# Patient Record
Sex: Male | Born: 1960
Health system: Southern US, Community
[De-identification: ages and names within clinical notes are randomized; demographics above are authoritative.]

## PROBLEM LIST (undated history)

## (undated) DIAGNOSIS — R4586 Emotional lability: Secondary | ICD-10-CM

## (undated) DIAGNOSIS — E785 Hyperlipidemia, unspecified: Secondary | ICD-10-CM

## (undated) DIAGNOSIS — G4733 Obstructive sleep apnea (adult) (pediatric): Secondary | ICD-10-CM

## (undated) DIAGNOSIS — C44301 Unspecified malignant neoplasm of skin of nose: Secondary | ICD-10-CM

## (undated) DIAGNOSIS — E119 Type 2 diabetes mellitus without complications: Secondary | ICD-10-CM

## (undated) DIAGNOSIS — Z8042 Family history of malignant neoplasm of prostate: Secondary | ICD-10-CM

## (undated) DIAGNOSIS — N529 Male erectile dysfunction, unspecified: Secondary | ICD-10-CM

## (undated) DIAGNOSIS — I10 Essential (primary) hypertension: Secondary | ICD-10-CM

## (undated) DIAGNOSIS — G4700 Insomnia, unspecified: Secondary | ICD-10-CM

## (undated) DIAGNOSIS — M545 Low back pain, unspecified: Secondary | ICD-10-CM

## (undated) HISTORY — DX: Obstructive sleep apnea (adult) (pediatric): G47.33

## (undated) HISTORY — DX: Emotional lability: R45.86

## (undated) HISTORY — DX: Family history of malignant neoplasm of prostate: Z80.42

## (undated) HISTORY — DX: Low back pain: M54.5

## (undated) HISTORY — DX: Insomnia, unspecified: G47.00

## (undated) HISTORY — DX: Low back pain, unspecified: M54.50

## (undated) HISTORY — DX: Essential (primary) hypertension: I10

## (undated) HISTORY — PX: OTHER SURGICAL HISTORY: SHX169

## (undated) HISTORY — DX: Type 2 diabetes mellitus without complications: E11.9

## (undated) HISTORY — PX: LUMBAR FUSION: SHX111

## (undated) HISTORY — DX: Unspecified malignant neoplasm of skin of nose: C44.301

## (undated) HISTORY — PX: LUMBAR LAMINECTOMY: SHX95

## (undated) HISTORY — DX: Male erectile dysfunction, unspecified: N52.9

## (undated) HISTORY — DX: Hyperlipidemia, unspecified: E78.5

## (undated) HISTORY — PX: ARTHROSCOPY KNEE W/ DRILLING: SUR92

## (undated) HISTORY — PX: SKIN CANCER EXCISION: SHX779

---

## 2000-11-25 ENCOUNTER — Ambulatory Visit (HOSPITAL_BASED_OUTPATIENT_CLINIC_OR_DEPARTMENT_OTHER): Admission: RE | Admit: 2000-11-25 | Discharge: 2000-11-25 | Payer: Self-pay | Admitting: Critical Care Medicine

## 2004-09-09 ENCOUNTER — Ambulatory Visit: Payer: Self-pay | Admitting: Internal Medicine

## 2004-10-01 ENCOUNTER — Emergency Department (HOSPITAL_COMMUNITY): Admission: EM | Admit: 2004-10-01 | Discharge: 2004-10-01 | Payer: Self-pay | Admitting: Emergency Medicine

## 2004-10-13 ENCOUNTER — Ambulatory Visit: Payer: Self-pay | Admitting: Internal Medicine

## 2004-12-08 ENCOUNTER — Ambulatory Visit: Payer: Self-pay | Admitting: Internal Medicine

## 2005-03-02 ENCOUNTER — Ambulatory Visit: Payer: Self-pay | Admitting: Internal Medicine

## 2005-03-09 ENCOUNTER — Ambulatory Visit: Payer: Self-pay | Admitting: Internal Medicine

## 2005-03-22 ENCOUNTER — Encounter: Admission: RE | Admit: 2005-03-22 | Discharge: 2005-06-20 | Payer: Self-pay | Admitting: Internal Medicine

## 2005-05-31 ENCOUNTER — Ambulatory Visit: Payer: Self-pay | Admitting: Internal Medicine

## 2005-06-07 ENCOUNTER — Ambulatory Visit: Payer: Self-pay | Admitting: Internal Medicine

## 2005-07-01 ENCOUNTER — Encounter: Admission: RE | Admit: 2005-07-01 | Discharge: 2005-08-15 | Payer: Self-pay | Admitting: Internal Medicine

## 2005-09-07 ENCOUNTER — Ambulatory Visit: Payer: Self-pay | Admitting: Internal Medicine

## 2005-09-14 ENCOUNTER — Ambulatory Visit: Payer: Self-pay | Admitting: Internal Medicine

## 2006-04-11 ENCOUNTER — Ambulatory Visit: Payer: Self-pay | Admitting: Internal Medicine

## 2006-04-22 ENCOUNTER — Ambulatory Visit: Payer: Self-pay | Admitting: Internal Medicine

## 2006-08-16 DIAGNOSIS — C44301 Unspecified malignant neoplasm of skin of nose: Secondary | ICD-10-CM

## 2006-08-16 HISTORY — DX: Unspecified malignant neoplasm of skin of nose: C44.301

## 2006-10-14 ENCOUNTER — Ambulatory Visit: Payer: Self-pay | Admitting: Internal Medicine

## 2006-10-14 LAB — CONVERTED CEMR LAB
AST: 26 units/L (ref 0–37)
Alkaline Phosphatase: 62 units/L (ref 39–117)
BUN: 12 mg/dL (ref 6–23)
Basophils Absolute: 0 10*3/uL (ref 0.0–0.1)
Basophils Relative: 0.5 % (ref 0.0–1.0)
Bilirubin Urine: NEGATIVE
Bilirubin, Direct: 0.1 mg/dL (ref 0.0–0.3)
CO2: 29 meq/L (ref 19–32)
Calcium: 8.5 mg/dL (ref 8.4–10.5)
Chloride: 103 meq/L (ref 96–112)
GFR calc Af Amer: 116 mL/min
Glucose, Bld: 177 mg/dL — ABNORMAL HIGH (ref 70–99)
HDL: 34.9 mg/dL — ABNORMAL LOW (ref >39.0)
Hemoglobin: 15.5 g/dL (ref 13.0–17.0)
Hgb A1c MFr Bld: 7.3 % — ABNORMAL HIGH (ref 4.6–6.0)
Leukocytes, UA: NEGATIVE
Lymphocytes Relative: 29.3 % (ref 12.0–46.0)
MCHC: 34.7 g/dL (ref 30.0–36.0)
Monocytes Absolute: 0.5 10*3/uL (ref 0.2–0.7)
PSA: 0.56 ng/mL (ref 0.10–4.00)
Platelets: 168 10*3/uL (ref 150–400)
Potassium: 4.2 meq/L (ref 3.5–5.1)
Sodium: 137 meq/L (ref 135–145)
Specific Gravity, Urine: 1.02 (ref 1.000–1.03)
TSH: 0.89 microintl units/mL (ref 0.35–5.50)
Total Protein, Urine: NEGATIVE mg/dL
Total Protein: 6.7 g/dL (ref 6.0–8.3)
Triglycerides: 150 mg/dL — ABNORMAL HIGH (ref 0–149)
Urine Glucose: 500 mg/dL — AB
Urobilinogen, UA: 0.2 (ref 0.0–1.0)
pH: 7 (ref 5.0–8.0)

## 2006-10-21 ENCOUNTER — Ambulatory Visit: Payer: Self-pay | Admitting: Internal Medicine

## 2007-02-15 ENCOUNTER — Ambulatory Visit: Payer: Self-pay | Admitting: Internal Medicine

## 2007-02-15 LAB — CONVERTED CEMR LAB
Chloride: 102 meq/L (ref 96–112)
Creatinine, Ser: 0.8 mg/dL (ref 0.4–1.5)

## 2007-02-21 ENCOUNTER — Ambulatory Visit: Payer: Self-pay | Admitting: Internal Medicine

## 2007-05-27 ENCOUNTER — Encounter: Payer: Self-pay | Admitting: Internal Medicine

## 2007-06-21 ENCOUNTER — Ambulatory Visit: Payer: Self-pay | Admitting: Internal Medicine

## 2007-06-21 LAB — CONVERTED CEMR LAB
AST: 54 units/L — ABNORMAL HIGH (ref 0–37)
Albumin: 3.9 g/dL (ref 3.5–5.2)
BUN: 13 mg/dL (ref 6–23)
CO2: 30 meq/L (ref 19–32)
Calcium: 8.7 mg/dL (ref 8.4–10.5)
Chloride: 104 meq/L (ref 96–112)
Creatinine, Ser: 0.8 mg/dL (ref 0.4–1.5)
Direct LDL: 143.2 mg/dL
Glucose, Bld: 190 mg/dL — ABNORMAL HIGH (ref 70–99)
HDL: 32.6 mg/dL — ABNORMAL LOW (ref >39.0)
Sodium: 142 meq/L (ref 135–145)
Total Bilirubin: 1 mg/dL (ref 0.3–1.2)
Total CHOL/HDL Ratio: 6.2
Total Protein: 6.8 g/dL (ref 6.0–8.3)
Triglycerides: 177 mg/dL — ABNORMAL HIGH (ref 0–149)
VLDL: 35 mg/dL (ref 0–40)

## 2007-06-27 ENCOUNTER — Ambulatory Visit: Payer: Self-pay | Admitting: Internal Medicine

## 2007-06-27 DIAGNOSIS — N529 Male erectile dysfunction, unspecified: Secondary | ICD-10-CM | POA: Insufficient documentation

## 2007-06-27 DIAGNOSIS — E785 Hyperlipidemia, unspecified: Secondary | ICD-10-CM | POA: Insufficient documentation

## 2007-09-18 ENCOUNTER — Ambulatory Visit: Payer: Self-pay | Admitting: Internal Medicine

## 2007-09-25 ENCOUNTER — Ambulatory Visit: Payer: Self-pay | Admitting: Internal Medicine

## 2007-09-25 DIAGNOSIS — I10 Essential (primary) hypertension: Secondary | ICD-10-CM | POA: Insufficient documentation

## 2007-09-25 LAB — CONVERTED CEMR LAB
ALT: 66 units/L — ABNORMAL HIGH (ref 0–53)
AST: 96 units/L — ABNORMAL HIGH (ref 0–37)
Bilirubin, Direct: 0.2 mg/dL (ref 0.0–0.3)
CO2: 27 meq/L (ref 19–32)
Calcium: 8.8 mg/dL (ref 8.4–10.5)
Cholesterol: 139 mg/dL (ref 0–200)
Creatinine, Ser: 0.8 mg/dL (ref 0.4–1.5)
Glucose, Bld: 229 mg/dL — ABNORMAL HIGH (ref 70–99)
Hgb A1c MFr Bld: 8.8 % — ABNORMAL HIGH (ref 4.6–6.0)
LDL Cholesterol: 81 mg/dL (ref 0–99)
Potassium: 3.3 meq/L — ABNORMAL LOW (ref 3.5–5.1)
Total Protein: 6.5 g/dL (ref 6.0–8.3)

## 2008-01-09 ENCOUNTER — Encounter: Payer: Self-pay | Admitting: Internal Medicine

## 2008-01-09 ENCOUNTER — Ambulatory Visit: Payer: Self-pay | Admitting: Internal Medicine

## 2008-01-09 DIAGNOSIS — J189 Pneumonia, unspecified organism: Secondary | ICD-10-CM | POA: Insufficient documentation

## 2008-01-09 DIAGNOSIS — R05 Cough: Secondary | ICD-10-CM

## 2008-01-09 DIAGNOSIS — R059 Cough, unspecified: Secondary | ICD-10-CM | POA: Insufficient documentation

## 2008-01-09 DIAGNOSIS — R509 Fever, unspecified: Secondary | ICD-10-CM | POA: Insufficient documentation

## 2008-01-22 ENCOUNTER — Ambulatory Visit: Payer: Self-pay | Admitting: Internal Medicine

## 2008-01-22 LAB — CONVERTED CEMR LAB
Calcium: 8.8 mg/dL (ref 8.4–10.5)
Creatinine, Ser: 0.9 mg/dL (ref 0.4–1.5)
GFR calc Af Amer: 116 mL/min
Hgb A1c MFr Bld: 8.7 % — ABNORMAL HIGH (ref 4.6–6.0)
Potassium: 3.7 meq/L (ref 3.5–5.1)

## 2008-01-26 ENCOUNTER — Encounter (INDEPENDENT_AMBULATORY_CARE_PROVIDER_SITE_OTHER): Payer: Self-pay | Admitting: *Deleted

## 2008-01-26 ENCOUNTER — Ambulatory Visit: Payer: Self-pay | Admitting: Internal Medicine

## 2008-01-26 DIAGNOSIS — D485 Neoplasm of uncertain behavior of skin: Secondary | ICD-10-CM | POA: Insufficient documentation

## 2008-02-12 ENCOUNTER — Telehealth: Payer: Self-pay | Admitting: Internal Medicine

## 2008-02-12 ENCOUNTER — Encounter: Payer: Self-pay | Admitting: Internal Medicine

## 2008-02-12 ENCOUNTER — Ambulatory Visit: Payer: Self-pay | Admitting: Cardiology

## 2008-02-12 ENCOUNTER — Ambulatory Visit: Payer: Self-pay | Admitting: Internal Medicine

## 2008-02-12 DIAGNOSIS — R1031 Right lower quadrant pain: Secondary | ICD-10-CM | POA: Insufficient documentation

## 2008-03-01 ENCOUNTER — Encounter: Payer: Self-pay | Admitting: Internal Medicine

## 2008-05-09 ENCOUNTER — Encounter (INDEPENDENT_AMBULATORY_CARE_PROVIDER_SITE_OTHER): Payer: Self-pay | Admitting: *Deleted

## 2008-06-21 ENCOUNTER — Ambulatory Visit: Payer: Self-pay | Admitting: Internal Medicine

## 2008-06-25 LAB — CONVERTED CEMR LAB
Chloride: 106 meq/L (ref 96–112)
GFR calc Af Amer: 115 mL/min

## 2008-06-26 ENCOUNTER — Ambulatory Visit: Payer: Self-pay | Admitting: Internal Medicine

## 2008-10-23 ENCOUNTER — Ambulatory Visit: Payer: Self-pay | Admitting: Internal Medicine

## 2008-10-24 LAB — CONVERTED CEMR LAB
Alkaline Phosphatase: 50 units/L (ref 39–117)
Basophils Absolute: 0 10*3/uL (ref 0.0–0.1)
Bilirubin Urine: NEGATIVE
Bilirubin, Direct: 0.2 mg/dL (ref 0.0–0.3)
Calcium: 8.8 mg/dL (ref 8.4–10.5)
Cholesterol: 130 mg/dL (ref 0–200)
Creatinine, Ser: 0.9 mg/dL (ref 0.4–1.5)
GFR calc Af Amer: 115 mL/min
GFR calc non Af Amer: 95 mL/min
Hemoglobin, Urine: NEGATIVE
Hemoglobin: 14.9 g/dL (ref 13.0–17.0)
Hgb A1c MFr Bld: 7.2 % — ABNORMAL HIGH (ref 4.6–6.0)
Ketones, ur: NEGATIVE mg/dL
LDL Cholesterol: 77 mg/dL (ref 0–99)
Leukocytes, UA: NEGATIVE
Lymphocytes Relative: 29.6 % (ref 12.0–46.0)
Monocytes Relative: 5.1 % (ref 3.0–12.0)
Nitrite: NEGATIVE
Platelets: 154 10*3/uL (ref 150–400)
RDW: 12.1 % (ref 11.5–14.6)
Specific Gravity, Urine: >=1.03 (ref 1.000–1.035)
Total Bilirubin: 1.4 mg/dL — ABNORMAL HIGH (ref 0.3–1.2)
Total CHOL/HDL Ratio: 4.3
Triglycerides: 118 mg/dL (ref 0–149)

## 2008-10-28 ENCOUNTER — Ambulatory Visit: Payer: Self-pay | Admitting: Internal Medicine

## 2009-01-29 IMAGING — CT CT ABDOMEN W/ CM
2 of 5 series · 17 of 46 positions shown, 19 images · IV contrast (omnipaque)
Comparison: None

CT ABDOMEN

CLINICAL DATA: Right lower quadrant pain.  Pain radiates to
suprapubic area.  Nausea, diarrhea for 1 day.  History of L-spine
surgery.

CT ABDOMEN AND PELVIS WITH CONTRAST
TECHNIQUE: Multidetector CT imaging of the abdomen and pelvis was
performed using the standard protocol following bolus
administration of intravenous contrast.
Contrast: 125 ml of Omnipaque-X00.

[Series 2: abd_pel_xxl 5.0 b10f st · axial · 0.92mm/px · z∈[-486,-62]mm · 14 of 97 slices shown, 16 images]
[im 6/97  soft-tissue]
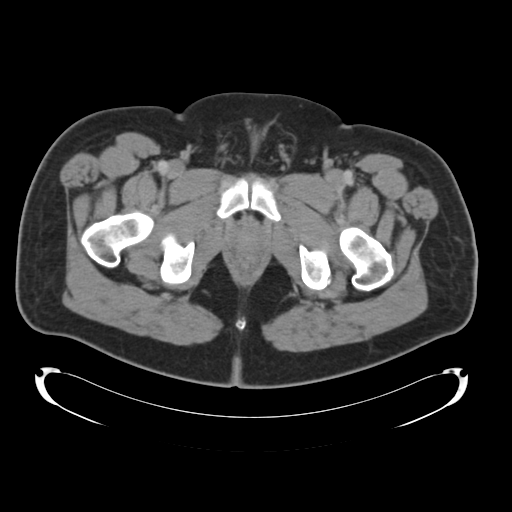
[im 6/97  bone]
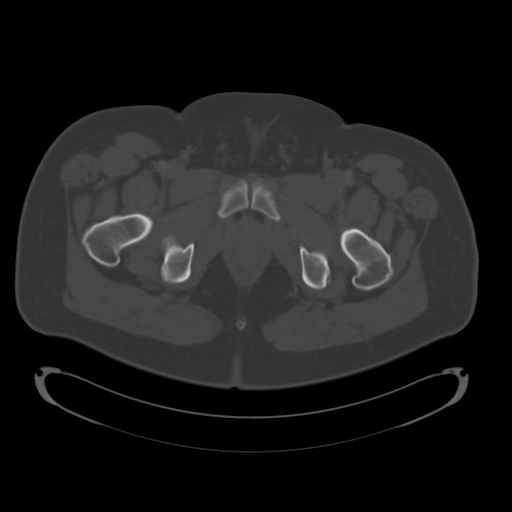
[im 11/97  soft-tissue]
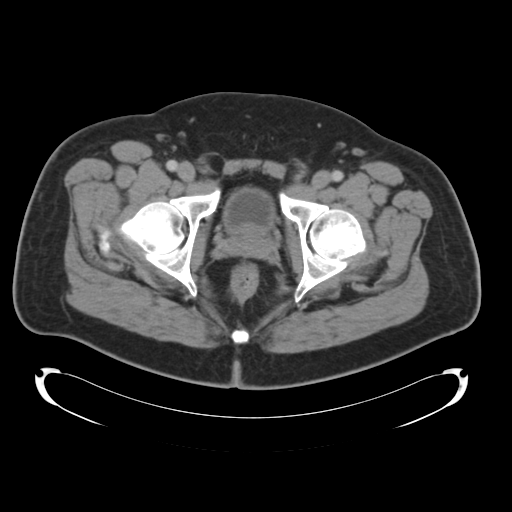
[im 22/97  soft-tissue]
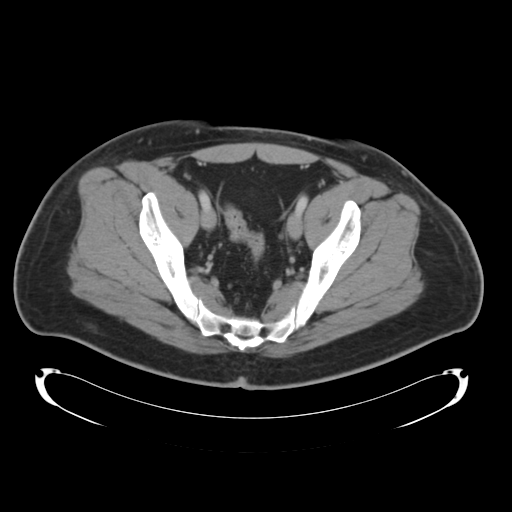
[im 27/97  soft-tissue]
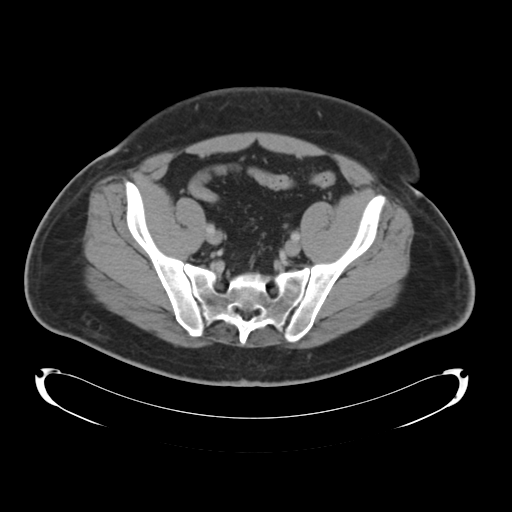
[im 33/97  soft-tissue]
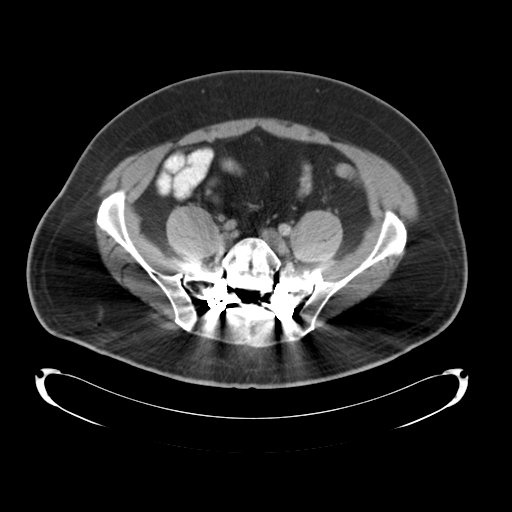
[im 38/97  soft-tissue]
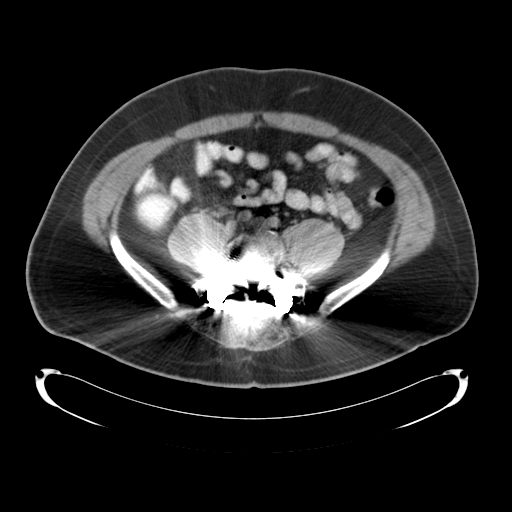
[im 43/97  soft-tissue]
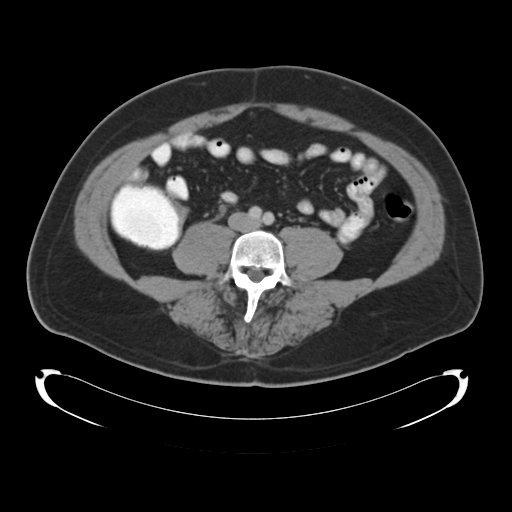
[im 54/97  soft-tissue]
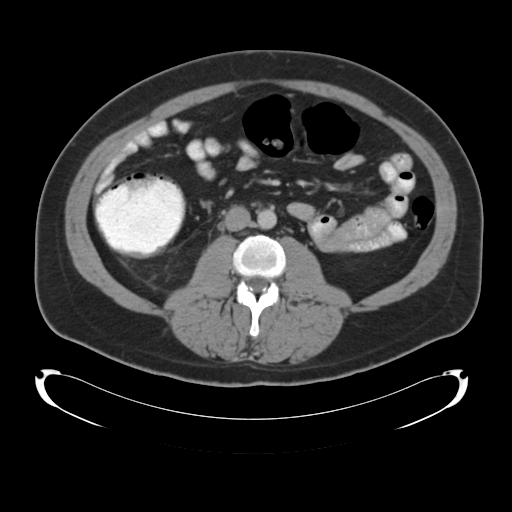
[im 59/97  soft-tissue]
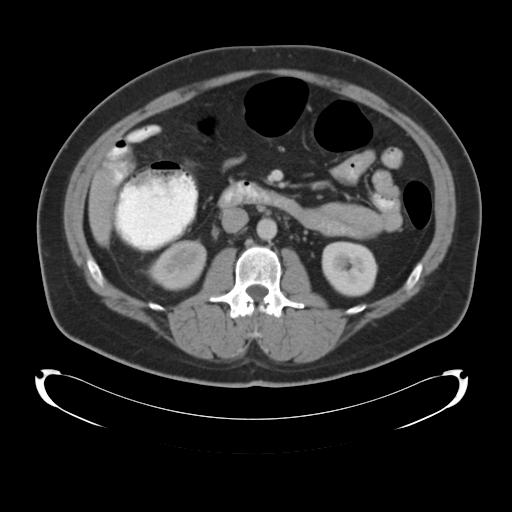
[im 59/97  bone]
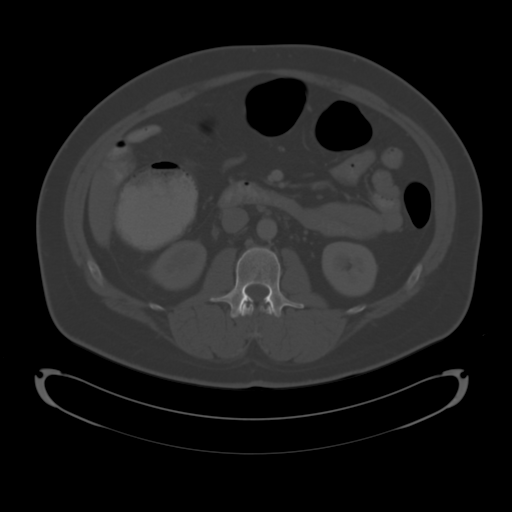
[im 65/97  soft-tissue]
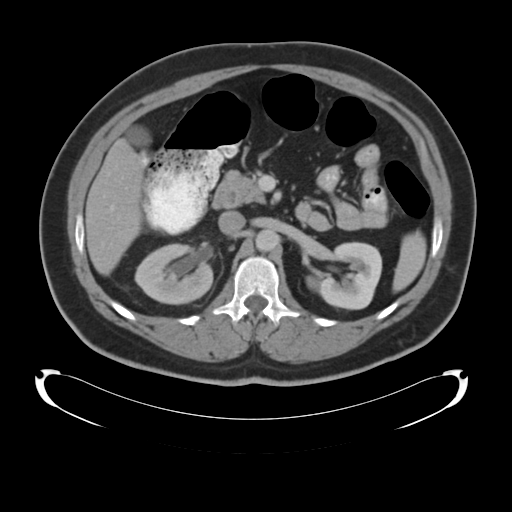
[im 70/97  soft-tissue]
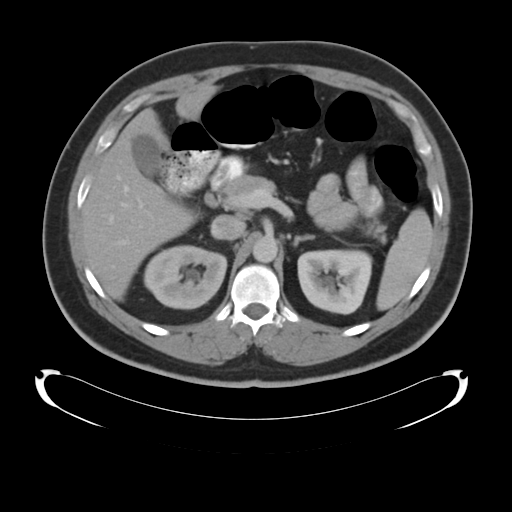
[im 75/97  soft-tissue]
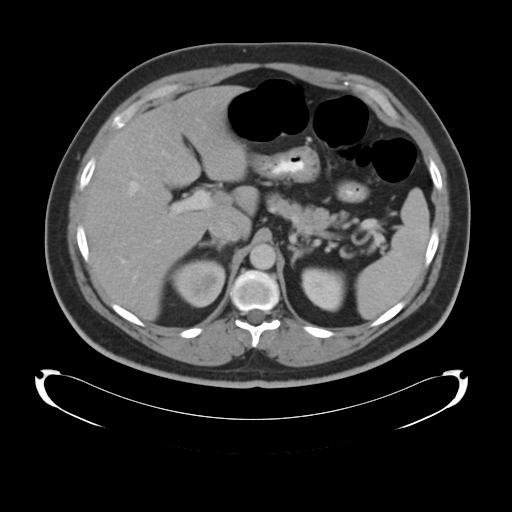
[im 86/97  soft-tissue]
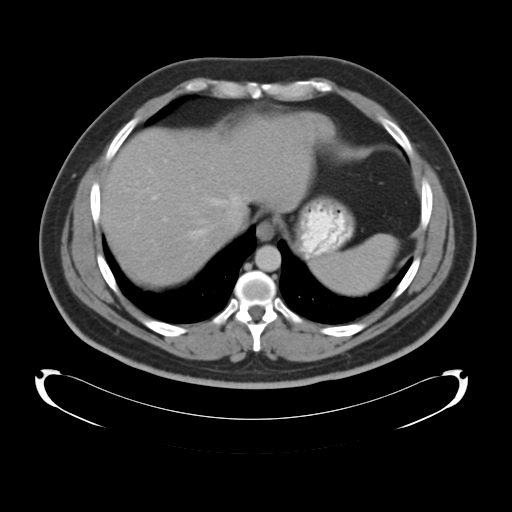
[im 91/97  soft-tissue]
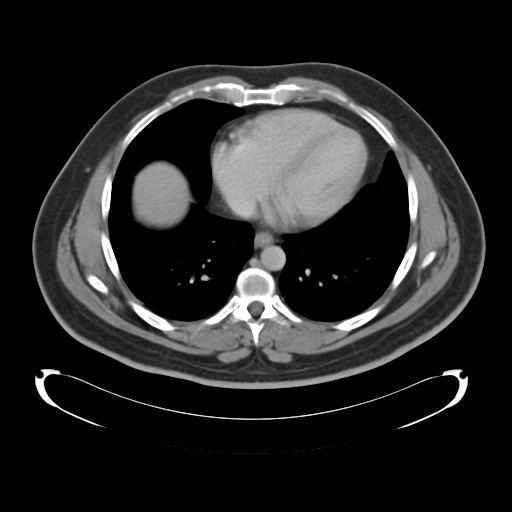

[Series 602: <mpr thick range> · coronal · 0.97mm/px · 3 of 93 slices shown]
[im 31/93  soft-tissue]
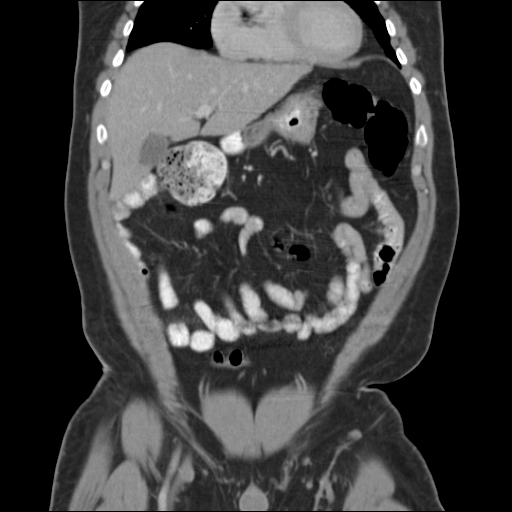
[im 41/93  soft-tissue]
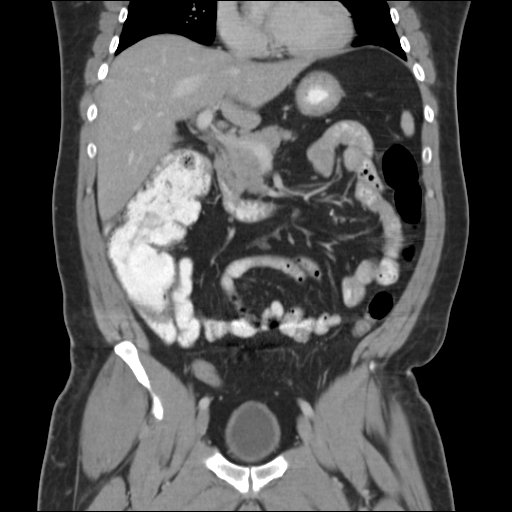
[im 52/93  soft-tissue]
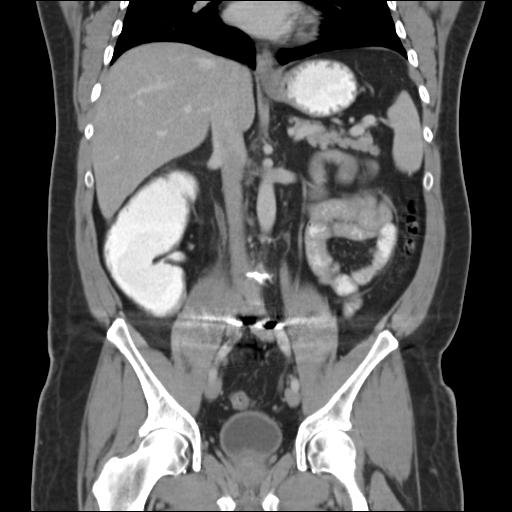

[17 of 46 positions shown; findings below may reference images not displayed]

FINDINGS: Images of the lung bases are unremarkable.  No focal
abnormalities identified within the liver, spleen, pancreas, or
adrenal glands.

There is a 4 mm calculus within the right mid ureter, at the level
of L5-S1.  There is mild right hydronephrosis.  Right peri ureteral
and perinephric stranding is noted.  There is a delayed right
nephrogram on the delayed images.

The gallbladder is present.  There is no retroperitoneal adenopathy
or ascites.
IMPRESSION: 4 mm right mid ureteral calculus.

CT PELVIS
FINDINGS: The distal right ureter and urinary bladder have normal
appearance.  The appendix is well seen and has a normal appearance.
There is streak artifact from prior lumbar spine surgery.  No
pelvic adenopathy or ascites is noted.
IMPRESSION: No evidence for acute pelvic abnormality.  Right mid ureteral
calculus is described above.

## 2009-02-21 ENCOUNTER — Ambulatory Visit: Payer: Self-pay | Admitting: Internal Medicine

## 2009-02-21 LAB — CONVERTED CEMR LAB
BUN: 12 mg/dL (ref 6–23)
CO2: 30 meq/L (ref 19–32)
Chloride: 102 meq/L (ref 96–112)
Creatinine, Ser: 0.9 mg/dL (ref 0.4–1.5)
GFR calc non Af Amer: 94.99 mL/min (ref >60)

## 2009-02-28 ENCOUNTER — Ambulatory Visit: Payer: Self-pay | Admitting: Internal Medicine

## 2009-02-28 DIAGNOSIS — M545 Low back pain, unspecified: Secondary | ICD-10-CM | POA: Insufficient documentation

## 2009-06-26 ENCOUNTER — Ambulatory Visit: Payer: Self-pay | Admitting: Internal Medicine

## 2009-06-26 LAB — CONVERTED CEMR LAB
Calcium: 8.9 mg/dL (ref 8.4–10.5)
Glucose, Bld: 154 mg/dL — ABNORMAL HIGH (ref 70–99)

## 2009-07-01 ENCOUNTER — Ambulatory Visit: Payer: Self-pay | Admitting: Internal Medicine

## 2009-10-28 ENCOUNTER — Ambulatory Visit: Payer: Self-pay | Admitting: Internal Medicine

## 2009-10-28 LAB — CONVERTED CEMR LAB
ALT: 36 units/L (ref 0–53)
Albumin: 4.1 g/dL (ref 3.5–5.2)
Alkaline Phosphatase: 58 units/L (ref 39–117)
BUN: 9 mg/dL (ref 6–23)
Bilirubin, Direct: 0.2 mg/dL (ref 0.0–0.3)
CO2: 31 meq/L (ref 19–32)
Chloride: 105 meq/L (ref 96–112)
Cholesterol: 130 mg/dL (ref 0–200)
Creatinine, Ser: 0.9 mg/dL (ref 0.4–1.5)
Eosinophils Relative: 3.4 % (ref 0.0–5.0)
HDL: 37.2 mg/dL — ABNORMAL LOW (ref >39.00)
Hemoglobin, Urine: NEGATIVE
Hemoglobin: 14.7 g/dL (ref 13.0–17.0)
Hgb A1c MFr Bld: 7.2 % — ABNORMAL HIGH (ref 4.6–6.5)
Ketones, ur: NEGATIVE mg/dL
Leukocytes, UA: NEGATIVE
Lymphocytes Relative: 34.4 % (ref 12.0–46.0)
Lymphs Abs: 2.5 10*3/uL (ref 0.7–4.0)
MCV: 87.7 fL (ref 78.0–100.0)
PSA: 0.76 ng/mL (ref 0.10–4.00)
Platelets: 147 10*3/uL — ABNORMAL LOW (ref 150.0–400.0)
Potassium: 3.7 meq/L (ref 3.5–5.1)
RBC: 4.95 M/uL (ref 4.22–5.81)
Specific Gravity, Urine: >=1.03 (ref 1.000–1.030)
Total Protein, Urine: NEGATIVE mg/dL
Total Protein: 7.1 g/dL (ref 6.0–8.3)
Urobilinogen, UA: 0.2 (ref 0.0–1.0)

## 2009-11-04 ENCOUNTER — Ambulatory Visit: Payer: Self-pay | Admitting: Internal Medicine

## 2009-11-04 DIAGNOSIS — R040 Epistaxis: Secondary | ICD-10-CM | POA: Insufficient documentation

## 2009-11-04 DIAGNOSIS — Z87891 Personal history of nicotine dependence: Secondary | ICD-10-CM | POA: Insufficient documentation

## 2009-11-21 ENCOUNTER — Encounter: Payer: Self-pay | Admitting: Internal Medicine

## 2010-03-09 ENCOUNTER — Ambulatory Visit: Payer: Self-pay | Admitting: Internal Medicine

## 2010-03-09 LAB — CONVERTED CEMR LAB
GFR calc non Af Amer: 111.58 mL/min (ref >60)
Hgb A1c MFr Bld: 7.3 % — ABNORMAL HIGH (ref 4.6–6.5)
Potassium: 3.5 meq/L (ref 3.5–5.1)
Triglycerides: 265 mg/dL — ABNORMAL HIGH (ref 0.0–149.0)
VLDL: 53 mg/dL — ABNORMAL HIGH (ref 0.0–40.0)

## 2010-03-12 ENCOUNTER — Ambulatory Visit: Payer: Self-pay | Admitting: Internal Medicine

## 2010-07-08 ENCOUNTER — Ambulatory Visit: Payer: Self-pay | Admitting: Internal Medicine

## 2010-07-12 LAB — CONVERTED CEMR LAB
ALT: 43 units/L (ref 0–53)
AST: 57 units/L — ABNORMAL HIGH (ref 0–37)
Albumin: 4.1 g/dL (ref 3.5–5.2)
Alkaline Phosphatase: 68 units/L (ref 39–117)
Bilirubin, Direct: 0.2 mg/dL (ref 0.0–0.3)
Calcium: 8.8 mg/dL (ref 8.4–10.5)
Chloride: 104 meq/L (ref 96–112)
HDL: 31.9 mg/dL — ABNORMAL LOW (ref >39.00)
Hgb A1c MFr Bld: 7.8 % — ABNORMAL HIGH (ref 4.6–6.5)
Potassium: 3.5 meq/L (ref 3.5–5.1)
VLDL: 37 mg/dL (ref 0.0–40.0)

## 2010-07-15 ENCOUNTER — Ambulatory Visit: Payer: Self-pay | Admitting: Internal Medicine

## 2010-07-24 ENCOUNTER — Telehealth: Payer: Self-pay | Admitting: Internal Medicine

## 2010-09-13 LAB — CONVERTED CEMR LAB
ALT: 31 units/L (ref 0–53)
AST: 34 units/L (ref 0–37)
Alkaline Phosphatase: 61 units/L (ref 39–117)
BUN: 10 mg/dL (ref 6–23)
Basophils Absolute: 0 10*3/uL (ref 0.0–0.1)
CO2: 30 meq/L (ref 19–32)
Chloride: 102 meq/L (ref 96–112)
Eosinophils Absolute: 0.1 10*3/uL (ref 0.0–0.7)
Eosinophils Relative: 0.6 % (ref 0.0–5.0)
GFR calc non Af Amer: 57 mL/min
Glucose, Bld: 144 mg/dL — ABNORMAL HIGH (ref 70–99)
HCT: 43.5 % (ref 39.0–52.0)
Leukocytes, UA: NEGATIVE
MCV: 85.6 fL (ref 78.0–100.0)
Neutro Abs: 10.4 10*3/uL — ABNORMAL HIGH (ref 1.4–7.7)
Nitrite: NEGATIVE
Potassium: 3.9 meq/L (ref 3.5–5.1)
Sed Rate: 15 mm/hr (ref 0–16)
Sodium: 140 meq/L (ref 135–145)
Total Protein, Urine: NEGATIVE mg/dL
Total Protein: 6.9 g/dL (ref 6.0–8.3)
Urine Glucose: NEGATIVE mg/dL
WBC: 13 10*3/uL — ABNORMAL HIGH (ref 4.5–10.5)
pH: 5.5 (ref 5.0–8.0)

## 2010-09-15 NOTE — Assessment & Plan Note (Signed)
Summary: 4 mo rov /nws  #   Vital Signs:  Patient profile:   50 year old male Height:      69 inches Weight:      257 pounds BMI:     38.09 Temp:     98.3 degrees F oral Pulse rate:   68 / minute Pulse rhythm:   regular Resp:     16 per minute BP sitting:   116 / 84  (left arm) Cuff size:   large  Vitals Entered By: Lanier Prude, CMA(AAMA) (July 15, 2010 7:51 AM) CC: 4 mo f/u  Is Patient Diabetic? Yes Comments pt is not taking Vicodin, Vit d or Flonase.  He needs Rf on Ziac   CC:  4 mo f/u .  History of Present Illness: The patient presents for a follow up of hypertension, diabetes, hyperlipidemia   Current Medications (verified): 1)  Ziac 10-6.25 Mg  Tabs (Bisoprolol-Hydrochlorothiazide) .... Once Daily 2)  Ibuprofen 600 Mg  Tabs (Ibuprofen) .Marland Kitchen.. 1 By Mouth Two Times A Day Prn 3)  Cialis 20 Mg Tabs (Tadalafil) .... Q3d As Needed 4)  Baby Aspirin 81 Mg  Chew (Aspirin) .... Once Daily 5)  Tylenol 325 Mg  Tabs (Acetaminophen) .... As Needed 6)  Glucophage 1000 Mg  Tabs (Metformin Hcl) .... One By Mouth Twice A Day 7)  Glimepiride 2 Mg Tabs (Glimepiride) .... 1/2 or 1 By Mouth Once Daily 8)  Hydrocodone-Acetaminophen 10-500 Mg  Tabs (Hydrocodone-Acetaminophen) .Marland Kitchen.. 1-2 By Mouth Qid As Needed Pain 9)  Vitamin D3 1000 Unit  Tabs (Cholecalciferol) .Marland Kitchen.. 1 By Mouth Daily 10)  Flonase 50 Mcg/act Susp (Fluticasone Propionate) .Marland Kitchen.. 1 Spr Each Nostr Qd As Needed 11)  Lipitor 20 Mg Tabs (Atorvastatin Calcium) .Marland Kitchen.. 1 By Mouth Once Daily For Cholesterol 12)  Multi-Day   Tabs (Multiple Vitamin) .Marland Kitchen.. 1 Once Daily  Allergies (verified): 1)  ! Morphine Sulfate (Morphine Sulfate)  Past History:  Past Medical History: Last updated: 11/04/2009 insomnia family hx of prostate ca mood swings OSA Diabetes mellitus, type II Hyperlipidemia Hypertension ED Low back pain Skin Ca on L nose 2008  Social History: Last updated: 11/04/2009 Occupation: Married Former Smoker long  time ago Regular exercise-no  Physical Exam  General:   overweight-appearing.   Nose:  External nasal examination shows no deformity or inflammation. Nasal mucosa are pink and moist without lesions or exudates. Mouth:  Oral mucosa and oropharynx without lesions or exudates.  Teeth in good repair. Lungs:  Normal respiratory effort, chest expands symmetrically. Lungs are clear to auscultation, no crackles or wheezes. Heart:  Normal rate and regular rhythm. S1 and S2 normal without gallop, murmur, click, rub or other extra sounds. Abdomen:  Bowel sounds positive,abdomen soft and non-tender without masses, organomegaly or hernias noted. Prostate:  Prostate gland firm and smooth, no enlargement, nodularity, tenderness, mass, asymmetry or induration. Msk:  No deformity or scoliosis noted of thoracic or lumbar spine.   Neurologic:  No cranial nerve deficits noted. Station and gait are normal. Plantar reflexes are down-going bilaterally. DTRs are symmetrical throughout. Sensory, motor and coordinative functions appear intact. Skin:  Intact without suspicious lesions or rashes Psych:  Cognition and judgment appear intact. Alert and cooperative with normal attention span and concentration. No apparent delusions, illusions, hallucinations   Impression & Recommendations:  Problem # 1:  DIABETES MELLITUS, TYPE II (ICD-250.00) Assessment Deteriorated Loose wt, gym The following medications were removed from the medication list:    Glucophage 1000 Mg Tabs (Metformin hcl) .Marland KitchenMarland KitchenMarland KitchenMarland Kitchen  One by mouth twice a day His updated medication list for this problem includes:    Baby Aspirin 81 Mg Chew (Aspirin) ..... Once daily    Glimepiride 2 Mg Tabs (Glimepiride) .Marland Kitchen... 1 by mouth once daily    Kombiglyze Xr 2.12-998 Mg Xr24h-tab (Saxagliptin-metformin) .Marland Kitchen... 1 by mouth bid  Problem # 2:  ESSENTIAL HYPERTENSION, BENIGN (ICD-401.1) Assessment: Improved  His updated medication list for this problem includes:    Ziac  10-6.25 Mg Tabs (Bisoprolol-hydrochlorothiazide) ..... Once daily  BP today: 116/84 Prior BP: 128/88 (03/12/2010)  Labs Reviewed: K+: 3.5 (07/08/2010) Creat: : 0.8 (07/08/2010)   Chol: 149 (07/08/2010)   HDL: 31.90 (07/08/2010)   LDL: 80 (07/08/2010)   TG: 185.0 (07/08/2010)  Problem # 3:  LOW BACK PAIN (ICD-724.2) Assessment: Unchanged  His updated medication list for this problem includes:    Ibuprofen 600 Mg Tabs (Ibuprofen) .Marland Kitchen... 1 by mouth two times a day prn    Baby Aspirin 81 Mg Chew (Aspirin) ..... Once daily    Tylenol 325 Mg Tabs (Acetaminophen) .Marland Kitchen... As needed    Hydrocodone-acetaminophen 10-500 Mg Tabs (Hydrocodone-acetaminophen) .Marland Kitchen... 1-2 by mouth qid as needed pain  Problem # 4:  HYPERLIPIDEMIA (ICD-272.4) Assessment: Unchanged  His updated medication list for this problem includes:    Lipitor 20 Mg Tabs (Atorvastatin calcium) .Marland Kitchen... 1 by mouth once daily for cholesterol  Complete Medication List: 1)  Ziac 10-6.25 Mg Tabs (Bisoprolol-hydrochlorothiazide) .... Once daily 2)  Ibuprofen 600 Mg Tabs (Ibuprofen) .Marland Kitchen.. 1 by mouth two times a day prn 3)  Cialis 20 Mg Tabs (Tadalafil) .... Q3d as needed 4)  Baby Aspirin 81 Mg Chew (Aspirin) .... Once daily 5)  Tylenol 325 Mg Tabs (Acetaminophen) .... As needed 6)  Glimepiride 2 Mg Tabs (Glimepiride) .Marland Kitchen.. 1 by mouth once daily 7)  Hydrocodone-acetaminophen 10-500 Mg Tabs (Hydrocodone-acetaminophen) .Marland Kitchen.. 1-2 by mouth qid as needed pain 8)  Vitamin D3 1000 Unit Tabs (Cholecalciferol) .Marland Kitchen.. 1 by mouth daily 9)  Flonase 50 Mcg/act Susp (Fluticasone propionate) .Marland Kitchen.. 1 spr each nostr qd as needed 10)  Lipitor 20 Mg Tabs (Atorvastatin calcium) .Marland Kitchen.. 1 by mouth once daily for cholesterol 11)  Kombiglyze Xr 2.12-998 Mg Xr24h-tab (Saxagliptin-metformin) .Marland Kitchen.. 1 by mouth bid  Patient Instructions: 1)  Please schedule a follow-up appointment in 4 months well w/labs. 2)  HbgA1C prior to visit, ICD-9:995.20  250.02 Prescriptions: ZIAC  10-6.25 MG  TABS (BISOPROLOL-HYDROCHLOROTHIAZIDE) once daily  #30 Each x 11   Entered and Authorized by:   Tresa Garter MD   Signed by:   Tresa Garter MD on 07/15/2010   Method used:   Print then Give to Patient   RxID:   [id number] KOMBIGLYZE XR 2.12-998 MG XR24H-TAB (SAXAGLIPTIN-METFORMIN) 1 by mouth bid  #60 x 11   Entered and Authorized by:   Tresa Garter MD   Signed by:   Tresa Garter MD on 07/15/2010   Method used:   Print then Give to Patient   RxID:   [id number]    Orders Added: 1)  Est. Patient Level IV [67341]

## 2010-09-15 NOTE — Consult Note (Signed)
Summary: [facility]  [facility]   Imported By: Sherian Rein 12/10/2009 08:17:46  _____________________________________________________________________  External Attachment:    Type:   Image     Comment:   External Document

## 2010-09-15 NOTE — Assessment & Plan Note (Signed)
Summary: 4 mos f/u // # / cd   Vital Signs:  Patient profile:   50 year old male Height:      69 inches Weight:      257 pounds BMI:     38.09 O2 Sat:      96 % on Room air Temp:     98.2 degrees F oral Pulse rate:   71 / minute Pulse rhythm:   regular Resp:     16 per minute BP sitting:   128 / 88  (left arm) Cuff size:   large  Vitals Entered By: Lanier Prude, CMA(AAMA) (March 12, 2010 7:51 AM)  O2 Flow:  Room air CC: 4 mo f/u Is Patient Diabetic? Yes   CC:  4 mo f/u.  Current Medications (verified): 1)  Ziac 10-6.25 Mg  Tabs (Bisoprolol-Hydrochlorothiazide) .... Once Daily 2)  Simvastatin 20 Mg Tabs (Simvastatin) .Marland Kitchen.. 1 By Mouth Once Daily 3)  Ibuprofen 600 Mg  Tabs (Ibuprofen) .Marland Kitchen.. 1 By Mouth Two Times A Day Prn 4)  Cialis 20 Mg Tabs (Tadalafil) .... Q3d As Needed 5)  Multi-Day   Tabs (Multiple Vitamin) .Marland Kitchen.. 1 Once Daily 6)  Baby Aspirin 81 Mg  Chew (Aspirin) .... Once Daily 7)  Tylenol 325 Mg  Tabs (Acetaminophen) .... As Needed 8)  Glucophage 1000 Mg  Tabs (Metformin Hcl) .... One By Mouth Twice A Day 9)  Glimepiride 2 Mg Tabs (Glimepiride) .... 1/2 or 1 By Mouth Once Daily 10)  Hydrocodone-Acetaminophen 10-500 Mg  Tabs (Hydrocodone-Acetaminophen) .Marland Kitchen.. 1-2 By Mouth Qid As Needed Pain 11)  Vitamin D3 1000 Unit  Tabs (Cholecalciferol) .Marland Kitchen.. 1 By Mouth Daily 12)  Flonase 50 Mcg/act Susp (Fluticasone Propionate) .Marland Kitchen.. 1 Spr Each Nostr Qd As Needed  Allergies (verified): 1)  ! Morphine Sulfate (Morphine Sulfate)  Review of Systems  The patient denies abdominal pain, muscle weakness, difficulty walking, and depression.    Physical Exam  General:   overweight-appearing.   Ears:  External ear exam shows no significant lesions or deformities.  Otoscopic examination reveals clear canals, tympanic membranes are intact bilaterally without bulging, retraction, inflammation or discharge. Hearing is grossly normal bilaterally. Nose:  L nostril is swollen Mouth:  Oral mucosa  and oropharynx without lesions or exudates.  Teeth in good repair. Lungs:  Normal respiratory effort, chest expands symmetrically. Lungs are clear to auscultation, no crackles or wheezes. Heart:  Normal rate and regular rhythm. S1 and S2 normal without gallop, murmur, click, rub or other extra sounds. Abdomen:  Bowel sounds positive,abdomen soft and non-tender without masses, organomegaly or hernias noted. Msk:  No deformity or scoliosis noted of thoracic or lumbar spine.   Extremities:  No clubbing, cyanosis, edema, or deformity noted with normal full range of motion of all joints.   Neurologic:  No cranial nerve deficits noted. Station and gait are normal. Plantar reflexes are down-going bilaterally. DTRs are symmetrical throughout. Sensory, motor and coordinative functions appear intact. Skin:  Intact without suspicious lesions or rashes Psych:  Cognition and judgment appear intact. Alert and cooperative with normal attention span and concentration. No apparent delusions, illusions, hallucinations   Impression & Recommendations:  Problem # 1:  DIABETES MELLITUS, TYPE II (ICD-250.00) Assessment Unchanged  His updated medication list for this problem includes:    Baby Aspirin 81 Mg Chew (Aspirin) ..... Once daily    Glucophage 1000 Mg Tabs (Metformin hcl) ..... One by mouth twice a day    Glimepiride 2 Mg Tabs (Glimepiride) .Marland Kitchen... 1/2 or  1 by mouth once daily  Problem # 2:  HYPERLIPIDEMIA (ICD-272.4) Assessment: Comment Only  The following medications were removed from the medication list:    Simvastatin 20 Mg Tabs (Simvastatin) .Marland Kitchen... 1 by mouth once daily His updated medication list for this problem includes:    Lipitor 20 Mg Tabs (Atorvastatin calcium) .Marland Kitchen... 1 by mouth once daily for cholesterol  Problem # 3:  HYPERTENSION (ICD-401.9) Assessment: Comment Only  His updated medication list for this problem includes:    Ziac 10-6.25 Mg Tabs (Bisoprolol-hydrochlorothiazide) ..... Once  daily  Problem # 4:  ERECTILE DYSFUNCTION (ICD-607.84)  His updated medication list for this problem includes:    Cialis 20 Mg Tabs (Tadalafil) ..... Q3d as needed  Complete Medication List: 1)  Ziac 10-6.25 Mg Tabs (Bisoprolol-hydrochlorothiazide) .... Once daily 2)  Ibuprofen 600 Mg Tabs (Ibuprofen) .Marland Kitchen.. 1 by mouth two times a day prn 3)  Cialis 20 Mg Tabs (Tadalafil) .... Q3d as needed 4)  Baby Aspirin 81 Mg Chew (Aspirin) .... Once daily 5)  Tylenol 325 Mg Tabs (Acetaminophen) .... As needed 6)  Glucophage 1000 Mg Tabs (Metformin hcl) .... One by mouth twice a day 7)  Glimepiride 2 Mg Tabs (Glimepiride) .... 1/2 or 1 by mouth once daily 8)  Hydrocodone-acetaminophen 10-500 Mg Tabs (Hydrocodone-acetaminophen) .Marland Kitchen.. 1-2 by mouth qid as needed pain 9)  Vitamin D3 1000 Unit Tabs (Cholecalciferol) .Marland Kitchen.. 1 by mouth daily 10)  Flonase 50 Mcg/act Susp (Fluticasone propionate) .Marland Kitchen.. 1 spr each nostr qd as needed 11)  Lipitor 20 Mg Tabs (Atorvastatin calcium) .Marland Kitchen.. 1 by mouth once daily for cholesterol 12)  Multi-day Tabs (Multiple vitamin) .Marland Kitchen.. 1 once daily  Patient Instructions: 1)  Please schedule a follow-up appointment in 4 months. 2)  BMP prior to visit, ICD-9: 3)  Hepatic Panel prior to visit, ICD-9: 4)  Lipid Panel prior to visit, ICD-9: 5)  HbgA1C prior to visit, ICD-9:250.00  272.0 Prescriptions: IBUPROFEN 600 MG  TABS (IBUPROFEN) 1 by mouth two times a day prn  #60 Each x 5   Entered and Authorized by:   Tresa Garter MD   Signed by:   Tresa Garter MD on 03/12/2010   Method used:   Print then Give to Patient   RxID:   [id number] LIPITOR 20 MG TABS (ATORVASTATIN CALCIUM) 1 by mouth once daily for cholesterol  #30 x 12   Entered and Authorized by:   Tresa Garter MD   Signed by:   Tresa Garter MD on 03/12/2010   Method used:   Print then Give to Patient   RxID:   [id number]

## 2010-09-15 NOTE — Miscellaneous (Signed)
[facility]   Imported By: Lester [place] 11/14/2009 08:23:18  _____________________________________________________________________  External Attachment:    Type:   Image     Comment:   External Document

## 2010-09-15 NOTE — Assessment & Plan Note (Signed)
Summary: cpx / bcbs / #/cd   Vital Signs:  Patient profile:   50 year old male Weight:      259 pounds Temp:     98.1 degrees F oral Pulse rate:   65 / minute BP sitting:   130 / 80  (left arm)  Vitals Entered By: Tora Perches (November 04, 2009 8:31 AM) CC: cpx Is Patient Diabetic? Yes   CC:  cpx.  History of Present Illness: The patient presents for a wellness examination  The patient presents for a follow up of hypertension, diabetes, hyperlipidemia C/o nose bleeds out L side  Preventive Screening-Counseling & Management  Alcohol-Tobacco     Smoking Status: quit  Current Medications (verified): 1)  Ziac 10-6.25 Mg  Tabs (Bisoprolol-Hydrochlorothiazide) .... Once Daily 2)  Simvastatin 20 Mg Tabs (Simvastatin) .Marland Kitchen.. 1 By Mouth Once Daily 3)  Ibuprofen 600 Mg  Tabs (Ibuprofen) .Marland Kitchen.. 1 By Mouth Two Times A Day Prn 4)  Cialis 20 Mg Tabs (Tadalafil) .... Q3d Prn 5)  Multi-Day   Tabs (Multiple Vitamin) .Marland Kitchen.. 1 Once Daily 6)  Baby Aspirin 81 Mg  Chew (Aspirin) .... Once Daily 7)  Tylenol 325 Mg  Tabs (Acetaminophen) .... As Needed 8)  Glucophage 1000 Mg  Tabs (Metformin Hcl) .... One By Mouth Twice A Day 9)  Glimepiride 2 Mg Tabs (Glimepiride) .... 1/2 or 1 By Mouth Once Daily 10)  Hydrocodone-Acetaminophen 10-500 Mg  Tabs (Hydrocodone-Acetaminophen) .Marland Kitchen.. 1-2 By Mouth Qid As Needed Pain 11)  Vitamin D3 1000 Unit  Tabs (Cholecalciferol) .Marland Kitchen.. 1 By Mouth Daily  Allergies: 1)  ! Morphine Sulfate (Morphine Sulfate)  Past History:  Past Surgical History: Last updated: 02/28/2009 Lumbar laminectomy Lumbar fusion  Family History: Last updated: 11/04/2009 Family History of Prostate CA 1st degree relative <50 F melanoma  Social History: Last updated: 11/04/2009 Occupation: Married Former Smoker long time ago Regular exercise-no  Past Medical History: insomnia family hx of prostate ca mood swings OSA Diabetes mellitus, type II Hyperlipidemia Hypertension ED Low back  pain Skin Ca on L nose 2008  Family History: Family History of Prostate CA 1st degree relative <50 F melanoma  Social History: Occupation: Married Former Smoker long time ago Regular exercise-no  Review of Systems  The patient denies anorexia, fever, weight loss, weight gain, vision loss, decreased hearing, hoarseness, chest pain, syncope, dyspnea on exertion, peripheral edema, prolonged cough, headaches, hemoptysis, abdominal pain, melena, hematochezia, severe indigestion/heartburn, hematuria, incontinence, genital sores, muscle weakness, suspicious skin lesions, transient blindness, difficulty walking, depression, unusual weight change, abnormal bleeding, enlarged lymph nodes, angioedema, and testicular masses.         L nose bleeds  Physical Exam  General:   overweight-appearing.   Head:  Normocephalic and atraumatic without obvious abnormalities. No apparent alopecia or balding. Eyes:  No corneal or conjunctival inflammation noted. EOMI. Perrla.  Ears:  External ear exam shows no significant lesions or deformities.  Otoscopic examination reveals clear canals, tympanic membranes are intact bilaterally without bulging, retraction, inflammation or discharge. Hearing is grossly normal bilaterally. Nose:  L nostril is swollen Mouth:  Oral mucosa and oropharynx without lesions or exudates.  Teeth in good repair. Neck:  No deformities, masses, or tenderness noted. Lungs:  Normal respiratory effort, chest expands symmetrically. Lungs are clear to auscultation, no crackles or wheezes. Heart:  Normal rate and regular rhythm. S1 and S2 normal without gallop, murmur, click, rub or other extra sounds. Abdomen:  Bowel sounds positive,abdomen soft and non-tender without masses, organomegaly or  hernias noted. Rectal:  No external abnormalities noted. Normal sphincter tone. No rectal masses or tenderness. Genitalia:  Testes bilaterally descended without nodularity, tenderness or masses. No scrotal  masses or lesions. No penis lesions or urethral discharge. Prostate:  Prostate gland firm and smooth, no enlargement, nodularity, tenderness, mass, asymmetry or induration. Msk:  No deformity or scoliosis noted of thoracic or lumbar spine.   Extremities:  No clubbing, cyanosis, edema, or deformity noted with normal full range of motion of all joints.   Neurologic:  No cranial nerve deficits noted. Station and gait are normal. Plantar reflexes are down-going bilaterally. DTRs are symmetrical throughout. Sensory, motor and coordinative functions appear intact. Skin:  Intact without suspicious lesions or rashes Cervical Nodes:  No lymphadenopathy noted Inguinal Nodes:  No significant adenopathy Psych:  Cognition and judgment appear intact. Alert and cooperative with normal attention span and concentration. No apparent delusions, illusions, hallucinations   Impression & Recommendations:  Problem # 1:  WELL ADULT EXAM (ICD-V70.0) Assessment New Health and age related issues were discussed. Available screening tests and vaccinations were discussed as well. Healthy life style including good diet and execise was discussed.  The labs were reviewed with the patient.  Orders: EKG w/ Interpretation (93000)OK  Problem # 2:  EPISTAXIS (ICD-784.7) L chronic Assessment: New Try Flonase Use the Sinus rinse as needed  Orders: ENT Referral (ENT)  Problem # 3:  HYPERTENSION (ICD-401.9) Assessment: Unchanged  His updated medication list for this problem includes:    Ziac 10-6.25 Mg Tabs (Bisoprolol-hydrochlorothiazide) ..... Once daily  Problem # 4:  DIABETES MELLITUS, TYPE II (ICD-250.00) Assessment: Deteriorated  His updated medication list for this problem includes:    Baby Aspirin 81 Mg Chew (Aspirin) ..... Once daily    Glucophage 1000 Mg Tabs (Metformin hcl) ..... One by mouth twice a day    Glimepiride 2 Mg Tabs (Glimepiride) .Marland Kitchen... 1/2 or 1 by mouth once daily See "Patient Instructions".    Labs Reviewed: Creat: 0.9 (10/28/2009)    Reviewed HgBA1c results: 7.2 (10/28/2009)  6.7 (06/26/2009)  Complete Medication List: 1)  Ziac 10-6.25 Mg Tabs (Bisoprolol-hydrochlorothiazide) .... Once daily 2)  Simvastatin 20 Mg Tabs (Simvastatin) .Marland Kitchen.. 1 by mouth once daily 3)  Ibuprofen 600 Mg Tabs (Ibuprofen) .Marland Kitchen.. 1 by mouth two times a day prn 4)  Cialis 20 Mg Tabs (Tadalafil) .... Q3d as needed 5)  Multi-day Tabs (Multiple vitamin) .Marland Kitchen.. 1 once daily 6)  Baby Aspirin 81 Mg Chew (Aspirin) .... Once daily 7)  Tylenol 325 Mg Tabs (Acetaminophen) .... As needed 8)  Glucophage 1000 Mg Tabs (Metformin hcl) .... One by mouth twice a day 9)  Glimepiride 2 Mg Tabs (Glimepiride) .... 1/2 or 1 by mouth once daily 10)  Hydrocodone-acetaminophen 10-500 Mg Tabs (Hydrocodone-acetaminophen) .Marland Kitchen.. 1-2 by mouth qid as needed pain 11)  Vitamin D3 1000 Unit Tabs (Cholecalciferol) .Marland Kitchen.. 1 by mouth daily 12)  Flonase 50 Mcg/act Susp (Fluticasone propionate) .Marland Kitchen.. 1 spr each nostr qd as needed  Patient Instructions: 1)  Please schedule a follow-up appointment in 4 months. 2)  BMP prior to visit, ICD-9: 3)  Lipid Panel prior to visit, ICD-9:250.00 4)  HbgA1C prior to visit, ICD-9: 5)  You need to lose weight. Consider a lower calorie diet and regular exercise.  Prescriptions: CIALIS 20 MG TABS (TADALAFIL) q3d as needed  #12 x 6   Entered and Authorized by:   Tresa Garter MD   Signed by:   Tresa Garter MD on 11/04/2009  Method used:   Print then Give to Patient   RxID:   [id number] FLONASE 50 MCG/ACT SUSP (FLUTICASONE PROPIONATE) 1 spr each nostr qd as needed  #1 x 3   Entered and Authorized by:   Tresa Garter MD   Signed by:   Tresa Garter MD on 11/04/2009   Method used:   Print then Give to Patient   RxID:   [id number]

## 2010-09-17 NOTE — Progress Notes (Signed)
Summary: Tetanus due  ---- Converted from flag ---- ---- 07/17/2010 1:02 PM, Georgina Quint Graysen Woodyard MD wrote: He can come to see you to get it or get it next time he is here Thank you!   ---- 07/16/2010 1:26 PM, Lanier Prude, CMA(AAMA) wrote:   ---- 07/16/2010 1:25 PM, Lanier Prude, CMA(AAMA) wrote: I reviewed paper chart. I do not see when last tetanus was documented.  ---- 07/15/2010 11:57 AM, Lanier Prude, CMA(AAMA) wrote: chart requested  ---- 07/15/2010 8:00 AM, Tresa Garter MD wrote: Misty Stanley, pleaseget his paper chart re Tetanus  Thanks, AP ------------------------------  Phone Note Outgoing Call   Call placed by: Lanier Prude, [facility]),  July 24, 2010 8:30 AM Summary of Call: left mess for pt to call back to inform of above  Follow-up for Phone Call        left detailed mess informing pt to sched nurse visit for Tetanus inj or he can get it at next OV. Follow-up by: Lanier Prude, [facility]),  July 30, 2010 8:49 AM

## 2010-11-06 ENCOUNTER — Other Ambulatory Visit (INDEPENDENT_AMBULATORY_CARE_PROVIDER_SITE_OTHER): Payer: BC Managed Care – PPO

## 2010-11-06 DIAGNOSIS — Z0000 Encounter for general adult medical examination without abnormal findings: Secondary | ICD-10-CM

## 2010-11-06 DIAGNOSIS — E118 Type 2 diabetes mellitus with unspecified complications: Secondary | ICD-10-CM | POA: Insufficient documentation

## 2010-11-06 DIAGNOSIS — E119 Type 2 diabetes mellitus without complications: Secondary | ICD-10-CM

## 2010-11-06 DIAGNOSIS — Z0389 Encounter for observation for other suspected diseases and conditions ruled out: Secondary | ICD-10-CM

## 2010-11-06 DIAGNOSIS — E785 Hyperlipidemia, unspecified: Secondary | ICD-10-CM

## 2010-11-06 LAB — URINALYSIS
Bilirubin Urine: NEGATIVE
Hgb urine dipstick: NEGATIVE
Ketones, ur: 15
Leukocytes, UA: NEGATIVE
Specific Gravity, Urine: 1.02 (ref 1.000–1.030)
Urine Glucose: NEGATIVE
Urobilinogen, UA: 0.2 (ref 0.0–1.0)

## 2010-11-06 LAB — HEPATIC FUNCTION PANEL
ALT: 32 U/L (ref 0–53)
AST: 43 U/L — ABNORMAL HIGH (ref 0–37)
Albumin: 4.1 g/dL (ref 3.5–5.2)
Total Protein: 7 g/dL (ref 6.0–8.3)

## 2010-11-06 LAB — BASIC METABOLIC PANEL
Chloride: 104 mEq/L (ref 96–112)
Creatinine, Ser: 0.8 mg/dL (ref 0.4–1.5)
GFR: 106.54 mL/min (ref >60.00)

## 2010-11-06 LAB — CBC WITH DIFFERENTIAL/PLATELET
Basophils Absolute: 0 10*3/uL (ref 0.0–0.1)
Hemoglobin: 15.1 g/dL (ref 13.0–17.0)
Lymphs Abs: 3.1 10*3/uL (ref 0.7–4.0)
MCV: 86.8 fl (ref 78.0–100.0)
Monocytes Relative: 4.5 % (ref 3.0–12.0)
Neutro Abs: 5.4 10*3/uL (ref 1.4–7.7)
RBC: 5.02 Mil/uL (ref 4.22–5.81)
RDW: 12.4 % (ref 11.5–14.6)

## 2010-11-06 LAB — LIPID PANEL
Cholesterol: 123 mg/dL (ref 0–200)
Total CHOL/HDL Ratio: 4
Triglycerides: 278 mg/dL — ABNORMAL HIGH (ref 0.0–149.0)

## 2010-11-06 LAB — TSH: TSH: 1.11 u[IU]/mL (ref 0.35–5.50)

## 2010-11-13 ENCOUNTER — Encounter: Payer: Self-pay | Admitting: Internal Medicine

## 2010-11-13 ENCOUNTER — Ambulatory Visit (INDEPENDENT_AMBULATORY_CARE_PROVIDER_SITE_OTHER): Payer: BC Managed Care – PPO | Admitting: Internal Medicine

## 2010-11-13 VITALS — BP 120/90 | HR 68 | Temp 98.6°F | Resp 16 | Ht 69.0 in | Wt 255.0 lb

## 2010-11-13 DIAGNOSIS — R21 Rash and other nonspecific skin eruption: Secondary | ICD-10-CM

## 2010-11-13 DIAGNOSIS — M545 Low back pain, unspecified: Secondary | ICD-10-CM

## 2010-11-13 DIAGNOSIS — Z0000 Encounter for general adult medical examination without abnormal findings: Secondary | ICD-10-CM

## 2010-11-13 DIAGNOSIS — D485 Neoplasm of uncertain behavior of skin: Secondary | ICD-10-CM

## 2010-11-13 DIAGNOSIS — E785 Hyperlipidemia, unspecified: Secondary | ICD-10-CM

## 2010-11-13 DIAGNOSIS — E119 Type 2 diabetes mellitus without complications: Secondary | ICD-10-CM

## 2010-11-13 MED ORDER — TRIAMCINOLONE ACETONIDE 0.5 % EX CREA: TOPICAL_CREAM | Freq: Three times a day (TID) | CUTANEOUS | Status: AC

## 2010-11-13 MED ORDER — GLIMEPIRIDE 2 MG PO TABS: 2.0000 mg | ORAL_TABLET | Freq: Every day | ORAL | Status: DC

## 2010-11-13 MED ORDER — IBUPROFEN 600 MG PO TABS: 600.0000 mg | ORAL_TABLET | Freq: Two times a day (BID) | ORAL | Status: DC | PRN

## 2010-11-13 NOTE — Assessment & Plan Note (Signed)
[place] - Triamc BID

## 2010-11-13 NOTE — Assessment & Plan Note (Signed)
Check A1c Cont RX

## 2010-11-13 NOTE — Assessment & Plan Note (Signed)
He needs to sch a colon test Age related isues discussed EKG was OK Labs reviewed

## 2010-11-13 NOTE — Assessment & Plan Note (Signed)
On Rx 

## 2010-11-13 NOTE — Progress Notes (Signed)
Subjective:    Patient ID: Gary Fowler, male    DOB: 08/18/1960, 50 y.o.   MRN: [id number]  HPI  The patient is here for a wellness exam. The patient has been doing well overall without major physical or psychological issues going on lately. The patient needs to address his chronic hypertension that has been well controlled with medicines; to address chronic  hyperlipidemia controlled with medicines as well; and to address type 2 chronic diabetes, controlled better with medical treatment and diet. [facility]   Review of Systems  Constitutional: Negative for diaphoresis, appetite change, fatigue and unexpected weight change.  HENT: Negative for hearing loss, nosebleeds, congestion, sore throat, sneezing, trouble swallowing and neck pain.   Eyes: Negative for itching and visual disturbance.  Respiratory: Negative for cough, chest tightness and stridor.   Cardiovascular: Negative for chest pain, palpitations and leg swelling.  Gastrointestinal: Negative for nausea, diarrhea, blood in stool and abdominal distention.  Genitourinary: Negative for dysuria, frequency, hematuria and scrotal swelling.  Musculoskeletal: Negative for back pain, joint swelling and gait problem.  Skin: Negative for pallor, rash and wound.  Neurological: Negative for dizziness, tremors, speech difficulty, weakness and headaches.  Psychiatric/Behavioral: Negative for behavioral problems, confusion, sleep disturbance, dysphoric mood and agitation. The patient is not nervous/anxious.        Objective:   Physical Exam  Constitutional: He is oriented to person, place, and time. He appears well-developed and well-nourished. No distress.       Lost 2 lbs  HENT:  Head: Normocephalic and atraumatic.  Right Ear: External ear normal.  Left Ear: External ear normal.  Nose: Nose normal.  Mouth/Throat: Oropharynx is clear and moist. No oropharyngeal exudate.  Eyes: Conjunctivae and EOM are  normal. Pupils are equal, round, and reactive to light. Right eye exhibits no discharge. Left eye exhibits no discharge. No scleral icterus.  Neck: Normal range of motion. Neck supple. No JVD present. No tracheal deviation present. No thyromegaly present.  Cardiovascular: Normal rate, regular rhythm, normal heart sounds and intact distal pulses.  Exam reveals no gallop and no friction rub.   No murmur heard. Pulmonary/Chest: Effort normal and breath sounds normal. No stridor. No respiratory distress. He has no wheezes. He has no rales. He exhibits no tenderness.  Abdominal: Soft. Bowel sounds are normal. He exhibits no distension and no mass. There is no tenderness. There is no rebound and no guarding.  Genitourinary: Rectum normal, prostate normal and penis normal.       Rectal and prostate - per GI  Musculoskeletal: Normal range of motion. He exhibits no edema and no tenderness.  Lymphadenopathy:    He has no cervical adenopathy.  Neurological: He is alert and oriented to person, place, and time. He has normal reflexes. No cranial nerve deficit. He exhibits normal muscle tone. Coordination normal.  Skin: Skin is warm and dry. No rash noted. He is not diaphoretic. There is erythema (face is sunburnt). No pallor.  Psychiatric: He has a normal mood and affect. His behavior is normal. Judgment and thought content normal.   Lab Results  Component Value Date   WBC 9.4 11/06/2010   HGB 15.1 11/06/2010   HCT 43.6 11/06/2010   PLT 164.0 11/06/2010   CHOL 123 11/06/2010   TRIG 278.0* 11/06/2010   HDL 30.00* 11/06/2010   LDLDIRECT 66.8 11/06/2010   ALT 32 11/06/2010   AST 43* 11/06/2010   NA 140 11/06/2010   K  5.0 11/06/2010   CL 104 11/06/2010   CREATININE 0.8 11/06/2010   BUN 20 11/06/2010   CO2 30 11/06/2010   TSH 1.11 11/06/2010   PSA 0.52 11/06/2010   HGBA1C 7.8* 07/08/2010          Assessment & Plan:  Routine general medical examination at a health care facility He needs to sch a colon  test Age related isues discussed EKG was OK Labs reviewed  DIABETES MELLITUS, TYPE II Check A1c Cont RX  HYPERLIPIDEMIA On Rx  Rash on face [place] - Triamc BID

## 2010-12-10 ENCOUNTER — Encounter: Payer: Self-pay | Admitting: Gastroenterology

## 2010-12-10 ENCOUNTER — Ambulatory Visit (AMBULATORY_SURGERY_CENTER): Payer: BC Managed Care – PPO | Admitting: *Deleted

## 2010-12-10 VITALS — Ht 69.0 in | Wt 260.3 lb

## 2010-12-10 DIAGNOSIS — Z1211 Encounter for screening for malignant neoplasm of colon: Secondary | ICD-10-CM

## 2010-12-10 MED ORDER — PEG-KCL-NACL-NASULF-NA ASC-C 100 G PO SOLR: ORAL | Status: DC

## 2010-12-25 ENCOUNTER — Ambulatory Visit (AMBULATORY_SURGERY_CENTER): Payer: BC Managed Care – PPO | Admitting: Gastroenterology

## 2010-12-25 ENCOUNTER — Encounter: Payer: Self-pay | Admitting: Gastroenterology

## 2010-12-25 VITALS — BP 138/68 | HR 69 | Temp 98.3°F | Resp 16 | Ht 69.0 in | Wt 260.0 lb

## 2010-12-25 DIAGNOSIS — Z1211 Encounter for screening for malignant neoplasm of colon: Secondary | ICD-10-CM

## 2010-12-25 LAB — GLUCOSE, CAPILLARY
Glucose-Capillary: 161 mg/dL — ABNORMAL HIGH (ref 70–99)
Glucose-Capillary: 142 mg/dL — ABNORMAL HIGH (ref 70–99)

## 2010-12-25 MED ORDER — SODIUM CHLORIDE 0.9 % IV SOLN: 500.0000 mL | INTRAVENOUS | Status: DC

## 2010-12-25 NOTE — Patient Instructions (Signed)
Normal colon exam today.  Dr. Christella Hartigan recommends further colonoscopy in 10 yrs, 2022.  There is no need for FOBT (stool test) for at lest 5 years.  Blood sugar was 142 in the recovery room.  Please resume your medications today.  Call if any questions or concerns.

## 2010-12-28 ENCOUNTER — Telehealth: Payer: Self-pay | Admitting: *Deleted

## 2010-12-28 NOTE — Telephone Encounter (Signed)
No answer, did not leave message no answering machine

## 2011-03-17 ENCOUNTER — Other Ambulatory Visit (INDEPENDENT_AMBULATORY_CARE_PROVIDER_SITE_OTHER): Payer: BC Managed Care – PPO

## 2011-03-17 DIAGNOSIS — E119 Type 2 diabetes mellitus without complications: Secondary | ICD-10-CM

## 2011-03-17 DIAGNOSIS — E785 Hyperlipidemia, unspecified: Secondary | ICD-10-CM

## 2011-03-17 LAB — LIPID PANEL
Cholesterol: 130 mg/dL (ref 0–200)
LDL Cholesterol: 56 mg/dL (ref 0–99)
Triglycerides: 183 mg/dL — ABNORMAL HIGH (ref 0.0–149.0)

## 2011-03-18 LAB — COMPREHENSIVE METABOLIC PANEL
Albumin: 4.3 g/dL (ref 3.5–5.2)
Alkaline Phosphatase: 66 U/L (ref 39–117)
BUN: 10 mg/dL (ref 6–23)
Glucose, Bld: 171 mg/dL — ABNORMAL HIGH (ref 70–99)
Potassium: 3.8 mEq/L (ref 3.5–5.1)

## 2011-03-19 ENCOUNTER — Other Ambulatory Visit: Payer: Self-pay | Admitting: Internal Medicine

## 2011-03-22 ENCOUNTER — Ambulatory Visit (INDEPENDENT_AMBULATORY_CARE_PROVIDER_SITE_OTHER): Payer: BC Managed Care – PPO | Admitting: Internal Medicine

## 2011-03-22 ENCOUNTER — Encounter: Payer: Self-pay | Admitting: Internal Medicine

## 2011-03-22 DIAGNOSIS — I10 Essential (primary) hypertension: Secondary | ICD-10-CM

## 2011-03-22 DIAGNOSIS — E119 Type 2 diabetes mellitus without complications: Secondary | ICD-10-CM

## 2011-03-22 NOTE — Assessment & Plan Note (Signed)
BP Readings from Last 3 Encounters:  03/22/11 118/84  12/25/10 138/68  11/13/10 120/90

## 2011-03-22 NOTE — Progress Notes (Signed)
  Subjective:    Patient ID: Gary Fowler, male    DOB: 09-12-1960, 50 y.o.   MRN: [id number]  HPI  The patient presents for a follow-up of  chronic hypertension, chronic dyslipidemia, type 2 diabetes controlled with medicines On Pepsi 2+liters a day   Review of Systems  Constitutional: Negative for appetite change, fatigue and unexpected weight change (on diet now).  HENT: Negative for nosebleeds, congestion, sore throat, sneezing, trouble swallowing and neck pain.   Eyes: Negative for itching and visual disturbance.  Respiratory: Negative for cough.   Cardiovascular: Negative for chest pain, palpitations and leg swelling.  Gastrointestinal: Negative for nausea, diarrhea, blood in stool and abdominal distention.  Genitourinary: Negative for frequency and hematuria.  Musculoskeletal: Negative for back pain, joint swelling and gait problem.  Skin: Negative for rash.  Neurological: Negative for dizziness, tremors, speech difficulty and weakness.  Psychiatric/Behavioral: Negative for sleep disturbance, dysphoric mood and agitation. The patient is not nervous/anxious.    Wt Readings from Last 3 Encounters:  03/22/11 254 lb (115.214 kg)  12/25/10 260 lb (117.935 kg)  12/10/10 260 lb 4.8 oz (118.071 kg)       Objective:   Physical Exam  Constitutional: He is oriented to person, place, and time. He appears well-developed.  HENT:  Mouth/Throat: Oropharynx is clear and moist.  Eyes: Conjunctivae are normal. Pupils are equal, round, and reactive to light.  Neck: Normal range of motion. No JVD present. No thyromegaly present.  Cardiovascular: Normal rate, regular rhythm, normal heart sounds and intact distal pulses.  Exam reveals no gallop and no friction rub.   No murmur heard. Pulmonary/Chest: Effort normal and breath sounds normal. No respiratory distress. He has no wheezes. He has no rales. He exhibits no tenderness.  Abdominal: Soft. Bowel sounds are normal. He exhibits no  distension and no mass. There is no tenderness. There is no rebound and no guarding.  Musculoskeletal: Normal range of motion. He exhibits no edema and no tenderness.  Lymphadenopathy:    He has no cervical adenopathy.  Neurological: He is alert and oriented to person, place, and time. He has normal reflexes. No cranial nerve deficit. He exhibits normal muscle tone. Coordination normal.  Skin: Skin is warm and dry. No rash noted.  Psychiatric: He has a normal mood and affect. His behavior is normal. Judgment and thought content normal.          Assessment & Plan:

## 2011-03-22 NOTE — Assessment & Plan Note (Signed)
Lab Results  Component Value Date   WBC 9.4 11/06/2010   HGB 15.1 11/06/2010   HCT 43.6 11/06/2010   PLT 164.0 11/06/2010   CHOL 130 03/17/2011   TRIG 183.0* 03/17/2011   HDL 37.70* 03/17/2011   LDLDIRECT 66.8 11/06/2010   ALT 38 03/17/2011   AST 52* 03/17/2011   NA 139 03/17/2011   K 3.8 03/17/2011   CL 103 03/17/2011   CREATININE 0.8 03/17/2011   BUN 10 03/17/2011   CO2 28 03/17/2011   TSH 1.11 11/06/2010   PSA 0.52 11/06/2010   HGBA1C 8.1* 03/17/2011

## 2011-03-22 NOTE — Patient Instructions (Addendum)
Take Glimepiride 2 mg 1 in am and 1/2 in pm if sugars are elevated Cont. w/wt loss Cut back on Pepsi

## 2011-05-08 ENCOUNTER — Other Ambulatory Visit: Payer: Self-pay | Admitting: Internal Medicine

## 2011-06-28 ENCOUNTER — Other Ambulatory Visit (INDEPENDENT_AMBULATORY_CARE_PROVIDER_SITE_OTHER): Payer: BC Managed Care – PPO

## 2011-06-28 DIAGNOSIS — E119 Type 2 diabetes mellitus without complications: Secondary | ICD-10-CM

## 2011-06-28 DIAGNOSIS — I10 Essential (primary) hypertension: Secondary | ICD-10-CM

## 2011-06-29 LAB — BASIC METABOLIC PANEL
CO2: 27 mEq/L (ref 19–32)
Calcium: 8.8 mg/dL (ref 8.4–10.5)
Glucose, Bld: 137 mg/dL — ABNORMAL HIGH (ref 70–99)
Potassium: 4.5 mEq/L (ref 3.5–5.1)
Sodium: 142 mEq/L (ref 135–145)

## 2011-07-01 ENCOUNTER — Ambulatory Visit (INDEPENDENT_AMBULATORY_CARE_PROVIDER_SITE_OTHER): Payer: BC Managed Care – PPO | Admitting: Internal Medicine

## 2011-07-01 ENCOUNTER — Encounter: Payer: Self-pay | Admitting: Internal Medicine

## 2011-07-01 VITALS — BP 138/90 | HR 80 | Temp 98.1°F | Resp 16 | Wt 255.0 lb

## 2011-07-01 DIAGNOSIS — E785 Hyperlipidemia, unspecified: Secondary | ICD-10-CM

## 2011-07-01 DIAGNOSIS — I10 Essential (primary) hypertension: Secondary | ICD-10-CM

## 2011-07-01 DIAGNOSIS — E119 Type 2 diabetes mellitus without complications: Secondary | ICD-10-CM

## 2011-07-01 MED ORDER — GLIMEPIRIDE 2 MG PO TABS: 2.0000 mg | ORAL_TABLET | Freq: Two times a day (BID) | ORAL | Status: DC

## 2011-07-01 MED ORDER — METFORMIN HCL 1000 MG PO TABS: 1000.0000 mg | ORAL_TABLET | Freq: Two times a day (BID) | ORAL | Status: DC

## 2011-07-01 NOTE — Assessment & Plan Note (Signed)
Continue with current prescription therapy as reflected on the Med list.  

## 2011-07-01 NOTE — Progress Notes (Signed)
  Subjective:    Patient ID: Gary Fowler, male    DOB: Mar 08, 1961, 50 y.o.   MRN: [id number]  HPI  The patient presents for a follow-up of  chronic hypertension, chronic dyslipidemia, type 2 diabetes controlled with medicines. Now on Amaryl bid    Review of Systems  Constitutional: Negative for appetite change, fatigue and unexpected weight change.  HENT: Negative for nosebleeds, congestion, sore throat, sneezing, trouble swallowing and neck pain.   Eyes: Negative for itching and visual disturbance.  Respiratory: Negative for cough.   Cardiovascular: Negative for chest pain, palpitations and leg swelling.  Gastrointestinal: Negative for nausea, diarrhea, blood in stool and abdominal distention.  Genitourinary: Negative for frequency and hematuria.  Musculoskeletal: Negative for back pain, joint swelling and gait problem.  Skin: Negative for rash.  Neurological: Negative for dizziness, tremors, speech difficulty and weakness.  Psychiatric/Behavioral: Negative for sleep disturbance, dysphoric mood and agitation. The patient is not nervous/anxious.    Wt Readings from Last 3 Encounters:  07/01/11 255 lb (115.667 kg)  03/22/11 254 lb (115.214 kg)  12/25/10 260 lb (117.935 kg)       Objective:   Physical Exam  Constitutional: He is oriented to person, place, and time. He appears well-developed.  HENT:  Mouth/Throat: Oropharynx is clear and moist.  Eyes: Conjunctivae are normal. Pupils are equal, round, and reactive to light.  Neck: Normal range of motion. No JVD present. No thyromegaly present.  Cardiovascular: Normal rate, regular rhythm, normal heart sounds and intact distal pulses.  Exam reveals no gallop and no friction rub.   No murmur heard. Pulmonary/Chest: Effort normal and breath sounds normal. No respiratory distress. He has no wheezes. He has no rales. He exhibits no tenderness.  Abdominal: Soft. Bowel sounds are normal. He exhibits no distension and no mass. There  is no tenderness. There is no rebound and no guarding.  Musculoskeletal: Normal range of motion. He exhibits no edema and no tenderness.  Lymphadenopathy:    He has no cervical adenopathy.  Neurological: He is alert and oriented to person, place, and time. He has normal reflexes. No cranial nerve deficit. He exhibits normal muscle tone. Coordination normal.  Skin: Skin is warm and dry. No rash noted.  Psychiatric: He has a normal mood and affect. His behavior is normal. Judgment and thought content normal.   Lab Results  Component Value Date   WBC 9.4 11/06/2010   HGB 15.1 11/06/2010   HCT 43.6 11/06/2010   PLT 164.0 11/06/2010   GLUCOSE 137* 06/28/2011   CHOL 130 03/17/2011   TRIG 183.0* 03/17/2011   HDL 37.70* 03/17/2011   LDLDIRECT 66.8 11/06/2010   LDLCALC 56 03/17/2011   ALT 38 03/17/2011   AST 52* 03/17/2011   NA 142 06/28/2011   K 4.5 06/28/2011   CL 107 06/28/2011   CREATININE 0.9 06/28/2011   BUN 14 06/28/2011   CO2 27 06/28/2011   TSH 1.11 11/06/2010   PSA 0.52 11/06/2010   HGBA1C 7.0* 06/28/2011          Assessment & Plan:

## 2011-07-01 NOTE — Assessment & Plan Note (Signed)
Continue with current prescription therapy as reflected on the Med list. On Amaryl BID. Switch to Metform 1000 mg bid

## 2011-07-13 ENCOUNTER — Telehealth: Payer: Self-pay | Admitting: Internal Medicine

## 2011-07-13 DIAGNOSIS — Z0389 Encounter for observation for other suspected diseases and conditions ruled out: Secondary | ICD-10-CM

## 2011-07-13 NOTE — Telephone Encounter (Signed)
Lab entered

## 2011-07-13 NOTE — Telephone Encounter (Signed)
Message copied by Merrilyn Puma on Tue Jul 13, 2011  4:09 PM ------      Message from: Etheleen Sia      Created: Fri Jul 02, 2011 11:43 AM      Regarding: FW: LABS                   ----- Message -----         From: Sonda Primes, MD         Sent: 07/01/2011   9:48 PM           To: Etheleen Sia      Subject: RE: [place]      Thx      ----- Message -----         From: Etheleen Sia         Sent: 07/01/2011   8:19 AM           To: Sonda Primes, MD      Subject: LABS                                                     MR. Lader WOULD LIKE A PSA ADDED TO THE LABS HE WILL DO FOR HIS PHYSICAL IN April.

## 2011-07-13 NOTE — Telephone Encounter (Signed)
Message copied by Virgina Evener on Tue Jul 13, 2011  4:00 PM ------      Message from: Etheleen Sia      Created: Fri Jul 02, 2011 11:43 AM      Regarding: FW: LABS                   ----- Message -----         From: Sonda Primes, MD         Sent: 07/01/2011   9:48 PM           To: Etheleen Sia      Subject: RE: [place]      Thx      ----- Message -----         From: Etheleen Sia         Sent: 07/01/2011   8:19 AM           To: Sonda Primes, MD      Subject: LABS                                                     MR. Chavarria WOULD LIKE A PSA ADDED TO THE LABS HE WILL DO FOR HIS PHYSICAL IN April.

## 2011-07-23 ENCOUNTER — Other Ambulatory Visit: Payer: Self-pay | Admitting: Internal Medicine

## 2011-08-26 ENCOUNTER — Other Ambulatory Visit: Payer: Self-pay | Admitting: Endocrinology

## 2011-11-15 ENCOUNTER — Other Ambulatory Visit (INDEPENDENT_AMBULATORY_CARE_PROVIDER_SITE_OTHER): Payer: BC Managed Care – PPO

## 2011-11-15 DIAGNOSIS — I10 Essential (primary) hypertension: Secondary | ICD-10-CM

## 2011-11-15 DIAGNOSIS — E119 Type 2 diabetes mellitus without complications: Secondary | ICD-10-CM

## 2011-11-15 DIAGNOSIS — E785 Hyperlipidemia, unspecified: Secondary | ICD-10-CM

## 2011-11-15 DIAGNOSIS — Z0389 Encounter for observation for other suspected diseases and conditions ruled out: Secondary | ICD-10-CM

## 2011-11-15 LAB — LIPID PANEL
HDL: 29.2 mg/dL — ABNORMAL LOW (ref >39.00)
LDL Cholesterol: 43 mg/dL (ref 0–99)
Total CHOL/HDL Ratio: 3
Triglycerides: 142 mg/dL (ref 0.0–149.0)

## 2011-11-15 LAB — HEPATIC FUNCTION PANEL
Albumin: 3.9 g/dL (ref 3.5–5.2)
Total Protein: 6.3 g/dL (ref 6.0–8.3)

## 2011-11-15 LAB — PSA: PSA: 0.55 ng/mL (ref 0.10–4.00)

## 2011-11-15 LAB — BASIC METABOLIC PANEL
CO2: 31 mEq/L (ref 19–32)
Chloride: 105 mEq/L (ref 96–112)
Sodium: 142 mEq/L (ref 135–145)

## 2011-11-15 LAB — HEMOGLOBIN A1C: Hgb A1c MFr Bld: 7.3 % — ABNORMAL HIGH (ref 4.6–6.5)

## 2011-11-19 ENCOUNTER — Ambulatory Visit (INDEPENDENT_AMBULATORY_CARE_PROVIDER_SITE_OTHER): Payer: BC Managed Care – PPO | Admitting: Internal Medicine

## 2011-11-19 ENCOUNTER — Encounter: Payer: Self-pay | Admitting: Internal Medicine

## 2011-11-19 VITALS — BP 140/90 | HR 72 | Temp 98.3°F | Resp 16 | Ht 69.0 in | Wt 256.0 lb

## 2011-11-19 DIAGNOSIS — D485 Neoplasm of uncertain behavior of skin: Secondary | ICD-10-CM

## 2011-11-19 DIAGNOSIS — Z0000 Encounter for general adult medical examination without abnormal findings: Secondary | ICD-10-CM

## 2011-11-19 DIAGNOSIS — I10 Essential (primary) hypertension: Secondary | ICD-10-CM

## 2011-11-19 DIAGNOSIS — E785 Hyperlipidemia, unspecified: Secondary | ICD-10-CM

## 2011-11-19 DIAGNOSIS — E669 Obesity, unspecified: Secondary | ICD-10-CM | POA: Insufficient documentation

## 2011-11-19 DIAGNOSIS — Z136 Encounter for screening for cardiovascular disorders: Secondary | ICD-10-CM

## 2011-11-19 DIAGNOSIS — E119 Type 2 diabetes mellitus without complications: Secondary | ICD-10-CM

## 2011-11-19 MED ORDER — HYDROCODONE-ACETAMINOPHEN 10-500 MG PO TABS: 1.0000 | ORAL_TABLET | Freq: Four times a day (QID) | ORAL | Status: DC | PRN

## 2011-11-19 NOTE — Progress Notes (Signed)
Patient ID: Gary Fowler, male   DOB: 1961-04-06, 51 y.o.   MRN: [id number]  Subjective:    Patient ID: Gary Fowler, male    DOB: 1961-05-13, 51 y.o.   MRN: [id number]  HPI  The patient is here for a wellness exam. The patient has been doing well overall without major physical or psychological issues going on lately. The patient needs to address his chronic hypertension that has been well controlled with medicines; to address chronic  hyperlipidemia controlled with medicines as well; and to address type 2 chronic diabetes, controlled better with medical treatment and diet. He started to go to the [facility], then stopped  Wt Readings from Last 3 Encounters:  11/19/11 256 lb (116.121 kg)  07/01/11 255 lb (115.667 kg)  03/22/11 254 lb (115.214 kg)   BP Readings from Last 3 Encounters:  11/19/11 140/90  07/01/11 138/90  03/22/11 118/84      Review of Systems  Constitutional: Negative for diaphoresis, appetite change, fatigue and unexpected weight change.  HENT: Negative for hearing loss, nosebleeds, congestion, sore throat, sneezing, trouble swallowing and neck pain.   Eyes: Negative for itching and visual disturbance.  Respiratory: Negative for cough, chest tightness and stridor.   Cardiovascular: Negative for chest pain, palpitations and leg swelling.  Gastrointestinal: Negative for nausea, diarrhea, blood in stool and abdominal distention.  Genitourinary: Negative for dysuria, frequency, hematuria and scrotal swelling.  Musculoskeletal: Negative for back pain, joint swelling and gait problem.  Skin: Negative for pallor, rash and wound.  Neurological: Negative for dizziness, tremors, speech difficulty, weakness and headaches.  Psychiatric/Behavioral: Negative for behavioral problems, confusion, sleep disturbance, dysphoric mood and agitation. The patient is not nervous/anxious.        Objective:   Physical Exam  Constitutional: He is oriented to person, place, and time. He  appears well-developed and well-nourished. No distress.       Lost 2 lbs  HENT:  Head: Normocephalic and atraumatic.  Right Ear: External ear normal.  Left Ear: External ear normal.  Nose: Nose normal.  Mouth/Throat: Oropharynx is clear and moist. No oropharyngeal exudate.  Eyes: Conjunctivae and EOM are normal. Pupils are equal, round, and reactive to light. Right eye exhibits no discharge. Left eye exhibits no discharge. No scleral icterus.  Neck: Normal range of motion. Neck supple. No JVD present. No tracheal deviation present. No thyromegaly present.  Cardiovascular: Normal rate, regular rhythm, normal heart sounds and intact distal pulses.  Exam reveals no gallop and no friction rub.   No murmur heard. Pulmonary/Chest: Effort normal and breath sounds normal. No stridor. No respiratory distress. He has no wheezes. He has no rales. He exhibits no tenderness.  Abdominal: Soft. Bowel sounds are normal. He exhibits no distension and no mass. There is no tenderness. There is no rebound and no guarding.  Genitourinary: Rectum normal, prostate normal and penis normal.       Rectal and prostate - recent per GI -- declined  Musculoskeletal: Normal range of motion. He exhibits no edema and no tenderness.  Lymphadenopathy:    He has no cervical adenopathy.  Neurological: He is alert and oriented to person, place, and time. He has normal reflexes. No cranial nerve deficit. He exhibits normal muscle tone. Coordination normal.  Skin: Skin is warm and dry. No rash noted. He is not diaphoretic. No erythema. No pallor.       Lower back growth  Psychiatric: He has a normal mood and affect. His behavior is normal. Judgment and  thought content normal.   Lab Results  Component Value Date   WBC 9.4 11/06/2010   HGB 15.1 11/06/2010   HCT 43.6 11/06/2010   PLT 164.0 11/06/2010   CHOL 101 11/15/2011   TRIG 142.0 11/15/2011   HDL 29.20* 11/15/2011   LDLDIRECT 66.8 11/06/2010   ALT 27 11/15/2011   AST 28 11/15/2011    NA 142 11/15/2011   K 4.0 11/15/2011   CL 105 11/15/2011   CREATININE 0.7 11/15/2011   BUN 11 11/15/2011   CO2 31 11/15/2011   TSH 1.92 11/15/2011   PSA 0.55 11/15/2011   HGBA1C 7.3* 11/15/2011          Assessment & Plan:

## 2011-11-19 NOTE — Patient Instructions (Signed)
[email or web address] 

## 2011-11-19 NOTE — Assessment & Plan Note (Signed)
Continue with current prescription therapy as reflected on the Med list. Loose wt 

## 2011-11-19 NOTE — Assessment & Plan Note (Signed)
Continue with current prescription therapy as reflected on the Med list.  

## 2011-11-19 NOTE — Assessment & Plan Note (Signed)
Lap Band discussed 

## 2011-11-19 NOTE — Assessment & Plan Note (Signed)
We discussed age appropriate health related issues, including available/recomended screening tests and vaccinations. We discussed a need for adhering to healthy diet and exercise. Labs/EKG were reviewed/ordered. All questions were answered.   

## 2011-11-19 NOTE — Assessment & Plan Note (Signed)
Will sch bx 

## 2011-11-19 NOTE — Assessment & Plan Note (Signed)
Worse - diet discussed 

## 2011-12-31 ENCOUNTER — Other Ambulatory Visit: Payer: Self-pay | Admitting: Internal Medicine

## 2012-02-18 ENCOUNTER — Ambulatory Visit: Payer: BC Managed Care – PPO | Admitting: Internal Medicine

## 2012-03-26 ENCOUNTER — Other Ambulatory Visit: Payer: Self-pay | Admitting: Internal Medicine

## 2012-03-28 ENCOUNTER — Other Ambulatory Visit (INDEPENDENT_AMBULATORY_CARE_PROVIDER_SITE_OTHER): Payer: BC Managed Care – PPO

## 2012-03-28 DIAGNOSIS — E119 Type 2 diabetes mellitus without complications: Secondary | ICD-10-CM

## 2012-03-28 DIAGNOSIS — I10 Essential (primary) hypertension: Secondary | ICD-10-CM

## 2012-03-28 DIAGNOSIS — Z136 Encounter for screening for cardiovascular disorders: Secondary | ICD-10-CM

## 2012-03-28 DIAGNOSIS — Z0000 Encounter for general adult medical examination without abnormal findings: Secondary | ICD-10-CM

## 2012-03-28 DIAGNOSIS — D485 Neoplasm of uncertain behavior of skin: Secondary | ICD-10-CM

## 2012-03-28 DIAGNOSIS — E785 Hyperlipidemia, unspecified: Secondary | ICD-10-CM

## 2012-03-28 LAB — BASIC METABOLIC PANEL
BUN: 12 mg/dL (ref 6–23)
GFR: 109.07 mL/min (ref >60.00)
Potassium: 3.4 mEq/L — ABNORMAL LOW (ref 3.5–5.1)
Sodium: 138 mEq/L (ref 135–145)

## 2012-04-04 ENCOUNTER — Encounter: Payer: Self-pay | Admitting: Internal Medicine

## 2012-04-04 ENCOUNTER — Ambulatory Visit (INDEPENDENT_AMBULATORY_CARE_PROVIDER_SITE_OTHER): Payer: BC Managed Care – PPO | Admitting: Internal Medicine

## 2012-04-04 VITALS — BP 120/72 | HR 68 | Temp 98.2°F | Resp 16 | Wt 253.0 lb

## 2012-04-04 DIAGNOSIS — E785 Hyperlipidemia, unspecified: Secondary | ICD-10-CM

## 2012-04-04 DIAGNOSIS — I10 Essential (primary) hypertension: Secondary | ICD-10-CM

## 2012-04-04 DIAGNOSIS — E119 Type 2 diabetes mellitus without complications: Secondary | ICD-10-CM

## 2012-04-04 MED ORDER — BISOPROLOL-HYDROCHLOROTHIAZIDE 10-6.25 MG PO TABS: 1.0000 | ORAL_TABLET | Freq: Every day | ORAL | Status: DC

## 2012-04-04 MED ORDER — IBUPROFEN 600 MG PO TABS: 600.0000 mg | ORAL_TABLET | Freq: Two times a day (BID) | ORAL | Status: DC | PRN

## 2012-04-04 MED ORDER — FLUTICASONE PROPIONATE 50 MCG/ACT NA SUSP: 1.0000 | Freq: Every day | NASAL | Status: DC

## 2012-04-04 NOTE — Assessment & Plan Note (Signed)
Continue with current prescription therapy as reflected on the Med list.  

## 2012-04-04 NOTE — Assessment & Plan Note (Addendum)
Worse - discussed diet. We may need to add Actos He needs an eye exam

## 2012-04-04 NOTE — Progress Notes (Signed)
Subjective:    Patient ID: Gary Fowler, male    DOB: [birth year], 51 y.o.   MRN: [id number]  HPI   The patient needs to address his chronic hypertension that has been well controlled with medicines; to address chronic  hyperlipidemia controlled with medicines as well; and to address type 2 chronic diabetes, controlled better with medical treatment and diet. He will go back to the [facility], then stopped  Wt Readings from Last 3 Encounters:  04/04/12 253 lb (114.76 kg)  11/19/11 256 lb (116.121 kg)  07/01/11 255 lb (115.667 kg)   BP Readings from Last 3 Encounters:  04/04/12 120/72  11/19/11 140/90  07/01/11 138/90      Review of Systems  Constitutional: Negative for diaphoresis, appetite change, fatigue and unexpected weight change.  HENT: Negative for hearing loss, nosebleeds, congestion, sore throat, sneezing, trouble swallowing and neck pain.   Eyes: Negative for itching and visual disturbance.  Respiratory: Negative for cough, chest tightness and stridor.   Cardiovascular: Negative for chest pain, palpitations and leg swelling.  Gastrointestinal: Negative for nausea, diarrhea, blood in stool and abdominal distention.  Genitourinary: Negative for dysuria, frequency, hematuria and scrotal swelling.  Musculoskeletal: Negative for back pain, joint swelling and gait problem.  Skin: Negative for pallor, rash and wound.  Neurological: Negative for dizziness, tremors, speech difficulty, weakness and headaches.  Psychiatric/Behavioral: Negative for suicidal ideas, behavioral problems, confusion, disturbed wake/sleep cycle, dysphoric mood and agitation. The patient is not nervous/anxious.        Objective:   Physical Exam  Constitutional: He is oriented to person, place, and time. He appears well-developed and well-nourished. No distress.       Lost 2 lbs  HENT:  Head: Normocephalic and atraumatic.  Right Ear: External ear normal.  Left Ear: External ear normal.  Nose: Nose  normal.  Mouth/Throat: Oropharynx is clear and moist. No oropharyngeal exudate.  Eyes: Conjunctivae and EOM are normal. Pupils are equal, round, and reactive to light. Right eye exhibits no discharge. Left eye exhibits no discharge. No scleral icterus.  Neck: Normal range of motion. Neck supple. No JVD present. No tracheal deviation present. No thyromegaly present.  Cardiovascular: Normal rate, regular rhythm, normal heart sounds and intact distal pulses.  Exam reveals no gallop and no friction rub.   No murmur heard. Pulmonary/Chest: Effort normal and breath sounds normal. No stridor. No respiratory distress. He has no wheezes. He has no rales. He exhibits no tenderness.  Abdominal: Soft. Bowel sounds are normal. He exhibits no distension and no mass. There is no tenderness. There is no rebound and no guarding.  Musculoskeletal: Normal range of motion. He exhibits no edema and no tenderness.  Lymphadenopathy:    He has no cervical adenopathy.  Neurological: He is alert and oriented to person, place, and time. He has normal reflexes. No cranial nerve deficit. He exhibits normal muscle tone. Coordination normal.  Skin: Skin is warm and dry. No rash noted. He is not diaphoretic. No erythema. No pallor.  Psychiatric: He has a normal mood and affect. His behavior is normal. Judgment and thought content normal.   Lab Results  Component Value Date   WBC 9.4 11/06/2010   HGB 15.1 11/06/2010   HCT 43.6 11/06/2010   PLT 164.0 11/06/2010   CHOL 101 11/15/2011   TRIG 142.0 11/15/2011   HDL 29.20* 11/15/2011   LDLDIRECT 66.8 11/06/2010   ALT 27 11/15/2011   AST 28 11/15/2011   NA 138 03/28/2012   K 3.4*  03/28/2012   CL 102 03/28/2012   CREATININE 0.8 03/28/2012   BUN 12 03/28/2012   CO2 26 03/28/2012   TSH 1.92 11/15/2011   PSA 0.55 11/15/2011   HGBA1C 7.8* 03/28/2012          Assessment & Plan:

## 2012-08-01 ENCOUNTER — Other Ambulatory Visit (INDEPENDENT_AMBULATORY_CARE_PROVIDER_SITE_OTHER): Payer: BC Managed Care – PPO

## 2012-08-01 DIAGNOSIS — I10 Essential (primary) hypertension: Secondary | ICD-10-CM

## 2012-08-01 DIAGNOSIS — E119 Type 2 diabetes mellitus without complications: Secondary | ICD-10-CM

## 2012-08-01 LAB — BASIC METABOLIC PANEL
CO2: 27 mEq/L (ref 19–32)
Chloride: 104 mEq/L (ref 96–112)
Glucose, Bld: 201 mg/dL — ABNORMAL HIGH (ref 70–99)
Potassium: 3.7 mEq/L (ref 3.5–5.1)
Sodium: 140 mEq/L (ref 135–145)

## 2012-08-01 LAB — HEMOGLOBIN A1C: Hgb A1c MFr Bld: 8.4 % — ABNORMAL HIGH (ref 4.6–6.5)

## 2012-08-07 ENCOUNTER — Ambulatory Visit (INDEPENDENT_AMBULATORY_CARE_PROVIDER_SITE_OTHER): Payer: BC Managed Care – PPO | Admitting: Internal Medicine

## 2012-08-07 ENCOUNTER — Encounter: Payer: Self-pay | Admitting: Internal Medicine

## 2012-08-07 VITALS — BP 118/70 | HR 72 | Temp 98.0°F | Resp 16 | Wt 257.0 lb

## 2012-08-07 DIAGNOSIS — Z0000 Encounter for general adult medical examination without abnormal findings: Secondary | ICD-10-CM

## 2012-08-07 DIAGNOSIS — E119 Type 2 diabetes mellitus without complications: Secondary | ICD-10-CM

## 2012-08-07 DIAGNOSIS — I10 Essential (primary) hypertension: Secondary | ICD-10-CM

## 2012-08-07 DIAGNOSIS — M545 Low back pain, unspecified: Secondary | ICD-10-CM

## 2012-08-07 DIAGNOSIS — E669 Obesity, unspecified: Secondary | ICD-10-CM

## 2012-08-07 MED ORDER — ALOGLIPTIN-PIOGLITAZONE 25-30 MG PO TABS: 1.0000 | ORAL_TABLET | Freq: Every day | ORAL | Status: DC

## 2012-08-07 MED ORDER — GLIMEPIRIDE 2 MG PO TABS: 2.0000 mg | ORAL_TABLET | Freq: Two times a day (BID) | ORAL | Status: DC

## 2012-08-07 MED ORDER — IBUPROFEN 600 MG PO TABS: 600.0000 mg | ORAL_TABLET | Freq: Two times a day (BID) | ORAL | Status: DC | PRN

## 2012-08-07 MED ORDER — METFORMIN HCL 1000 MG PO TABS: 1000.0000 mg | ORAL_TABLET | Freq: Two times a day (BID) | ORAL | Status: DC

## 2012-08-07 NOTE — Progress Notes (Signed)
Subjective:    Patient ID: Gary Fowler, male    DOB: [birth year], 51 y.o.   MRN: [id number]  HPI   The patient needs to address his chronic hypertension that has been well controlled with medicines; to address chronic  hyperlipidemia controlled with medicines as well; and to address type 2 chronic diabetes, controlled better with medical treatment and diet. He will go back to the [facility], then stopped  Wt Readings from Last 3 Encounters:  08/07/12 257 lb (116.574 kg)  04/04/12 253 lb (114.76 kg)  11/19/11 256 lb (116.121 kg)   BP Readings from Last 3 Encounters:  08/07/12 118/70  04/04/12 120/72  11/19/11 140/90      Review of Systems  Constitutional: Negative for diaphoresis, appetite change, fatigue and unexpected weight change.  HENT: Negative for hearing loss, nosebleeds, congestion, sore throat, sneezing, trouble swallowing and neck pain.   Eyes: Negative for itching and visual disturbance.  Respiratory: Negative for cough, chest tightness and stridor.   Cardiovascular: Negative for chest pain, palpitations and leg swelling.  Gastrointestinal: Negative for nausea, diarrhea, blood in stool and abdominal distention.  Genitourinary: Negative for dysuria, frequency, hematuria and scrotal swelling.  Musculoskeletal: Negative for back pain, joint swelling and gait problem.  Skin: Negative for pallor, rash and wound.  Neurological: Negative for dizziness, tremors, speech difficulty, weakness and headaches.  Psychiatric/Behavioral: Negative for suicidal ideas, behavioral problems, confusion, sleep disturbance, dysphoric mood and agitation. The patient is not nervous/anxious.        Objective:   Physical Exam  Constitutional: He is oriented to person, place, and time. He appears well-developed and well-nourished. No distress.       Lost 2 lbs  HENT:  Head: Normocephalic and atraumatic.  Right Ear: External ear normal.  Left Ear: External ear normal.  Nose: Nose normal.   Mouth/Throat: Oropharynx is clear and moist. No oropharyngeal exudate.  Eyes: Conjunctivae normal and EOM are normal. Pupils are equal, round, and reactive to light. Right eye exhibits no discharge. Left eye exhibits no discharge. No scleral icterus.  Neck: Normal range of motion. Neck supple. No JVD present. No tracheal deviation present. No thyromegaly present.  Cardiovascular: Normal rate, regular rhythm, normal heart sounds and intact distal pulses.  Exam reveals no gallop and no friction rub.   No murmur heard. Pulmonary/Chest: Effort normal and breath sounds normal. No stridor. No respiratory distress. He has no wheezes. He has no rales. He exhibits no tenderness.  Abdominal: Soft. Bowel sounds are normal. He exhibits no distension and no mass. There is no tenderness. There is no rebound and no guarding.  Musculoskeletal: Normal range of motion. He exhibits no edema and no tenderness.  Lymphadenopathy:    He has no cervical adenopathy.  Neurological: He is alert and oriented to person, place, and time. He has normal reflexes. No cranial nerve deficit. He exhibits normal muscle tone. Coordination normal.  Skin: Skin is warm and dry. No rash noted. He is not diaphoretic. No erythema. No pallor.  Psychiatric: He has a normal mood and affect. His behavior is normal. Judgment and thought content normal.   Lab Results  Component Value Date   WBC 9.4 11/06/2010   HGB 15.1 11/06/2010   HCT 43.6 11/06/2010   PLT 164.0 11/06/2010   CHOL 101 11/15/2011   TRIG 142.0 11/15/2011   HDL 29.20* 11/15/2011   LDLDIRECT 66.8 11/06/2010   ALT 27 11/15/2011   AST 28 11/15/2011   NA 140 08/01/2012   K  3.7 08/01/2012   CL 104 08/01/2012   CREATININE 0.9 08/01/2012   BUN 15 08/01/2012   CO2 27 08/01/2012   TSH 1.92 11/15/2011   PSA 0.55 11/15/2011   HGBA1C 8.4* 08/01/2012          Assessment & Plan:

## 2012-08-07 NOTE — Assessment & Plan Note (Signed)
Wt Readings from Last 3 Encounters:  08/07/12 257 lb (116.574 kg)  04/04/12 253 lb (114.76 kg)  11/19/11 256 lb (116.121 kg)

## 2012-08-07 NOTE — Assessment & Plan Note (Signed)
Continue with current prescription therapy as reflected on the Med list.  

## 2012-08-07 NOTE — Assessment & Plan Note (Signed)
Doing well 

## 2012-08-07 NOTE — Patient Instructions (Signed)
Wt Readings from Last 3 Encounters:  08/07/12 257 lb (116.574 kg)  04/04/12 253 lb (114.76 kg)  11/19/11 256 lb (116.121 kg)    

## 2012-08-07 NOTE — Assessment & Plan Note (Addendum)
See meds start Oseni

## 2012-11-06 ENCOUNTER — Other Ambulatory Visit: Payer: Self-pay | Admitting: Internal Medicine

## 2012-11-20 ENCOUNTER — Other Ambulatory Visit (INDEPENDENT_AMBULATORY_CARE_PROVIDER_SITE_OTHER): Payer: BC Managed Care – PPO

## 2012-11-20 DIAGNOSIS — E119 Type 2 diabetes mellitus without complications: Secondary | ICD-10-CM

## 2012-11-20 DIAGNOSIS — Z0000 Encounter for general adult medical examination without abnormal findings: Secondary | ICD-10-CM

## 2012-11-20 LAB — CBC WITH DIFFERENTIAL/PLATELET
Basophils Absolute: 0 10*3/uL (ref 0.0–0.1)
Hemoglobin: 14.5 g/dL (ref 13.0–17.0)
Lymphocytes Relative: 35.3 % (ref 12.0–46.0)
Monocytes Relative: 4.9 % (ref 3.0–12.0)
Neutro Abs: 4.3 10*3/uL (ref 1.4–7.7)
Platelets: 152 10*3/uL (ref 150.0–400.0)
RDW: 13.4 % (ref 11.5–14.6)

## 2012-11-20 LAB — LIPID PANEL
HDL: 29.1 mg/dL — ABNORMAL LOW (ref >39.00)
Total CHOL/HDL Ratio: 4
Triglycerides: 205 mg/dL — ABNORMAL HIGH (ref 0.0–149.0)
VLDL: 41 mg/dL — ABNORMAL HIGH (ref 0.0–40.0)

## 2012-11-20 LAB — URINALYSIS
Bilirubin Urine: NEGATIVE
Ketones, ur: NEGATIVE
Total Protein, Urine: NEGATIVE
Urine Glucose: NEGATIVE
pH: 6 (ref 5.0–8.0)

## 2012-11-20 LAB — BASIC METABOLIC PANEL
BUN: 17 mg/dL (ref 6–23)
Calcium: 9.1 mg/dL (ref 8.4–10.5)
Chloride: 105 mEq/L (ref 96–112)
Creatinine, Ser: 0.9 mg/dL (ref 0.4–1.5)

## 2012-11-20 LAB — HEMOGLOBIN A1C: Hgb A1c MFr Bld: 6.6 % — ABNORMAL HIGH (ref 4.6–6.5)

## 2012-11-20 LAB — HEPATIC FUNCTION PANEL
ALT: 24 U/L (ref 0–53)
Total Bilirubin: 0.7 mg/dL (ref 0.3–1.2)

## 2012-11-24 ENCOUNTER — Encounter: Payer: Self-pay | Admitting: Internal Medicine

## 2012-11-24 ENCOUNTER — Ambulatory Visit (INDEPENDENT_AMBULATORY_CARE_PROVIDER_SITE_OTHER): Payer: BC Managed Care – PPO | Admitting: Internal Medicine

## 2012-11-24 VITALS — BP 120/70 | HR 76 | Temp 98.2°F | Resp 16 | Ht 69.0 in | Wt 253.0 lb

## 2012-11-24 DIAGNOSIS — M545 Low back pain, unspecified: Secondary | ICD-10-CM

## 2012-11-24 DIAGNOSIS — H6120 Impacted cerumen, unspecified ear: Secondary | ICD-10-CM

## 2012-11-24 DIAGNOSIS — I10 Essential (primary) hypertension: Secondary | ICD-10-CM

## 2012-11-24 DIAGNOSIS — E669 Obesity, unspecified: Secondary | ICD-10-CM

## 2012-11-24 DIAGNOSIS — E119 Type 2 diabetes mellitus without complications: Secondary | ICD-10-CM

## 2012-11-24 DIAGNOSIS — E785 Hyperlipidemia, unspecified: Secondary | ICD-10-CM

## 2012-11-24 DIAGNOSIS — L570 Actinic keratosis: Secondary | ICD-10-CM

## 2012-11-24 DIAGNOSIS — Z0000 Encounter for general adult medical examination without abnormal findings: Secondary | ICD-10-CM

## 2012-11-24 DIAGNOSIS — H6121 Impacted cerumen, right ear: Secondary | ICD-10-CM

## 2012-11-24 DIAGNOSIS — Z23 Encounter for immunization: Secondary | ICD-10-CM

## 2012-11-24 NOTE — Assessment & Plan Note (Signed)
We discussed age appropriate health related issues, including available/recomended screening tests and vaccinations. We discussed a need for adhering to healthy diet and exercise. Labs/EKG were reviewed/ordered. All questions were answered.   

## 2012-11-24 NOTE — Assessment & Plan Note (Signed)
Better  

## 2012-11-24 NOTE — Assessment & Plan Note (Signed)
Will irrigate 

## 2012-11-24 NOTE — Assessment & Plan Note (Signed)
Continue with current prescription therapy as reflected on the Med list.  

## 2012-11-24 NOTE — Assessment & Plan Note (Signed)
Chronic  H/o back surgery x 3 in the 90s

## 2012-11-24 NOTE — Progress Notes (Signed)
Subjective:    HPI  The patient is here for a wellness exam. The patient has been doing well overall without major physical or psychological issues going on lately. The patient needs to address his chronic hypertension that has been well controlled with medicines; to address chronic  hyperlipidemia controlled with medicines as well; and to address type 2 chronic diabetes, controlled better with medical treatment and diet. C/o R ear discomfort and skin lesions.  Wt Readings from Last 3 Encounters:  11/24/12 253 lb (114.76 kg)  08/07/12 257 lb (116.574 kg)  04/04/12 253 lb (114.76 kg)   BP Readings from Last 3 Encounters:  11/24/12 120/70  08/07/12 118/70  04/04/12 120/72      Review of Systems  Constitutional: Negative for diaphoresis, appetite change, fatigue and unexpected weight change.  HENT: Negative for hearing loss, nosebleeds, congestion, sore throat, sneezing, trouble swallowing and neck pain.   Eyes: Negative for itching and visual disturbance.  Respiratory: Negative for cough, chest tightness and stridor.   Cardiovascular: Negative for chest pain, palpitations and leg swelling.  Gastrointestinal: Negative for nausea, diarrhea, blood in stool and abdominal distention.  Genitourinary: Negative for dysuria, frequency, hematuria and scrotal swelling.  Musculoskeletal: Negative for back pain, joint swelling and gait problem.  Skin: Negative for pallor, rash and wound.  Neurological: Negative for dizziness, tremors, speech difficulty, weakness and headaches.  Psychiatric/Behavioral: Negative for behavioral problems, confusion, sleep disturbance, dysphoric mood and agitation. The patient is not nervous/anxious.        Objective:   Physical Exam  Constitutional: He is oriented to person, place, and time. He appears well-developed. No distress.  Obese   HENT:  Head: Normocephalic and atraumatic.  Right Ear: External ear normal.  Left Ear: External ear normal.  Nose:  Nose normal.  Mouth/Throat: Oropharynx is clear and moist. No oropharyngeal exudate.  Eyes: Conjunctivae and EOM are normal. Pupils are equal, round, and reactive to light. Right eye exhibits no discharge. Left eye exhibits no discharge. No scleral icterus.  Neck: Normal range of motion. Neck supple. No JVD present. No tracheal deviation present. No thyromegaly present.  Cardiovascular: Normal rate, regular rhythm, normal heart sounds and intact distal pulses.  Exam reveals no gallop and no friction rub.   No murmur heard. Pulmonary/Chest: Effort normal and breath sounds normal. No stridor. No respiratory distress. He has no wheezes. He has no rales. He exhibits no tenderness.  Abdominal: Soft. Bowel sounds are normal. He exhibits no distension and no mass. There is no tenderness. There is no rebound and no guarding.  Genitourinary: Rectum normal, prostate normal and penis normal. Guaiac negative stool.  Rectal and prostate - WNL  Musculoskeletal: Normal range of motion. He exhibits no edema and no tenderness.  Lymphadenopathy:    He has no cervical adenopathy.  Neurological: He is alert and oriented to person, place, and time. He has normal reflexes. No cranial nerve deficit. He exhibits normal muscle tone. Coordination normal.  Skin: Skin is warm and dry. No rash noted. He is not diaphoretic. No erythema. No pallor.  Lower back growth - tag AKs  Psychiatric: He has a normal mood and affect. His behavior is normal. Judgment and thought content normal.  wax R ear Lab Results  Component Value Date   WBC 7.7 11/20/2012   HGB 14.5 11/20/2012   HCT 42.8 11/20/2012   PLT 152.0 11/20/2012   CHOL 119 11/20/2012   TRIG 205.0* 11/20/2012   HDL 29.10* 11/20/2012   LDLDIRECT 56.3 11/20/2012  ALT 24 11/20/2012   AST 28 11/20/2012   NA 141 11/20/2012   K 4.4 11/20/2012   CL 105 11/20/2012   CREATININE 0.9 11/20/2012   BUN 17 11/20/2012   CO2 26 11/20/2012   TSH 1.92 11/15/2011   PSA 0.56 11/20/2012   HGBA1C 6.6* 11/20/2012     Procedure Note :     Procedure : Cryosurgery   Indication:   Actinic keratosis(es)   Risks including unsuccessful procedure , bleeding, infection, bruising, scar, a need for a repeat  procedure and others were explained to the patient in detail as well as the benefits. Informed consent was obtained verbally.     4 lesion(s)  On R forearm and 4 on face was/were treated with liquid nitrogen on a Q-tip in a usual fasion . Band-Aid was applied and antibiotic ointment was given for a later use.   Tolerated well. Complications none.   Postprocedure instructions :     Keep the wounds clean. You can wash them with liquid soap and water. Pat dry with gauze or a Kleenex tissue  Before applying antibiotic ointment and a Band-Aid.   You need to report immediately  if  any signs of infection develop.    Procedure Note :     Procedure :  Ear irrigation   Indication:  Cerumen impaction R   Risks, including pain, dizziness, eardrum perforation, bleeding, infection and others as well as benefits were explained to the patient in detail. Verbal consent was obtained and the patient agreed to proceed.    We used "The Elephant Ear Irrigation Device" filled with lukewarm water for irrigation. A large amount wax was recovered. Procedure has also required manual wax removal with an ear loop.   Tolerated well. Complications: None.   Postprocedure instructions :  Call if problems.         Assessment & Plan:

## 2012-11-24 NOTE — Addendum Note (Signed)
Addended by: Merrilyn Puma on: 11/24/2012 11:21 AM   Modules accepted: Orders

## 2012-11-24 NOTE — Assessment & Plan Note (Addendum)
x4 face x4 R forearm  Cryo

## 2013-03-22 ENCOUNTER — Other Ambulatory Visit (INDEPENDENT_AMBULATORY_CARE_PROVIDER_SITE_OTHER): Payer: BC Managed Care – PPO

## 2013-03-22 ENCOUNTER — Other Ambulatory Visit: Payer: Self-pay | Admitting: *Deleted

## 2013-03-22 ENCOUNTER — Telehealth: Payer: Self-pay | Admitting: Internal Medicine

## 2013-03-22 DIAGNOSIS — E785 Hyperlipidemia, unspecified: Secondary | ICD-10-CM

## 2013-03-22 DIAGNOSIS — I10 Essential (primary) hypertension: Secondary | ICD-10-CM

## 2013-03-22 DIAGNOSIS — E119 Type 2 diabetes mellitus without complications: Secondary | ICD-10-CM

## 2013-03-22 LAB — LIPID PANEL
HDL: 36.7 mg/dL — ABNORMAL LOW (ref >39.00)
LDL Cholesterol: 64 mg/dL (ref 0–99)
Total CHOL/HDL Ratio: 4
Triglycerides: 153 mg/dL — ABNORMAL HIGH (ref 0.0–149.0)

## 2013-03-22 LAB — CBC WITH DIFFERENTIAL/PLATELET
Basophils Relative: 0.2 % (ref 0.0–3.0)
Eosinophils Relative: 2.1 % (ref 0.0–5.0)
Lymphocytes Relative: 25.6 % (ref 12.0–46.0)
Monocytes Relative: 4.8 % (ref 3.0–12.0)
Neutrophils Relative %: 67.3 % (ref 43.0–77.0)
RBC: 5.15 Mil/uL (ref 4.22–5.81)
WBC: 8.5 10*3/uL (ref 4.5–10.5)

## 2013-03-22 LAB — BASIC METABOLIC PANEL
Chloride: 104 mEq/L (ref 96–112)
GFR: 95.94 mL/min (ref >60.00)
Potassium: 3.6 mEq/L (ref 3.5–5.1)
Sodium: 140 mEq/L (ref 135–145)

## 2013-03-22 LAB — HEMOGLOBIN A1C: Hgb A1c MFr Bld: 6.8 % — ABNORMAL HIGH (ref 4.6–6.5)

## 2013-03-22 NOTE — Telephone Encounter (Signed)
Labs entered. Pt informed by [employer].

## 2013-03-22 NOTE — Telephone Encounter (Signed)
Patient came in to do labs but no order was in the system.  Gary Fowler has an appointment next week.  Gary Fowler states that Dr. Posey Rea told him to do labs before the appointment.  Please give a call to let know when Gary Fowler is able to do labs.

## 2013-03-30 ENCOUNTER — Encounter: Payer: Self-pay | Admitting: Internal Medicine

## 2013-03-30 ENCOUNTER — Ambulatory Visit (INDEPENDENT_AMBULATORY_CARE_PROVIDER_SITE_OTHER): Payer: BC Managed Care – PPO | Admitting: Internal Medicine

## 2013-03-30 VITALS — BP 118/80 | HR 63 | Temp 98.1°F | Wt 257.0 lb

## 2013-03-30 DIAGNOSIS — E785 Hyperlipidemia, unspecified: Secondary | ICD-10-CM

## 2013-03-30 DIAGNOSIS — I10 Essential (primary) hypertension: Secondary | ICD-10-CM

## 2013-03-30 DIAGNOSIS — E119 Type 2 diabetes mellitus without complications: Secondary | ICD-10-CM

## 2013-03-30 MED ORDER — TRIAMCINOLONE ACETONIDE 0.5 % EX CREA: TOPICAL_CREAM | Freq: Three times a day (TID) | CUTANEOUS | Status: DC

## 2013-03-30 NOTE — Assessment & Plan Note (Signed)
Continue with current prescription therapy as reflected on the Med list.  

## 2013-03-30 NOTE — Progress Notes (Signed)
Subjective:    HPI   The patient needs to address his chronic hypertension that has been well controlled with medicines; to address chronic  hyperlipidemia controlled with medicines as well; and to address type 2 chronic diabetes, controlled better with medical treatment and diet. C/o R shoulder pain C/o rash R side   Wt Readings from Last 3 Encounters:  03/30/13 257 lb (116.574 kg)  11/24/12 253 lb (114.76 kg)  08/07/12 257 lb (116.574 kg)   BP Readings from Last 3 Encounters:  03/30/13 118/80  11/24/12 120/70  08/07/12 118/70      Review of Systems  Constitutional: Negative for diaphoresis, appetite change, fatigue and unexpected weight change.  HENT: Negative for hearing loss, nosebleeds, congestion, sore throat, sneezing, trouble swallowing and neck pain.   Eyes: Negative for itching and visual disturbance.  Respiratory: Negative for cough, chest tightness and stridor.   Cardiovascular: Negative for chest pain, palpitations and leg swelling.  Gastrointestinal: Negative for nausea, diarrhea, blood in stool and abdominal distention.  Genitourinary: Negative for dysuria, frequency, hematuria and scrotal swelling.  Musculoskeletal: Negative for back pain, joint swelling and gait problem.  Skin: Negative for pallor, rash and wound.  Neurological: Negative for dizziness, tremors, speech difficulty, weakness and headaches.  Psychiatric/Behavioral: Negative for suicidal ideas, behavioral problems, confusion, sleep disturbance, dysphoric mood and agitation. The patient is not nervous/anxious.        Objective:   Physical Exam  Constitutional: He is oriented to person, place, and time. He appears well-developed and well-nourished. No distress.  Lost 2 lbs  HENT:  Head: Normocephalic and atraumatic.  Right Ear: External ear normal.  Left Ear: External ear normal.  Nose: Nose normal.  Mouth/Throat: Oropharynx is clear and moist. No oropharyngeal exudate.  Eyes: Conjunctivae  and EOM are normal. Pupils are equal, round, and reactive to light. Right eye exhibits no discharge. Left eye exhibits no discharge. No scleral icterus.  Neck: Normal range of motion. Neck supple. No JVD present. No tracheal deviation present. No thyromegaly present.  Cardiovascular: Normal rate, regular rhythm, normal heart sounds and intact distal pulses.  Exam reveals no gallop and no friction rub.   No murmur heard. Pulmonary/Chest: Effort normal and breath sounds normal. No stridor. No respiratory distress. He has no wheezes. He has no rales. He exhibits no tenderness.  Abdominal: Soft. Bowel sounds are normal. He exhibits no distension and no mass. There is no tenderness. There is no rebound and no guarding.  Musculoskeletal: Normal range of motion. He exhibits no edema and no tenderness.  Lymphadenopathy:    He has no cervical adenopathy.  Neurological: He is alert and oriented to person, place, and time. He has normal reflexes. No cranial nerve deficit. He exhibits normal muscle tone. Coordination normal.  Skin: Skin is warm and dry. No rash noted. He is not diaphoretic. No erythema. No pallor.  Psychiatric: He has a normal mood and affect. His behavior is normal. Judgment and thought content normal.   Lab Results  Component Value Date   WBC 8.5 03/22/2013   HGB 15.1 03/22/2013   HCT 45.1 03/22/2013   PLT 157.0 03/22/2013   CHOL 131 03/22/2013   TRIG 153.0* 03/22/2013   HDL 36.70* 03/22/2013   LDLDIRECT 56.3 11/20/2012   ALT 24 11/20/2012   AST 28 11/20/2012   NA 140 03/22/2013   K 3.6 03/22/2013   CL 104 03/22/2013   CREATININE 0.9 03/22/2013   BUN 12 03/22/2013   CO2 29 03/22/2013  TSH 1.92 11/15/2011   PSA 0.56 11/20/2012   HGBA1C 6.8* 03/22/2013          Assessment & Plan:

## 2013-03-30 NOTE — Patient Instructions (Signed)
[email or web address]  

## 2013-06-12 ENCOUNTER — Other Ambulatory Visit: Payer: Self-pay | Admitting: Internal Medicine

## 2013-07-20 ENCOUNTER — Other Ambulatory Visit (INDEPENDENT_AMBULATORY_CARE_PROVIDER_SITE_OTHER): Payer: BC Managed Care – PPO

## 2013-07-20 DIAGNOSIS — E119 Type 2 diabetes mellitus without complications: Secondary | ICD-10-CM

## 2013-07-20 DIAGNOSIS — I10 Essential (primary) hypertension: Secondary | ICD-10-CM

## 2013-07-20 LAB — BASIC METABOLIC PANEL
GFR: 91.03 mL/min (ref >60.00)
Potassium: 3.9 mEq/L (ref 3.5–5.1)
Sodium: 138 mEq/L (ref 135–145)

## 2013-07-20 LAB — HEMOGLOBIN A1C: Hgb A1c MFr Bld: 6.9 % — ABNORMAL HIGH (ref 4.6–6.5)

## 2013-07-27 ENCOUNTER — Encounter: Payer: Self-pay | Admitting: Internal Medicine

## 2013-07-27 ENCOUNTER — Ambulatory Visit (INDEPENDENT_AMBULATORY_CARE_PROVIDER_SITE_OTHER): Payer: BC Managed Care – PPO | Admitting: Internal Medicine

## 2013-07-27 VITALS — BP 130/84 | HR 80 | Temp 98.7°F | Resp 16 | Wt 254.0 lb

## 2013-07-27 DIAGNOSIS — I10 Essential (primary) hypertension: Secondary | ICD-10-CM

## 2013-07-27 DIAGNOSIS — L570 Actinic keratosis: Secondary | ICD-10-CM

## 2013-07-27 DIAGNOSIS — E119 Type 2 diabetes mellitus without complications: Secondary | ICD-10-CM

## 2013-07-27 DIAGNOSIS — E669 Obesity, unspecified: Secondary | ICD-10-CM

## 2013-07-27 DIAGNOSIS — D485 Neoplasm of uncertain behavior of skin: Secondary | ICD-10-CM

## 2013-07-27 DIAGNOSIS — J069 Acute upper respiratory infection, unspecified: Secondary | ICD-10-CM

## 2013-07-27 MED ORDER — PROMETHAZINE-CODEINE 6.25-10 MG/5ML PO SYRP: 5.0000 mL | ORAL_SOLUTION | ORAL | Status: DC | PRN

## 2013-07-27 NOTE — Progress Notes (Signed)
Patient ID: Gary Fowler, male   DOB: 01/23/1961, 52 y.o.   MRN: [id number]   Subjective:    HPI   The patient needs to address his chronic hypertension that has been well controlled with medicines; to address chronic  hyperlipidemia controlled with medicines as well; and to address type 2 chronic diabetes, controlled better with medical treatment and diet. C/o R shoulder pain C/o cough x 2 d - bad C/o R shoulder surgery 3 wks ago   Wt Readings from Last 3 Encounters:  07/27/13 254 lb (115.214 kg)  03/30/13 257 lb (116.574 kg)  11/24/12 253 lb (114.76 kg)   BP Readings from Last 3 Encounters:  07/27/13 130/84  03/30/13 118/80  11/24/12 120/70      Review of Systems  Constitutional: Negative for diaphoresis, appetite change, fatigue and unexpected weight change.  HENT: Negative for congestion, hearing loss, nosebleeds, sneezing, sore throat and trouble swallowing.   Eyes: Negative for itching and visual disturbance.  Respiratory: Negative for cough, chest tightness and stridor.   Cardiovascular: Negative for chest pain, palpitations and leg swelling.  Gastrointestinal: Negative for nausea, diarrhea, blood in stool and abdominal distention.  Genitourinary: Negative for dysuria, frequency, hematuria and scrotal swelling.  Musculoskeletal: Negative for back pain, gait problem, joint swelling and neck pain.  Skin: Negative for pallor, rash and wound.  Neurological: Negative for dizziness, tremors, speech difficulty, weakness and headaches.  Psychiatric/Behavioral: Negative for suicidal ideas, behavioral problems, confusion, sleep disturbance, dysphoric mood and agitation. The patient is not nervous/anxious.        Objective:   Physical Exam  Constitutional: He is oriented to person, place, and time. He appears well-developed and well-nourished. No distress.  Lost 2 lbs  HENT:  Head: Normocephalic and atraumatic.  Right Ear: External ear normal.  Left Ear: External ear  normal.  Nose: Nose normal.  Mouth/Throat: Oropharynx is clear and moist. No oropharyngeal exudate.  Eyes: Conjunctivae and EOM are normal. Pupils are equal, round, and reactive to light. Right eye exhibits no discharge. Left eye exhibits no discharge. No scleral icterus.  Neck: Normal range of motion. Neck supple. No JVD present. No tracheal deviation present. No thyromegaly present.  Cardiovascular: Normal rate, regular rhythm, normal heart sounds and intact distal pulses.  Exam reveals no gallop and no friction rub.   No murmur heard. Pulmonary/Chest: Effort normal and breath sounds normal. No stridor. No respiratory distress. He has no wheezes. He has no rales. He exhibits no tenderness.  Abdominal: Soft. Bowel sounds are normal. He exhibits no distension and no mass. There is no tenderness. There is no rebound and no guarding.  Musculoskeletal: Normal range of motion. He exhibits no edema and no tenderness.  Lymphadenopathy:    He has no cervical adenopathy.  Neurological: He is alert and oriented to person, place, and time. He has normal reflexes. No cranial nerve deficit. He exhibits normal muscle tone. Coordination normal.  Skin: Skin is warm and dry. No rash noted. He is not diaphoretic. No erythema. No pallor.  Psychiatric: He has a normal mood and affect. His behavior is normal. Judgment and thought content normal.   Lab Results  Component Value Date   WBC 8.5 03/22/2013   HGB 15.1 03/22/2013   HCT 45.1 03/22/2013   PLT 157.0 03/22/2013   CHOL 131 03/22/2013   TRIG 153.0* 03/22/2013   HDL 36.70* 03/22/2013   LDLDIRECT 56.3 11/20/2012   ALT 24 11/20/2012   AST 28 11/20/2012   NA 138 07/20/2013  K 3.9 07/20/2013   CL 102 07/20/2013   CREATININE 0.9 07/20/2013   BUN 13 07/20/2013   CO2 31 07/20/2013   TSH 1.92 11/15/2011   PSA 0.56 11/20/2012   HGBA1C 6.9* 07/20/2013       Procedure Note :     Procedure : Cryosurgery   Indication:  Wart(s)  Actinic keratosis(es)   Risks including unsuccessful  procedure , bleeding, infection, bruising, scar, a need for a repeat  procedure and others were explained to the patient in detail as well as the benefits. Informed consent was obtained verbally.    1 lesion(s)  on R temple   was/were treated with liquid nitrogen on a Q-tip in a usual fasion . Band-Aid was applied and antibiotic ointment was given for a later use.   Tolerated well. Complications none.        Assessment & Plan:

## 2013-07-27 NOTE — Assessment & Plan Note (Signed)
Prom-cod 

## 2013-07-27 NOTE — Progress Notes (Signed)
Pre visit review using our clinic review tool, if applicable. No additional management support is needed unless otherwise documented below in the visit note. 

## 2013-07-27 NOTE — Assessment & Plan Note (Signed)
Continue with current prescription therapy as reflected on the Med list.  

## 2013-07-27 NOTE — Assessment & Plan Note (Signed)
Skin bx 

## 2013-07-27 NOTE — Patient Instructions (Signed)
Use over-the-counter  "cold" medicines  such as  "Afrin" nasal spray for nasal congestion as directed instead. Use" Delsym" or" Robitussin" cough syrup varietis for cough.  You can use plain "Tylenol" or "Advi"l for fever, chills and achyness.   "Common cold" symptoms are usually triggered by a virus.  The antibiotics are usually not necessary. On average, a" viral cold" illness would take 4-7 days to resolve. Please, make an appointment if you are not better or if you're worse.  

## 2013-07-27 NOTE — Assessment & Plan Note (Signed)
Wt Readings from Last 3 Encounters:  07/27/13 254 lb (115.214 kg)  03/30/13 257 lb (116.574 kg)  11/24/12 253 lb (114.76 kg)

## 2013-08-04 ENCOUNTER — Other Ambulatory Visit: Payer: Self-pay | Admitting: Internal Medicine

## 2013-08-08 ENCOUNTER — Other Ambulatory Visit: Payer: Self-pay | Admitting: Internal Medicine

## 2013-08-13 ENCOUNTER — Ambulatory Visit (INDEPENDENT_AMBULATORY_CARE_PROVIDER_SITE_OTHER): Payer: BC Managed Care – PPO | Admitting: Internal Medicine

## 2013-08-13 ENCOUNTER — Encounter: Payer: Self-pay | Admitting: Internal Medicine

## 2013-08-13 VITALS — BP 130/70 | HR 80 | Temp 99.3°F | Resp 16 | Wt 263.0 lb

## 2013-08-13 DIAGNOSIS — D485 Neoplasm of uncertain behavior of skin: Secondary | ICD-10-CM

## 2013-08-13 NOTE — Assessment & Plan Note (Signed)
Skin bx 

## 2013-08-13 NOTE — Progress Notes (Signed)
  Skin bx   Procedure Note :     Procedure :  Skin biopsy   Indication:   Suspicious lesion(s) sacral area   Risks including unsuccessful procedure , bleeding, infection, bruising, scar, a need for another complete procedure and others were explained to the patient in detail as well as the benefits. Informed consent was obtained and signed.   The patient was placed in a decubitus position.  Lesion #1 on sacrum    measuring 4x6    mm   Skin over lesion #1  was prepped with Betadine and alcohol  and anesthetized with 1 cc of 2% lidocaine and epinephrine, using a 25-gauge 1 inch needle.  Shave biopsy with a sterile Dermablade was carried out in the usual fashion. Hyfrecator was used to destroy the rest of the lesion potentially left behind and for hemostasis. Band-Aid was applied with antibiotic ointment.  Multiple skin tags were treated in B axilla w/a hyfrecator (no charge).

## 2013-08-13 NOTE — Progress Notes (Signed)
Pre visit review using our clinic review tool, if applicable. No additional management support is needed unless otherwise documented below in the visit note. 

## 2013-09-22 ENCOUNTER — Other Ambulatory Visit: Payer: Self-pay | Admitting: Internal Medicine

## 2013-09-25 ENCOUNTER — Other Ambulatory Visit: Payer: Self-pay | Admitting: Internal Medicine

## 2013-10-09 ENCOUNTER — Other Ambulatory Visit: Payer: Self-pay | Admitting: Internal Medicine

## 2013-11-12 ENCOUNTER — Other Ambulatory Visit: Payer: Self-pay | Admitting: Internal Medicine

## 2013-11-22 ENCOUNTER — Other Ambulatory Visit: Payer: BC Managed Care – PPO

## 2013-11-22 ENCOUNTER — Telehealth: Payer: Self-pay | Admitting: Internal Medicine

## 2013-11-22 DIAGNOSIS — Z0000 Encounter for general adult medical examination without abnormal findings: Secondary | ICD-10-CM

## 2013-11-22 DIAGNOSIS — E119 Type 2 diabetes mellitus without complications: Secondary | ICD-10-CM

## 2013-11-22 NOTE — Telephone Encounter (Signed)
Pt came in this morning and stated that he has CPE for 11/26/13 and lab work suppose to be enter on 11/22/13 (order is not in the system, and this happened twice). Pt is not very happy about this and request to put the lab work in Monday morning 11/26/13. Please advise.

## 2013-11-22 NOTE — Telephone Encounter (Signed)
Sorry! Done Thx

## 2013-11-26 ENCOUNTER — Encounter: Payer: BC Managed Care – PPO | Admitting: Internal Medicine

## 2013-11-26 ENCOUNTER — Other Ambulatory Visit (INDEPENDENT_AMBULATORY_CARE_PROVIDER_SITE_OTHER): Payer: BC Managed Care – PPO

## 2013-11-26 ENCOUNTER — Ambulatory Visit (INDEPENDENT_AMBULATORY_CARE_PROVIDER_SITE_OTHER): Payer: BC Managed Care – PPO | Admitting: Internal Medicine

## 2013-11-26 ENCOUNTER — Encounter: Payer: Self-pay | Admitting: Internal Medicine

## 2013-11-26 VITALS — BP 120/88 | HR 76 | Temp 97.6°F | Resp 16 | Ht 69.0 in | Wt 260.0 lb

## 2013-11-26 DIAGNOSIS — E119 Type 2 diabetes mellitus without complications: Secondary | ICD-10-CM

## 2013-11-26 DIAGNOSIS — Z0000 Encounter for general adult medical examination without abnormal findings: Secondary | ICD-10-CM

## 2013-11-26 LAB — CBC WITH DIFFERENTIAL/PLATELET
Basophils Absolute: 0 10*3/uL (ref 0.0–0.1)
Basophils Relative: 0.1 % (ref 0.0–3.0)
EOS ABS: 0.2 10*3/uL (ref 0.0–0.7)
EOS PCT: 2.8 % (ref 0.0–5.0)
HCT: 42.3 % (ref 39.0–52.0)
Hemoglobin: 14.4 g/dL (ref 13.0–17.0)
Lymphocytes Relative: 29.1 % (ref 12.0–46.0)
Lymphs Abs: 2.3 10*3/uL (ref 0.7–4.0)
MCHC: 34 g/dL (ref 30.0–36.0)
MCV: 86.4 fl (ref 78.0–100.0)
MONO ABS: 0.6 10*3/uL (ref 0.1–1.0)
Monocytes Relative: 7 % (ref 3.0–12.0)
NEUTROS PCT: 61 % (ref 43.0–77.0)
Neutro Abs: 4.8 10*3/uL (ref 1.4–7.7)
Platelets: 146 10*3/uL — ABNORMAL LOW (ref 150.0–400.0)
RBC: 4.9 Mil/uL (ref 4.22–5.81)
RDW: 13.1 % (ref 11.5–14.6)
WBC: 7.9 10*3/uL (ref 4.5–10.5)

## 2013-11-26 LAB — URINALYSIS
Bilirubin Urine: NEGATIVE
Hgb urine dipstick: NEGATIVE
KETONES UR: NEGATIVE
Leukocytes, UA: NEGATIVE
NITRITE: NEGATIVE
PH: 6 (ref 5.0–8.0)
Specific Gravity, Urine: >=1.03 — AB (ref 1.000–1.030)
TOTAL PROTEIN, URINE-UPE24: NEGATIVE
Urine Glucose: NEGATIVE
Urobilinogen, UA: 0.2 (ref 0.0–1.0)

## 2013-11-26 LAB — BASIC METABOLIC PANEL
BUN: 17 mg/dL (ref 6–23)
CHLORIDE: 105 meq/L (ref 96–112)
CO2: 26 mEq/L (ref 19–32)
CREATININE: 0.7 mg/dL (ref 0.4–1.5)
Calcium: 9.1 mg/dL (ref 8.4–10.5)
GFR: 116.88 mL/min (ref >60.00)
Glucose, Bld: 126 mg/dL — ABNORMAL HIGH (ref 70–99)
Potassium: 4.3 mEq/L (ref 3.5–5.1)
Sodium: 142 mEq/L (ref 135–145)

## 2013-11-26 LAB — LIPID PANEL
CHOLESTEROL: 135 mg/dL (ref 0–200)
HDL: 36.3 mg/dL — ABNORMAL LOW (ref >39.00)
LDL Cholesterol: 65 mg/dL (ref 0–99)
Total CHOL/HDL Ratio: 4
Triglycerides: 168 mg/dL — ABNORMAL HIGH (ref 0.0–149.0)
VLDL: 33.6 mg/dL (ref 0.0–40.0)

## 2013-11-26 LAB — HEPATIC FUNCTION PANEL
ALK PHOS: 59 U/L (ref 39–117)
ALT: 19 U/L (ref 0–53)
AST: 28 U/L (ref 0–37)
Albumin: 4.1 g/dL (ref 3.5–5.2)
BILIRUBIN DIRECT: 0.1 mg/dL (ref 0.0–0.3)
Total Bilirubin: 0.6 mg/dL (ref 0.3–1.2)
Total Protein: 7 g/dL (ref 6.0–8.3)

## 2013-11-26 LAB — PSA: PSA: 0.63 ng/mL (ref 0.10–4.00)

## 2013-11-26 LAB — HEMOGLOBIN A1C: HEMOGLOBIN A1C: 6.9 % — AB (ref 4.6–6.5)

## 2013-11-26 LAB — TSH: TSH: 1.15 u[IU]/mL (ref 0.35–5.50)

## 2013-11-26 NOTE — Assessment & Plan Note (Signed)
We discussed age appropriate health related issues, including available/recomended screening tests and vaccinations. We discussed a need for adhering to healthy diet and exercise. Labs/EKG were reviewed/ordered. All questions were answered.   

## 2013-11-26 NOTE — Progress Notes (Signed)
Subjective:    HPI  The patient is here for a wellness exam. The patient has been doing well overall without major physical or psychological issues going on lately.  The patient needs to address his chronic hypertension that has been well controlled with medicines; to address chronic  hyperlipidemia controlled with medicines as well; and to address type 2 chronic diabetes, controlled better with medical treatment and diet. F/u R shoulder surgery 3 wks ago - better after a recent cortisone shot.   Wt Readings from Last 3 Encounters:  11/26/13 260 lb (117.935 kg)  08/13/13 263 lb (119.296 kg)  07/27/13 254 lb (115.214 kg)   BP Readings from Last 3 Encounters:  11/26/13 120/88  08/13/13 130/70  07/27/13 130/84      Review of Systems  Constitutional: Negative for diaphoresis, appetite change, fatigue and unexpected weight change.  HENT: Negative for congestion, hearing loss, nosebleeds, sneezing, sore throat and trouble swallowing.   Eyes: Negative for itching and visual disturbance.  Respiratory: Negative for cough, chest tightness and stridor.   Cardiovascular: Negative for chest pain, palpitations and leg swelling.  Gastrointestinal: Negative for nausea, diarrhea, blood in stool and abdominal distention.  Genitourinary: Negative for dysuria, frequency, hematuria and scrotal swelling.  Musculoskeletal: Negative for back pain, gait problem, joint swelling and neck pain.  Skin: Negative for pallor, rash and wound.  Neurological: Negative for dizziness, tremors, speech difficulty, weakness and headaches.  Psychiatric/Behavioral: Negative for suicidal ideas, behavioral problems, confusion, sleep disturbance, dysphoric mood and agitation. The patient is not nervous/anxious.        Objective:   Physical Exam  Constitutional: He is oriented to person, place, and time. He appears well-developed and well-nourished. No distress.  Lost 2 lbs  HENT:  Head: Normocephalic and  atraumatic.  Right Ear: External ear normal.  Left Ear: External ear normal.  Nose: Nose normal.  Mouth/Throat: Oropharynx is clear and moist. No oropharyngeal exudate.  Eyes: Conjunctivae and EOM are normal. Pupils are equal, round, and reactive to light. Right eye exhibits no discharge. Left eye exhibits no discharge. No scleral icterus.  Neck: Normal range of motion. Neck supple. No JVD present. No tracheal deviation present. No thyromegaly present.  Cardiovascular: Normal rate, regular rhythm, normal heart sounds and intact distal pulses.  Exam reveals no gallop and no friction rub.   No murmur heard. Pulmonary/Chest: Effort normal and breath sounds normal. No stridor. No respiratory distress. He has no wheezes. He has no rales. He exhibits no tenderness.  Abdominal: Soft. Bowel sounds are normal. He exhibits no distension and no mass. There is no tenderness. There is no rebound and no guarding.  Musculoskeletal: Normal range of motion. He exhibits no edema and no tenderness.  Lymphadenopathy:    He has no cervical adenopathy.  Neurological: He is alert and oriented to person, place, and time. He has normal reflexes. No cranial nerve deficit. He exhibits normal muscle tone. Coordination normal.  Skin: Skin is warm and dry. No rash noted. He is not diaphoretic. No erythema. No pallor.  Psychiatric: He has a normal mood and affect. His behavior is normal. Judgment and thought content normal.   Lab Results  Component Value Date   WBC 8.5 03/22/2013   HGB 15.1 03/22/2013   HCT 45.1 03/22/2013   PLT 157.0 03/22/2013   CHOL 131 03/22/2013   TRIG 153.0* 03/22/2013   HDL 36.70* 03/22/2013   LDLDIRECT 56.3 11/20/2012   ALT 24 11/20/2012   AST 28 11/20/2012  NA 138 07/20/2013   K 3.9 07/20/2013   CL 102 07/20/2013   CREATININE 0.9 07/20/2013   BUN 13 07/20/2013   CO2 31 07/20/2013   TSH 1.92 11/15/2011   PSA 0.56 11/20/2012   HGBA1C 6.9* 07/20/2013            Assessment & Plan:

## 2013-11-26 NOTE — Progress Notes (Signed)
Pre visit review using our clinic review tool, if applicable. No additional management support is needed unless otherwise documented below in the visit note. 

## 2013-11-29 ENCOUNTER — Encounter: Payer: Self-pay | Admitting: *Deleted

## 2014-01-06 ENCOUNTER — Other Ambulatory Visit: Payer: Self-pay | Admitting: Internal Medicine

## 2014-02-14 ENCOUNTER — Other Ambulatory Visit: Payer: Self-pay | Admitting: Internal Medicine

## 2014-02-19 ENCOUNTER — Telehealth: Payer: Self-pay | Admitting: *Deleted

## 2014-02-19 NOTE — Telephone Encounter (Signed)
Left msg on triage stating medication Orseni 20/30 mg is on back order. They do have 25/15 mg is it ok to switch or does md want to rx something else...Johny Chess

## 2014-02-19 NOTE — Telephone Encounter (Signed)
Notified [facility] spoke with Sam pharmacist gave md response...Johny Chess

## 2014-02-19 NOTE — Telephone Encounter (Signed)
Gary Fowler 25-15 for now [employer]

## 2014-02-21 ENCOUNTER — Encounter: Payer: Self-pay | Admitting: Family Medicine

## 2014-02-21 ENCOUNTER — Ambulatory Visit (INDEPENDENT_AMBULATORY_CARE_PROVIDER_SITE_OTHER): Payer: BC Managed Care – PPO | Admitting: Family Medicine

## 2014-02-21 VITALS — BP 116/80 | HR 67 | Temp 98.3°F | Ht 69.0 in | Wt 258.5 lb

## 2014-02-21 DIAGNOSIS — J310 Chronic rhinitis: Secondary | ICD-10-CM

## 2014-02-21 DIAGNOSIS — J329 Chronic sinusitis, unspecified: Secondary | ICD-10-CM

## 2014-02-21 MED ORDER — BENZONATATE 100 MG PO CAPS: 100.0000 mg | ORAL_CAPSULE | Freq: Two times a day (BID) | ORAL | Status: DC | PRN

## 2014-02-21 NOTE — Progress Notes (Signed)
No chief complaint on file.   HPI:  -started: 4 days ago -symptoms:nasal congestion, sore throat, cough -denies:fever, SOB, NVD, tooth pain -has tried: nothing -sick contacts/travel/risks: denies flu exposure, tick exposure or or Ebola risks -Hx of: allergies  ROS: See pertinent positives and negatives per HPI.  Past Medical History  Diagnosis Date  . Insomnia   . Family history of prostate cancer   . Mood swings   . OSA (obstructive sleep apnea)   . Type II or unspecified type diabetes mellitus without mention of complication, not stated as uncontrolled   . Hyperlipidemia   . ED (erectile dysfunction)   . LBP (low back pain)   . Skin cancer of nose 2008    Left  . Hypertension     Past Surgical History  Procedure Laterality Date  . Lumbar laminectomy    . Lumbar fusion    . Arm fracture      with plate  . Arthroscopy knee w/ drilling      rt. knee  . Skin cancer excision      face  . Bone spur removal      lt. shoulder    Family History  Problem Relation Age of Onset  . Cancer Father     Melanoma  . Hypertension Mother   . Diabetes Maternal Grandfather   . Diabetes Paternal Grandfather   . Crohn's disease Paternal Uncle     History   Social History  . Marital Status: Married    Spouse Name: N/A    Number of Children: N/A  . Years of Education: N/A   Social History Main Topics  . Smoking status: Former Smoker    Quit date: 09/10/1989  . Smokeless tobacco: Current User    Types: Chew  . Alcohol Use: No  . Drug Use: No  . Sexual Activity: Yes   Other Topics Concern  . None   Social History Narrative   Family history of prostate CA 1st degree relative <50      Regular exercise - NO    Current outpatient prescriptions:aspirin 81 MG chewable tablet, Chew 81 mg by mouth daily.  , Disp: , Rfl: ;  atorvastatin (LIPITOR) 20 MG tablet, TAKE ONE TABLET BY MOUTH ONCE DAILY FOR CHOLESTEROL, Disp: 30 tablet, Rfl: 5;  bisoprolol-hydrochlorothiazide  (ZIAC) 10-6.25 MG per tablet, TAKE ONE TABLET BY MOUTH ONCE DAILY, Disp: 90 tablet, Rfl: 3;  glimepiride (AMARYL) 2 MG tablet, TAKE ONE TABLET BY MOUTH TWICE DAILY, Disp: 180 tablet, Rfl: 2 metFORMIN (GLUCOPHAGE) 1000 MG tablet, TAKE ONE TABLET BY MOUTH TWICE DAILY WITH A MEAL, Disp: 180 tablet, Rfl: 3;  OSENI 25-30 MG TABS, TAKE ONE TABLET BY MOUTH ONCE DAILY, Disp: 90 tablet, Rfl: 2;  tadalafil (CIALIS) 20 MG tablet, Take 20 mg by mouth as directed. Every 3 days as needed , Disp: , Rfl:   EXAM:  Filed Vitals:   02/21/14 1550  BP: 116/80  Pulse: 67  Temp: 98.3 F (36.8 C)    Body mass index is 38.16 kg/(m^2).  GENERAL: vitals reviewed and listed above, alert, oriented, appears well hydrated and in no acute distress  HEENT: atraumatic, conjunttiva clear, no obvious abnormalities on inspection of external nose and ears, normal appearance of ear canals and TMs, clear nasal congestion, mild post oropharyngeal erythema with PND, no tonsillar edema or exudate, no sinus TTP  NECK: no obvious masses on inspection  LUNGS: clear to auscultation bilaterally, no wheezes, rales or rhonchi, good air movement  CV: HRRR, no peripheral edema  MS: moves all extremities without noticeable abnormality  PSYCH: pleasant and cooperative, no obvious depression or anxiety  ASSESSMENT AND PLAN:  Discussed the following assessment and plan:  Rhinosinusitis  -given HPI and exam findings today, a serious infection or illness is unlikely. We discussed potential etiologies, with VURI or allergic rhinitis being most likely, and advised supportive care and monitoring. We discussed treatment side effects, likely course, antibiotic misuse, transmission, and signs of developing a serious illness. -of course, we advised to return or notify a doctor immediately if symptoms worsen or persist or new concerns arise.    Patient Instructions  -afrin for 4 days per instructions if needed  -nasocort for 3  weeks  -cough medication if needed per instructions  -follow up as needed     Octa Uplinger R.

## 2014-02-21 NOTE — Patient Instructions (Signed)
-  afrin for 4 days per instructions if needed  -nasocort for 3 weeks  -cough medication if needed per instructions  -follow up as needed

## 2014-02-21 NOTE — Progress Notes (Signed)
Pre visit review using our clinic review tool, if applicable. No additional management support is needed unless otherwise documented below in the visit note. 

## 2014-03-25 ENCOUNTER — Other Ambulatory Visit (INDEPENDENT_AMBULATORY_CARE_PROVIDER_SITE_OTHER): Payer: BC Managed Care – PPO

## 2014-03-25 DIAGNOSIS — E119 Type 2 diabetes mellitus without complications: Secondary | ICD-10-CM

## 2014-03-25 LAB — BASIC METABOLIC PANEL
BUN: 10 mg/dL (ref 6–23)
CHLORIDE: 105 meq/L (ref 96–112)
CO2: 28 mEq/L (ref 19–32)
Calcium: 8.7 mg/dL (ref 8.4–10.5)
Creatinine, Ser: 0.9 mg/dL (ref 0.4–1.5)
GFR: 90.8 mL/min (ref >60.00)
Glucose, Bld: 164 mg/dL — ABNORMAL HIGH (ref 70–99)
POTASSIUM: 3.4 meq/L — AB (ref 3.5–5.1)
SODIUM: 140 meq/L (ref 135–145)

## 2014-03-25 LAB — HEMOGLOBIN A1C: Hgb A1c MFr Bld: 7.1 % — ABNORMAL HIGH (ref 4.6–6.5)

## 2014-03-29 ENCOUNTER — Ambulatory Visit (INDEPENDENT_AMBULATORY_CARE_PROVIDER_SITE_OTHER): Payer: BC Managed Care – PPO | Admitting: Internal Medicine

## 2014-03-29 ENCOUNTER — Encounter: Payer: Self-pay | Admitting: Internal Medicine

## 2014-03-29 VITALS — BP 124/70 | HR 80 | Temp 97.9°F | Resp 16 | Wt 262.0 lb

## 2014-03-29 DIAGNOSIS — I10 Essential (primary) hypertension: Secondary | ICD-10-CM

## 2014-03-29 DIAGNOSIS — E119 Type 2 diabetes mellitus without complications: Secondary | ICD-10-CM

## 2014-03-29 MED ORDER — ATORVASTATIN CALCIUM 20 MG PO TABS: 20.0000 mg | ORAL_TABLET | Freq: Every day | ORAL | Status: DC

## 2014-03-29 MED ORDER — BISOPROLOL-HYDROCHLOROTHIAZIDE 10-6.25 MG PO TABS: 1.0000 | ORAL_TABLET | Freq: Every day | ORAL | Status: DC

## 2014-03-29 NOTE — Assessment & Plan Note (Signed)
Continue with current prescription therapy as reflected on the Med list.  

## 2014-03-29 NOTE — Patient Instructions (Signed)
Take over-the-counter potassium x 1-2 months

## 2014-03-29 NOTE — Progress Notes (Deleted)
Pre visit review using our clinic review tool, if applicable. No additional management support is needed unless otherwise documented below in the visit note. 

## 2014-03-29 NOTE — Progress Notes (Signed)
Subjective:    HPI   The patient needs to address his chronic hypertension that has been well controlled with medicines; to address chronic  hyperlipidemia controlled with medicines as well; and to address type 2 chronic diabetes, controlled better with medical treatment and diet.  Wt Readings from Last 3 Encounters:  03/29/14 262 lb (118.842 kg)  02/21/14 258 lb 8 oz (117.255 kg)  11/26/13 260 lb (117.935 kg)   BP Readings from Last 3 Encounters:  03/29/14 124/70  02/21/14 116/80  11/26/13 120/88      Review of Systems  Constitutional: Negative for diaphoresis, appetite change, fatigue and unexpected weight change.  HENT: Negative for congestion, hearing loss, nosebleeds, sneezing, sore throat and trouble swallowing.   Eyes: Negative for itching and visual disturbance.  Respiratory: Negative for cough, chest tightness and stridor.   Cardiovascular: Negative for chest pain, palpitations and leg swelling.  Gastrointestinal: Negative for nausea, diarrhea, blood in stool and abdominal distention.  Genitourinary: Negative for dysuria, frequency, hematuria and scrotal swelling.  Musculoskeletal: Negative for back pain, gait problem, joint swelling and neck pain.  Skin: Negative for pallor, rash and wound.  Neurological: Negative for dizziness, tremors, speech difficulty, weakness and headaches.  Psychiatric/Behavioral: Negative for suicidal ideas, behavioral problems, confusion, sleep disturbance, dysphoric mood and agitation. The patient is not nervous/anxious.        Objective:   Physical Exam  Constitutional: He is oriented to person, place, and time. He appears well-developed and well-nourished. No distress.  Lost 2 lbs  HENT:  Head: Normocephalic and atraumatic.  Right Ear: External ear normal.  Left Ear: External ear normal.  Nose: Nose normal.  Mouth/Throat: Oropharynx is clear and moist. No oropharyngeal exudate.  Eyes: Conjunctivae and EOM are normal. Pupils are  equal, round, and reactive to light. Right eye exhibits no discharge. Left eye exhibits no discharge. No scleral icterus.  Neck: Normal range of motion. Neck supple. No JVD present. No tracheal deviation present. No thyromegaly present.  Cardiovascular: Normal rate, regular rhythm, normal heart sounds and intact distal pulses.  Exam reveals no gallop and no friction rub.   No murmur heard. Pulmonary/Chest: Effort normal and breath sounds normal. No stridor. No respiratory distress. He has no wheezes. He has no rales. He exhibits no tenderness.  Abdominal: Soft. Bowel sounds are normal. He exhibits no distension and no mass. There is no tenderness. There is no rebound and no guarding.  Musculoskeletal: Normal range of motion. He exhibits no edema and no tenderness.  Lymphadenopathy:    He has no cervical adenopathy.  Neurological: He is alert and oriented to person, place, and time. He has normal reflexes. No cranial nerve deficit. He exhibits normal muscle tone. Coordination normal.  Skin: Skin is warm and dry. No rash noted. He is not diaphoretic. No erythema. No pallor.  Psychiatric: He has a normal mood and affect. His behavior is normal. Judgment and thought content normal.   Lab Results  Component Value Date   WBC 7.9 11/26/2013   HGB 14.4 11/26/2013   HCT 42.3 11/26/2013   PLT 146.0* 11/26/2013   CHOL 135 11/26/2013   TRIG 168.0* 11/26/2013   HDL 36.30* 11/26/2013   LDLDIRECT 56.3 11/20/2012   ALT 19 11/26/2013   AST 28 11/26/2013   NA 140 03/25/2014   K 3.4* 03/25/2014   CL 105 03/25/2014   CREATININE 0.9 03/25/2014   BUN 10 03/25/2014   CO2 28 03/25/2014   TSH 1.15 11/26/2013   PSA 0.63  11/26/2013   HGBA1C 7.1* 03/25/2014          Assessment & Plan:

## 2014-07-25 ENCOUNTER — Other Ambulatory Visit (INDEPENDENT_AMBULATORY_CARE_PROVIDER_SITE_OTHER): Payer: BC Managed Care – PPO

## 2014-07-25 DIAGNOSIS — E119 Type 2 diabetes mellitus without complications: Secondary | ICD-10-CM

## 2014-07-25 LAB — BASIC METABOLIC PANEL
BUN: 15 mg/dL (ref 6–23)
CALCIUM: 8.6 mg/dL (ref 8.4–10.5)
CO2: 25 mEq/L (ref 19–32)
Chloride: 104 mEq/L (ref 96–112)
Creatinine, Ser: 0.9 mg/dL (ref 0.4–1.5)
GFR: 94.23 mL/min (ref >60.00)
GLUCOSE: 176 mg/dL — AB (ref 70–99)
Potassium: 3.6 mEq/L (ref 3.5–5.1)
Sodium: 137 mEq/L (ref 135–145)

## 2014-07-25 LAB — HEMOGLOBIN A1C: Hgb A1c MFr Bld: 7.8 % — ABNORMAL HIGH (ref 4.6–6.5)

## 2014-07-30 ENCOUNTER — Ambulatory Visit (INDEPENDENT_AMBULATORY_CARE_PROVIDER_SITE_OTHER): Payer: BC Managed Care – PPO | Admitting: Internal Medicine

## 2014-07-30 ENCOUNTER — Encounter: Payer: Self-pay | Admitting: Internal Medicine

## 2014-07-30 VITALS — BP 138/80 | HR 62 | Temp 98.4°F | Wt 262.0 lb

## 2014-07-30 DIAGNOSIS — IMO0002 Reserved for concepts with insufficient information to code with codable children: Secondary | ICD-10-CM

## 2014-07-30 DIAGNOSIS — Z0000 Encounter for general adult medical examination without abnormal findings: Secondary | ICD-10-CM

## 2014-07-30 DIAGNOSIS — I10 Essential (primary) hypertension: Secondary | ICD-10-CM

## 2014-07-30 DIAGNOSIS — L570 Actinic keratosis: Secondary | ICD-10-CM

## 2014-07-30 DIAGNOSIS — E1165 Type 2 diabetes mellitus with hyperglycemia: Secondary | ICD-10-CM

## 2014-07-30 NOTE — Assessment & Plan Note (Signed)
Continue with current prescription therapy as reflected on the Med list.  

## 2014-07-30 NOTE — Progress Notes (Signed)
Pre visit review using our clinic review tool, if applicable. No additional management support is needed unless otherwise documented below in the visit note. 

## 2014-07-30 NOTE — Patient Instructions (Signed)
   Postprocedure instructions :     Keep the wounds clean. You can wash them with liquid soap and water. Pat dry with gauze or a Kleenex tissue  Before applying antibiotic ointment and a Band-Aid.   You need to report immediately  if  any signs of infection develop.    

## 2014-07-30 NOTE — Assessment & Plan Note (Signed)
See Cryo x3

## 2014-07-30 NOTE — Assessment & Plan Note (Addendum)
Worse Discussed the need for additional therapies Will try low carb diet - loose wt RTC 3 mo

## 2014-07-30 NOTE — Progress Notes (Signed)
Subjective:    HPI   The patient needs to address his chronic hypertension that has been well controlled with medicines; to address chronic  hyperlipidemia controlled with medicines as well; and to address type 2 chronic diabetes, controlled better with medical treatment and diet.  Wt Readings from Last 3 Encounters:  07/30/14 262 lb (118.842 kg)  03/29/14 262 lb (118.842 kg)  02/21/14 258 lb 8 oz (117.255 kg)   BP Readings from Last 3 Encounters:  07/30/14 138/80  03/29/14 124/70  02/21/14 116/80      Review of Systems  Constitutional: Negative for diaphoresis, appetite change, fatigue and unexpected weight change.  HENT: Negative for congestion, hearing loss, nosebleeds, sneezing, sore throat and trouble swallowing.   Eyes: Negative for itching and visual disturbance.  Respiratory: Negative for cough, chest tightness and stridor.   Cardiovascular: Negative for chest pain, palpitations and leg swelling.  Gastrointestinal: Negative for nausea, diarrhea, blood in stool and abdominal distention.  Genitourinary: Negative for dysuria, frequency, hematuria and scrotal swelling.  Musculoskeletal: Negative for back pain, joint swelling, gait problem and neck pain.  Skin: Negative for pallor, rash and wound.  Neurological: Negative for dizziness, tremors, speech difficulty, weakness and headaches.  Psychiatric/Behavioral: Negative for suicidal ideas, behavioral problems, confusion, sleep disturbance, dysphoric mood and agitation. The patient is not nervous/anxious.        Objective:   Physical Exam  Constitutional: He is oriented to person, place, and time. He appears well-developed. No distress.  NAD  HENT:  Mouth/Throat: Oropharynx is clear and moist.  Eyes: Conjunctivae are normal. Pupils are equal, round, and reactive to light.  Neck: Normal range of motion. No JVD present. No thyromegaly present.  Cardiovascular: Normal rate, regular rhythm, normal heart sounds and intact  distal pulses.  Exam reveals no gallop and no friction rub.   No murmur heard. Pulmonary/Chest: Effort normal and breath sounds normal. No respiratory distress. He has no wheezes. He has no rales. He exhibits no tenderness.  Abdominal: Soft. Bowel sounds are normal. He exhibits no distension and no mass. There is no tenderness. There is no rebound and no guarding.  Musculoskeletal: Normal range of motion. He exhibits no edema or tenderness.  Lymphadenopathy:    He has no cervical adenopathy.  Neurological: He is alert and oriented to person, place, and time. He has normal reflexes. No cranial nerve deficit. He exhibits normal muscle tone. He displays a negative Romberg sign. Coordination and gait normal.  No meningeal signs  Skin: Skin is warm and dry. No rash noted.  Psychiatric: He has a normal mood and affect. His behavior is normal. Judgment and thought content normal.  AK x3 Lab Results  Component Value Date   WBC 7.9 11/26/2013   HGB 14.4 11/26/2013   HCT 42.3 11/26/2013   PLT 146.0* 11/26/2013   CHOL 135 11/26/2013   TRIG 168.0* 11/26/2013   HDL 36.30* 11/26/2013   LDLDIRECT 56.3 11/20/2012   ALT 19 11/26/2013   AST 28 11/26/2013   NA 137 07/25/2014   K 3.6 07/25/2014   CL 104 07/25/2014   CREATININE 0.9 07/25/2014   BUN 15 07/25/2014   CO2 25 07/25/2014   TSH 1.15 11/26/2013   PSA 0.63 11/26/2013   HGBA1C 7.8* 07/25/2014    Procedure Note :     Procedure : Cryosurgery   Indication:   Actinic keratosis(es)   Risks including unsuccessful procedure , bleeding, infection, bruising, scar, a need for a repeat  procedure and others were  explained to the patient in detail as well as the benefits. Informed consent was obtained verbally.    3 lesion(s)  on face, neck  was/were treated with liquid nitrogen on a Q-tip in a usual fasion . Band-Aid was applied and antibiotic ointment was given for a later use.   Tolerated well. Complications none.   Postprocedure instructions  :     Keep the wounds clean. You can wash them with liquid soap and water. Pat dry with gauze or a Kleenex tissue  Before applying antibiotic ointment and a Band-Aid.   You need to report immediately  if  any signs of infection develop.          Assessment & Plan:

## 2014-07-31 ENCOUNTER — Telehealth: Payer: Self-pay | Admitting: Internal Medicine

## 2014-07-31 NOTE — Telephone Encounter (Signed)
emmi mailed  °

## 2014-11-06 ENCOUNTER — Other Ambulatory Visit: Payer: Self-pay | Admitting: Dermatology

## 2014-11-21 ENCOUNTER — Other Ambulatory Visit (INDEPENDENT_AMBULATORY_CARE_PROVIDER_SITE_OTHER): Payer: Self-pay

## 2014-11-21 DIAGNOSIS — E1165 Type 2 diabetes mellitus with hyperglycemia: Secondary | ICD-10-CM

## 2014-11-21 DIAGNOSIS — IMO0002 Reserved for concepts with insufficient information to code with codable children: Secondary | ICD-10-CM

## 2014-11-21 DIAGNOSIS — Z0000 Encounter for general adult medical examination without abnormal findings: Secondary | ICD-10-CM

## 2014-11-21 LAB — LIPID PANEL
CHOL/HDL RATIO: 4
Cholesterol: 127 mg/dL (ref 0–200)
HDL: 33.7 mg/dL — ABNORMAL LOW (ref >39.00)
LDL Cholesterol: 70 mg/dL (ref 0–99)
NonHDL: 93.3
Triglycerides: 115 mg/dL (ref 0.0–149.0)
VLDL: 23 mg/dL (ref 0.0–40.0)

## 2014-11-21 LAB — CBC WITH DIFFERENTIAL/PLATELET
BASOS ABS: 0 10*3/uL (ref 0.0–0.1)
Basophils Relative: 0.3 % (ref 0.0–3.0)
EOS ABS: 0.2 10*3/uL (ref 0.0–0.7)
Eosinophils Relative: 2.1 % (ref 0.0–5.0)
HEMATOCRIT: 45 % (ref 39.0–52.0)
HEMOGLOBIN: 15.2 g/dL (ref 13.0–17.0)
LYMPHS ABS: 2.5 10*3/uL (ref 0.7–4.0)
Lymphocytes Relative: 29.6 % (ref 12.0–46.0)
MCHC: 33.8 g/dL (ref 30.0–36.0)
MCV: 84.1 fl (ref 78.0–100.0)
MONO ABS: 0.4 10*3/uL (ref 0.1–1.0)
Monocytes Relative: 4.5 % (ref 3.0–12.0)
NEUTROS ABS: 5.3 10*3/uL (ref 1.4–7.7)
Neutrophils Relative %: 63.5 % (ref 43.0–77.0)
Platelets: 169 10*3/uL (ref 150.0–400.0)
RBC: 5.35 Mil/uL (ref 4.22–5.81)
RDW: 13.5 % (ref 11.5–15.5)
WBC: 8.4 10*3/uL (ref 4.0–10.5)

## 2014-11-21 LAB — BASIC METABOLIC PANEL
BUN: 15 mg/dL (ref 6–23)
CALCIUM: 9.1 mg/dL (ref 8.4–10.5)
CHLORIDE: 105 meq/L (ref 96–112)
CO2: 28 meq/L (ref 19–32)
CREATININE: 0.91 mg/dL (ref 0.40–1.50)
GFR: 91.73 mL/min (ref >60.00)
GLUCOSE: 171 mg/dL — AB (ref 70–99)
Potassium: 4.5 mEq/L (ref 3.5–5.1)
Sodium: 139 mEq/L (ref 135–145)

## 2014-11-21 LAB — URINALYSIS
BILIRUBIN URINE: NEGATIVE
Hgb urine dipstick: NEGATIVE
KETONES UR: NEGATIVE
LEUKOCYTES UA: NEGATIVE
Nitrite: NEGATIVE
PH: 6 (ref 5.0–8.0)
Total Protein, Urine: NEGATIVE
Urine Glucose: NEGATIVE
Urobilinogen, UA: 0.2 (ref 0.0–1.0)

## 2014-11-21 LAB — HEPATIC FUNCTION PANEL
ALT: 16 U/L (ref 0–53)
AST: 21 U/L (ref 0–37)
Albumin: 4.2 g/dL (ref 3.5–5.2)
Alkaline Phosphatase: 70 U/L (ref 39–117)
BILIRUBIN DIRECT: 0.2 mg/dL (ref 0.0–0.3)
BILIRUBIN TOTAL: 0.7 mg/dL (ref 0.2–1.2)
TOTAL PROTEIN: 6.7 g/dL (ref 6.0–8.3)

## 2014-11-21 LAB — HEMOGLOBIN A1C: HEMOGLOBIN A1C: 6.6 % — AB (ref 4.6–6.5)

## 2014-11-21 LAB — TSH: TSH: 1.15 u[IU]/mL (ref 0.35–4.50)

## 2014-11-21 LAB — PSA: PSA: 0.71 ng/mL (ref 0.10–4.00)

## 2014-11-23 ENCOUNTER — Other Ambulatory Visit: Payer: Self-pay | Admitting: Internal Medicine

## 2014-11-28 ENCOUNTER — Ambulatory Visit (INDEPENDENT_AMBULATORY_CARE_PROVIDER_SITE_OTHER): Payer: BLUE CROSS/BLUE SHIELD | Admitting: Internal Medicine

## 2014-11-28 ENCOUNTER — Encounter: Payer: Self-pay | Admitting: Internal Medicine

## 2014-11-28 VITALS — BP 138/80 | HR 58 | Ht 69.0 in | Wt 251.0 lb

## 2014-11-28 DIAGNOSIS — E1165 Type 2 diabetes mellitus with hyperglycemia: Secondary | ICD-10-CM | POA: Diagnosis not present

## 2014-11-28 DIAGNOSIS — Z0000 Encounter for general adult medical examination without abnormal findings: Secondary | ICD-10-CM | POA: Diagnosis not present

## 2014-11-28 DIAGNOSIS — E669 Obesity, unspecified: Secondary | ICD-10-CM

## 2014-11-28 DIAGNOSIS — L570 Actinic keratosis: Secondary | ICD-10-CM

## 2014-11-28 DIAGNOSIS — IMO0002 Reserved for concepts with insufficient information to code with codable children: Secondary | ICD-10-CM

## 2014-11-28 NOTE — Assessment & Plan Note (Signed)
Lab Results  Component Value Date   HGBA1C 6.6* 11/21/2014  Amaryl, metformin

## 2014-11-28 NOTE — Assessment & Plan Note (Signed)
We discussed age appropriate health related issues, including available/recomended screening tests and vaccinations. We discussed a need for adhering to healthy diet and exercise. Labs/EKG were reviewed/ordered. All questions were answered. Labs EKG Cont w/wt loss

## 2014-11-28 NOTE — Assessment & Plan Note (Signed)
S/p Moh's surgery

## 2014-11-28 NOTE — Assessment & Plan Note (Signed)
Wt Readings from Last 3 Encounters:  11/28/14 251 lb (113.853 kg)  07/30/14 262 lb (118.842 kg)  03/29/14 262 lb (118.842 kg)

## 2014-11-28 NOTE — Progress Notes (Signed)
   Subjective:    HPI  The patient is here for a wellness exam.  The patient needs to address his chronic hypertension that has been well controlled with medicines; to address chronic  hyperlipidemia controlled with medicines as well; and to address type 2 chronic diabetes, controlled better with medical treatment and diet.  Wt Readings from Last 3 Encounters:  11/28/14 251 lb (113.853 kg)  07/30/14 262 lb (118.842 kg)  03/29/14 262 lb (118.842 kg)   BP Readings from Last 3 Encounters:  11/28/14 138/80  07/30/14 138/80  03/29/14 124/70      Review of Systems  Constitutional: Negative for diaphoresis, appetite change, fatigue and unexpected weight change.  HENT: Negative for congestion, hearing loss, nosebleeds, sneezing, sore throat and trouble swallowing.   Eyes: Negative for itching and visual disturbance.  Respiratory: Negative for cough, chest tightness and stridor.   Cardiovascular: Negative for chest pain, palpitations and leg swelling.  Gastrointestinal: Negative for nausea, diarrhea, blood in stool and abdominal distention.  Genitourinary: Negative for dysuria, frequency, hematuria and scrotal swelling.  Musculoskeletal: Negative for back pain, joint swelling, gait problem and neck pain.  Skin: Negative for pallor, rash and wound.  Neurological: Negative for dizziness, tremors, speech difficulty, weakness and headaches.  Psychiatric/Behavioral: Negative for suicidal ideas, behavioral problems, confusion, sleep disturbance, dysphoric mood and agitation. The patient is not nervous/anxious.        Objective:   Physical Exam  Constitutional: He is oriented to person, place, and time. He appears well-developed. No distress.  NAD  HENT:  Mouth/Throat: Oropharynx is clear and moist.  Eyes: Conjunctivae are normal. Pupils are equal, round, and reactive to light.  Neck: Normal range of motion. No JVD present. No thyromegaly present.  Cardiovascular: Normal rate, regular  rhythm, normal heart sounds and intact distal pulses.  Exam reveals no gallop and no friction rub.   No murmur heard. Pulmonary/Chest: Effort normal and breath sounds normal. No respiratory distress. He has no wheezes. He has no rales. He exhibits no tenderness.  Abdominal: Soft. Bowel sounds are normal. He exhibits no distension and no mass. There is no tenderness. There is no rebound and no guarding.  Musculoskeletal: Normal range of motion. He exhibits no edema or tenderness.  Lymphadenopathy:    He has no cervical adenopathy.  Neurological: He is alert and oriented to person, place, and time. He has normal reflexes. No cranial nerve deficit. He exhibits normal muscle tone. He displays a negative Romberg sign. Coordination and gait normal.  No meningeal signs  Skin: Skin is warm and dry. No rash noted.  Psychiatric: He has a normal mood and affect. His behavior is normal. Judgment and thought content normal.  s/p Moh's surgery - nose is dressed    Lab Results  Component Value Date   WBC 8.4 11/21/2014   HGB 15.2 11/21/2014   HCT 45.0 11/21/2014   PLT 169.0 11/21/2014   CHOL 127 11/21/2014   TRIG 115.0 11/21/2014   HDL 33.70* 11/21/2014   LDLDIRECT 56.3 11/20/2012   ALT 16 11/21/2014   AST 21 11/21/2014   NA 139 11/21/2014   K 4.5 11/21/2014   CL 105 11/21/2014   CREATININE 0.91 11/21/2014   BUN 15 11/21/2014   CO2 28 11/21/2014   TSH 1.15 11/21/2014   PSA 0.71 11/21/2014   HGBA1C 6.6* 11/21/2014      EKG    Assessment & Plan:

## 2014-11-28 NOTE — Progress Notes (Signed)
Pre visit review using our clinic review tool, if applicable. No additional management support is needed unless otherwise documented below in the visit note. 

## 2014-12-06 ENCOUNTER — Other Ambulatory Visit: Payer: Self-pay | Admitting: Internal Medicine

## 2014-12-09 ENCOUNTER — Other Ambulatory Visit: Payer: Self-pay

## 2014-12-09 MED ORDER — ALOGLIPTIN-PIOGLITAZONE 25-30 MG PO TABS
1.0000 | ORAL_TABLET | Freq: Every day | ORAL | Status: DC
Start: 2014-12-09 — End: 2016-01-21

## 2015-02-21 ENCOUNTER — Other Ambulatory Visit: Payer: Self-pay | Admitting: Internal Medicine

## 2015-03-27 ENCOUNTER — Other Ambulatory Visit (INDEPENDENT_AMBULATORY_CARE_PROVIDER_SITE_OTHER): Payer: BLUE CROSS/BLUE SHIELD

## 2015-03-27 DIAGNOSIS — E119 Type 2 diabetes mellitus without complications: Secondary | ICD-10-CM | POA: Diagnosis not present

## 2015-03-27 LAB — HEMOGLOBIN A1C: Hgb A1c MFr Bld: 6.3 % (ref 4.6–6.5)

## 2015-03-27 LAB — BASIC METABOLIC PANEL
BUN: 16 mg/dL (ref 6–23)
CALCIUM: 9.3 mg/dL (ref 8.4–10.5)
CO2: 27 mEq/L (ref 19–32)
Chloride: 105 mEq/L (ref 96–112)
Creatinine, Ser: 0.93 mg/dL (ref 0.40–1.50)
GFR: 89.35 mL/min (ref >60.00)
Glucose, Bld: 159 mg/dL — ABNORMAL HIGH (ref 70–99)
Potassium: 4.3 mEq/L (ref 3.5–5.1)
Sodium: 141 mEq/L (ref 135–145)

## 2015-03-31 ENCOUNTER — Encounter: Payer: Self-pay | Admitting: Internal Medicine

## 2015-03-31 ENCOUNTER — Ambulatory Visit (INDEPENDENT_AMBULATORY_CARE_PROVIDER_SITE_OTHER): Payer: BLUE CROSS/BLUE SHIELD | Admitting: Internal Medicine

## 2015-03-31 VITALS — BP 128/80 | HR 54 | Wt 249.0 lb

## 2015-03-31 DIAGNOSIS — M5440 Lumbago with sciatica, unspecified side: Secondary | ICD-10-CM

## 2015-03-31 DIAGNOSIS — IMO0002 Reserved for concepts with insufficient information to code with codable children: Secondary | ICD-10-CM

## 2015-03-31 DIAGNOSIS — E1165 Type 2 diabetes mellitus with hyperglycemia: Secondary | ICD-10-CM

## 2015-03-31 DIAGNOSIS — I10 Essential (primary) hypertension: Secondary | ICD-10-CM | POA: Diagnosis not present

## 2015-03-31 NOTE — Progress Notes (Signed)
Pre visit review using our clinic review tool, if applicable. No additional management support is needed unless otherwise documented below in the visit note. 

## 2015-03-31 NOTE — Assessment & Plan Note (Signed)
Doing well 

## 2015-03-31 NOTE — Assessment & Plan Note (Signed)
Amaryl, metformin, Alloglipt- Pioglitazone Better

## 2015-03-31 NOTE — Progress Notes (Signed)
   Subjective:    HPI  The patient needs to address his chronic hypertension that has been well controlled with medicines; to address chronic  hyperlipidemia controlled with medicines as well; and to address type 2 chronic diabetes, controlled better with medical treatment and diet. He had skin ca surgery on face     Wt Readings from Last 3 Encounters:  03/31/15 249 lb (112.946 kg)  11/28/14 251 lb (113.853 kg)  07/30/14 262 lb (118.842 kg)   BP Readings from Last 3 Encounters:  03/31/15 128/80  11/28/14 138/80  07/30/14 138/80      Review of Systems  Constitutional: Negative for diaphoresis, appetite change, fatigue and unexpected weight change.  HENT: Negative for congestion, hearing loss, nosebleeds, sneezing, sore throat and trouble swallowing.   Eyes: Negative for itching and visual disturbance.  Respiratory: Negative for cough, chest tightness and stridor.   Cardiovascular: Negative for chest pain, palpitations and leg swelling.  Gastrointestinal: Negative for nausea, diarrhea, blood in stool and abdominal distention.  Genitourinary: Negative for dysuria, frequency, hematuria and scrotal swelling.  Musculoskeletal: Negative for back pain, joint swelling, gait problem and neck pain.  Skin: Negative for pallor, rash and wound.  Neurological: Negative for dizziness, tremors, speech difficulty, weakness and headaches.  Psychiatric/Behavioral: Negative for suicidal ideas, behavioral problems, confusion, sleep disturbance, dysphoric mood and agitation. The patient is not nervous/anxious.        Objective:   Physical Exam  Constitutional: He is oriented to person, place, and time. He appears well-developed. No distress.  NAD  HENT:  Mouth/Throat: Oropharynx is clear and moist.  Eyes: Conjunctivae are normal. Pupils are equal, round, and reactive to light.  Neck: Normal range of motion. No JVD present. No thyromegaly present.  Cardiovascular: Normal rate, regular rhythm,  normal heart sounds and intact distal pulses.  Exam reveals no gallop and no friction rub.   No murmur heard. Pulmonary/Chest: Effort normal and breath sounds normal. No respiratory distress. He has no wheezes. He has no rales. He exhibits no tenderness.  Abdominal: Soft. Bowel sounds are normal. He exhibits no distension and no mass. There is no tenderness. There is no rebound and no guarding.  Musculoskeletal: Normal range of motion. He exhibits no edema or tenderness.  Lymphadenopathy:    He has no cervical adenopathy.  Neurological: He is alert and oriented to person, place, and time. He has normal reflexes. No cranial nerve deficit. He exhibits normal muscle tone. He displays a negative Romberg sign. Coordination and gait normal.  No meningeal signs  Skin: Skin is warm and dry. No rash noted.  Psychiatric: He has a normal mood and affect. His behavior is normal. Judgment and thought content normal.  s/p Moh's surgery - nose is healed    Lab Results  Component Value Date   WBC 8.4 11/21/2014   HGB 15.2 11/21/2014   HCT 45.0 11/21/2014   PLT 169.0 11/21/2014   CHOL 127 11/21/2014   TRIG 115.0 11/21/2014   HDL 33.70* 11/21/2014   LDLDIRECT 56.3 11/20/2012   ALT 16 11/21/2014   AST 21 11/21/2014   NA 141 03/27/2015   K 4.3 03/27/2015   CL 105 03/27/2015   CREATININE 0.93 03/27/2015   BUN 16 03/27/2015   CO2 27 03/27/2015   TSH 1.15 11/21/2014   PSA 0.71 11/21/2014   HGBA1C 6.3 03/27/2015         Assessment & Plan:

## 2015-03-31 NOTE — Assessment & Plan Note (Signed)
On Bisoprolol HCT 

## 2015-04-24 ENCOUNTER — Other Ambulatory Visit: Payer: Self-pay | Admitting: Internal Medicine

## 2015-05-30 ENCOUNTER — Other Ambulatory Visit: Payer: Self-pay | Admitting: Internal Medicine

## 2015-06-29 ENCOUNTER — Other Ambulatory Visit: Payer: Self-pay | Admitting: Internal Medicine

## 2015-06-30 ENCOUNTER — Other Ambulatory Visit: Payer: Self-pay

## 2015-06-30 MED ORDER — ATORVASTATIN CALCIUM 20 MG PO TABS: 20.0000 mg | ORAL_TABLET | Freq: Every day | ORAL | Status: DC

## 2015-07-23 ENCOUNTER — Encounter: Payer: Self-pay | Admitting: Internal Medicine

## 2015-07-23 ENCOUNTER — Ambulatory Visit (INDEPENDENT_AMBULATORY_CARE_PROVIDER_SITE_OTHER): Payer: BLUE CROSS/BLUE SHIELD | Admitting: Internal Medicine

## 2015-07-23 ENCOUNTER — Other Ambulatory Visit (INDEPENDENT_AMBULATORY_CARE_PROVIDER_SITE_OTHER): Payer: BLUE CROSS/BLUE SHIELD

## 2015-07-23 VITALS — BP 132/86 | HR 58 | Temp 97.9°F | Resp 20 | Ht 69.0 in | Wt 260.8 lb

## 2015-07-23 DIAGNOSIS — N200 Calculus of kidney: Secondary | ICD-10-CM

## 2015-07-23 DIAGNOSIS — R310 Gross hematuria: Secondary | ICD-10-CM | POA: Diagnosis not present

## 2015-07-23 DIAGNOSIS — R319 Hematuria, unspecified: Secondary | ICD-10-CM | POA: Diagnosis not present

## 2015-07-23 LAB — CBC WITH DIFFERENTIAL/PLATELET
Basophils Absolute: 0 10*3/uL (ref 0.0–0.1)
Basophils Relative: 0.3 % (ref 0.0–3.0)
EOS PCT: 1.4 % (ref 0.0–5.0)
Eosinophils Absolute: 0.1 10*3/uL (ref 0.0–0.7)
HCT: 44.5 % (ref 39.0–52.0)
HEMOGLOBIN: 15 g/dL (ref 13.0–17.0)
Lymphocytes Relative: 29 % (ref 12.0–46.0)
Lymphs Abs: 2.6 10*3/uL (ref 0.7–4.0)
MCHC: 33.6 g/dL (ref 30.0–36.0)
MCV: 85.3 fl (ref 78.0–100.0)
MONOS PCT: 5.3 % (ref 3.0–12.0)
Monocytes Absolute: 0.5 10*3/uL (ref 0.1–1.0)
Neutro Abs: 5.8 10*3/uL (ref 1.4–7.7)
Neutrophils Relative %: 64 % (ref 43.0–77.0)
Platelets: 176 10*3/uL (ref 150.0–400.0)
RBC: 5.22 Mil/uL (ref 4.22–5.81)
RDW: 13.4 % (ref 11.5–15.5)
WBC: 9.1 10*3/uL (ref 4.0–10.5)

## 2015-07-23 LAB — POCT URINALYSIS DIPSTICK
Bilirubin, UA: NEGATIVE
Glucose, UA: NEGATIVE
Ketones, UA: NEGATIVE
LEUKOCYTES UA: NEGATIVE
NITRITE UA: NEGATIVE
Spec Grav, UA: 1.025
UROBILINOGEN UA: 0.2
pH, UA: 5.5

## 2015-07-23 LAB — BASIC METABOLIC PANEL
BUN: 11 mg/dL (ref 6–23)
CALCIUM: 9.5 mg/dL (ref 8.4–10.5)
CHLORIDE: 102 meq/L (ref 96–112)
CO2: 29 meq/L (ref 19–32)
CREATININE: 1 mg/dL (ref 0.40–1.50)
GFR: 82.08 mL/min (ref >60.00)
GLUCOSE: 94 mg/dL (ref 70–99)
Potassium: 4.1 mEq/L (ref 3.5–5.1)
SODIUM: 140 meq/L (ref 135–145)

## 2015-07-23 MED ORDER — OXYCODONE-ACETAMINOPHEN 7.5-325 MG PO TABS
1.0000 | ORAL_TABLET | ORAL | Status: DC | PRN
Start: 2015-07-23 — End: 2016-04-01

## 2015-07-23 NOTE — Progress Notes (Signed)
Subjective:  Patient ID: Gary Fowler, male    DOB: 1961/07/09  Age: 54 y.o. MRN: [id number]  CC: Hematuria  New to me  HPI LIAM ARBON presents for the complaint of bloody urine for about 2-3 days. He has a history of a kidney stone on the right side that was documented on the CT scan done 7 years ago. That stone was never treated by his report. He has had some twinges of pain in his right flank.  Outpatient Prescriptions Prior to Visit  Medication Sig Dispense Refill  . Alogliptin-Pioglitazone (OSENI) 25-30 MG TABS Take 1 tablet by mouth daily. 90 tablet 3  . aspirin 81 MG chewable tablet Chew 81 mg by mouth daily.      Marland Kitchen atorvastatin (LIPITOR) 20 MG tablet TAKE ONE TABLET BY MOUTH ONCE DAILY AT  6PM 90 tablet 0  . atorvastatin (LIPITOR) 20 MG tablet Take 1 tablet (20 mg total) by mouth daily at 6 PM. 90 tablet 3  . bisoprolol-hydrochlorothiazide (ZIAC) 10-6.25 MG per tablet TAKE ONE TABLET BY MOUTH ONCE DAILY 90 tablet 3  . glimepiride (AMARYL) 2 MG tablet TAKE ONE TABLET BY MOUTH TWICE DAILY 180 tablet 3  . ibuprofen (ADVIL,MOTRIN) 600 MG tablet TAKE ONE TABLET BY MOUTH TWICE DAILY AS NEEDED FOR PAIN 180 tablet 0  . metFORMIN (GLUCOPHAGE) 1000 MG tablet TAKE ONE TABLET BY MOUTH TWICE DAILY WITH A MEAL 180 tablet 3  . tadalafil (CIALIS) 20 MG tablet Take 20 mg by mouth as directed. Every 3 days as needed     . traMADol (ULTRAM) 50 MG tablet as needed.  0  . doxycycline (VIBRAMYCIN) 100 MG capsule 2 (two) times daily.  0   No facility-administered medications prior to visit.    ROS Review of Systems  Constitutional: Negative.  Negative for fever, chills, diaphoresis, appetite change and fatigue.  HENT: Negative.   Eyes: Negative.   Respiratory: Negative.  Negative for cough, choking, chest tightness, shortness of breath and stridor.   Cardiovascular: Negative.  Negative for chest pain, palpitations and leg swelling.  Gastrointestinal: Negative.  Negative for nausea,  vomiting, abdominal pain, diarrhea and constipation.  Endocrine: Negative.   Genitourinary: Positive for frequency, hematuria and flank pain. Negative for dysuria, urgency, decreased urine volume, discharge, penile swelling, scrotal swelling, enuresis, difficulty urinating, genital sores, penile pain and testicular pain.  Musculoskeletal: Negative.   Skin: Negative.   Allergic/Immunologic: Negative.   Neurological: Negative.   Hematological: Negative.  Negative for adenopathy. Does not bruise/bleed easily.  Psychiatric/Behavioral: Negative.     Objective:  BP 132/86 mmHg  Pulse 58  Temp(Src) 97.9 F (36.6 C) (Oral)  Resp 20  Ht 5\' 9"  (1.753 m)  Wt 260 lb 12 oz (118.275 kg)  BMI 38.49 kg/m2  SpO2 95%  BP Readings from Last 3 Encounters:  07/23/15 132/86  03/31/15 128/80  11/28/14 138/80    Wt Readings from Last 3 Encounters:  07/23/15 260 lb 12 oz (118.275 kg)  03/31/15 249 lb (112.946 kg)  11/28/14 251 lb (113.853 kg)    Physical Exam  Constitutional: He is oriented to person, place, and time. No distress.  HENT:  Head: Normocephalic and atraumatic.  Mouth/Throat: Oropharynx is clear and moist. No oropharyngeal exudate.  Eyes: Conjunctivae are normal. Right eye exhibits no discharge. Left eye exhibits no discharge. No scleral icterus.  Neck: Normal range of motion. Neck supple. No JVD present. No tracheal deviation present. No thyromegaly present.  Cardiovascular: Normal rate, regular rhythm,  normal heart sounds and intact distal pulses.  Exam reveals no gallop and no friction rub.   No murmur heard. Pulmonary/Chest: Effort normal and breath sounds normal. No stridor. No respiratory distress. He has no wheezes. He has no rales. He exhibits no tenderness.  Abdominal: Soft. Bowel sounds are normal. He exhibits no distension and no mass. There is no hepatosplenomegaly, splenomegaly or hepatomegaly. There is no tenderness. There is no rebound, no guarding and no CVA  tenderness. No hernia. Hernia confirmed negative in the ventral area, confirmed negative in the right inguinal area and confirmed negative in the left inguinal area.  Genitourinary: Testes normal and penis normal. Rectal exam shows no external hemorrhoid, no internal hemorrhoid, no fissure, no mass, no tenderness and anal tone normal. Guaiac negative stool. Prostate is enlarged (1+ smooth symm BPH). Prostate is not tender. Right testis shows no mass, no swelling and no tenderness. Right testis is descended. Left testis shows no mass, no swelling and no tenderness. Left testis is descended. Circumcised. No penile erythema or penile tenderness. No discharge found.  Musculoskeletal: Normal range of motion. He exhibits no edema or tenderness.  Lymphadenopathy:    He has no cervical adenopathy.       Right: No inguinal adenopathy present.       Left: No inguinal adenopathy present.  Neurological: He is oriented to person, place, and time.  Skin: Skin is warm and dry. No rash noted. He is not diaphoretic. No erythema. No pallor.  Vitals reviewed.   Lab Results  Component Value Date   WBC 9.1 07/23/2015   HGB 15.0 07/23/2015   HCT 44.5 07/23/2015   PLT 176.0 07/23/2015   GLUCOSE 94 07/23/2015   CHOL 127 11/21/2014   TRIG 115.0 11/21/2014   HDL 33.70* 11/21/2014   LDLDIRECT 56.3 11/20/2012   LDLCALC 70 11/21/2014   ALT 16 11/21/2014   AST 21 11/21/2014   NA 140 07/23/2015   K 4.1 07/23/2015   CL 102 07/23/2015   CREATININE 1.00 07/23/2015   BUN 11 07/23/2015   CO2 29 07/23/2015   TSH 1.15 11/21/2014   PSA 0.71 11/21/2014   HGBA1C 6.3 03/27/2015    No results found.  Assessment & Plan:   Reza was seen today for hematuria.  Diagnoses and all orders for this visit:  Blood in urine- his dipstick urinalysis is positive for 3+ blood but is negative for leukocytes, nitrites or protein. -     POCT Urinalysis Dipstick -     CBC with Differential/Platelet; Future -     Basic  metabolic panel; Future -     CT RENAL STONE STUDY; Future  Hematuria, gross- CBC and basic metabolic panel are normal, I've ordered a CT renal protocol to see if he has a stone again. -     CBC with Differential/Platelet; Future -     Basic metabolic panel; Future -     CT RENAL STONE STUDY; Future  Right nephrolithiasis- his CBC and basic metabolic panel are normal. He will take Percocet as needed for pain. I have ordered a stat CT renal protocol to see if he has a stone on the right side. Will advise further as further based on the size of the stone and other factors noted on the CT scan. -     CBC with Differential/Platelet; Future -     Basic metabolic panel; Future -     CT RENAL STONE STUDY; Future -     oxyCODONE-acetaminophen (PERCOCET) 7.5-325  MG tablet; Take 1 tablet by mouth every 4 (four) hours as needed.   I have discontinued Mr. Howald doxycycline. I am also having him start on oxyCODONE-acetaminophen. Additionally, I am having him maintain his aspirin, tadalafil, traMADol, metFORMIN, Alogliptin-Pioglitazone, glimepiride, bisoprolol-hydrochlorothiazide, ibuprofen, atorvastatin, and atorvastatin.  Meds ordered this encounter  Medications  . oxyCODONE-acetaminophen (PERCOCET) 7.5-325 MG tablet    Sig: Take 1 tablet by mouth every 4 (four) hours as needed.    Dispense:  25 tablet    Refill:  0     Follow-up: Return in about 1 week (around 07/30/2015).  Scarlette Calico, MD

## 2015-07-23 NOTE — Progress Notes (Signed)
Pre visit review using our clinic review tool, if applicable. No additional management support is needed unless otherwise documented below in the visit note. 

## 2015-07-23 NOTE — Patient Instructions (Signed)

## 2015-07-28 ENCOUNTER — Other Ambulatory Visit (INDEPENDENT_AMBULATORY_CARE_PROVIDER_SITE_OTHER): Payer: BLUE CROSS/BLUE SHIELD

## 2015-07-28 DIAGNOSIS — E1165 Type 2 diabetes mellitus with hyperglycemia: Secondary | ICD-10-CM | POA: Diagnosis not present

## 2015-07-28 DIAGNOSIS — IMO0002 Reserved for concepts with insufficient information to code with codable children: Secondary | ICD-10-CM

## 2015-07-28 LAB — HEMOGLOBIN A1C: Hgb A1c MFr Bld: 6.8 % — ABNORMAL HIGH (ref 4.6–6.5)

## 2015-07-28 LAB — BASIC METABOLIC PANEL WITH GFR
BUN: 9 mg/dL (ref 6–23)
CO2: 29 meq/L (ref 19–32)
Calcium: 8.8 mg/dL (ref 8.4–10.5)
Chloride: 103 meq/L (ref 96–112)
Creatinine, Ser: 1 mg/dL (ref 0.40–1.50)
GFR: 82.07 mL/min
Glucose, Bld: 165 mg/dL — ABNORMAL HIGH (ref 70–99)
Potassium: 4.1 meq/L (ref 3.5–5.1)
Sodium: 140 meq/L (ref 135–145)

## 2015-07-31 ENCOUNTER — Encounter: Payer: Self-pay | Admitting: Internal Medicine

## 2015-07-31 ENCOUNTER — Ambulatory Visit (INDEPENDENT_AMBULATORY_CARE_PROVIDER_SITE_OTHER): Payer: BLUE CROSS/BLUE SHIELD | Admitting: Internal Medicine

## 2015-07-31 VITALS — BP 118/70 | HR 61 | Wt 261.0 lb

## 2015-07-31 DIAGNOSIS — E1165 Type 2 diabetes mellitus with hyperglycemia: Secondary | ICD-10-CM

## 2015-07-31 DIAGNOSIS — IMO0001 Reserved for inherently not codable concepts without codable children: Secondary | ICD-10-CM

## 2015-07-31 DIAGNOSIS — N200 Calculus of kidney: Secondary | ICD-10-CM | POA: Diagnosis not present

## 2015-07-31 DIAGNOSIS — R310 Gross hematuria: Secondary | ICD-10-CM

## 2015-07-31 DIAGNOSIS — E785 Hyperlipidemia, unspecified: Secondary | ICD-10-CM | POA: Diagnosis not present

## 2015-07-31 NOTE — Assessment & Plan Note (Addendum)
12/16 R nephrolithiasis - one day - resolved. Pt had CT in 2009 Pt declined another CT

## 2015-07-31 NOTE — Assessment & Plan Note (Addendum)
Labs  Amaryl, metformin, Alloglipt- Pioglitazone

## 2015-07-31 NOTE — Progress Notes (Signed)
Subjective:  Patient ID: Gary Fowler, male    DOB: 10/15/1960  Age: 54 y.o. MRN: [id number]  CC: No chief complaint on file.   HPI Gary Fowler presents for DM, HTN, dyslipidemia. Pt had blood in urine and a R flank discomfort the other day - resolved in 1 day  Outpatient Prescriptions Prior to Visit  Medication Sig Dispense Refill  . Alogliptin-Pioglitazone (OSENI) 25-30 MG TABS Take 1 tablet by mouth daily. 90 tablet 3  . aspirin 81 MG chewable tablet Chew 81 mg by mouth daily.      Marland Kitchen atorvastatin (LIPITOR) 20 MG tablet TAKE ONE TABLET BY MOUTH ONCE DAILY AT  6PM 90 tablet 0  . atorvastatin (LIPITOR) 20 MG tablet Take 1 tablet (20 mg total) by mouth daily at 6 PM. 90 tablet 3  . bisoprolol-hydrochlorothiazide (ZIAC) 10-6.25 MG per tablet TAKE ONE TABLET BY MOUTH ONCE DAILY 90 tablet 3  . glimepiride (AMARYL) 2 MG tablet TAKE ONE TABLET BY MOUTH TWICE DAILY 180 tablet 3  . ibuprofen (ADVIL,MOTRIN) 600 MG tablet TAKE ONE TABLET BY MOUTH TWICE DAILY AS NEEDED FOR PAIN 180 tablet 0  . metFORMIN (GLUCOPHAGE) 1000 MG tablet TAKE ONE TABLET BY MOUTH TWICE DAILY WITH A MEAL 180 tablet 3  . oxyCODONE-acetaminophen (PERCOCET) 7.5-325 MG tablet Take 1 tablet by mouth every 4 (four) hours as needed. 25 tablet 0  . tadalafil (CIALIS) 20 MG tablet Take 20 mg by mouth as directed. Every 3 days as needed     . traMADol (ULTRAM) 50 MG tablet as needed.  0   No facility-administered medications prior to visit.    ROS Review of Systems  Constitutional: Positive for unexpected weight change. Negative for appetite change and fatigue.  HENT: Negative for congestion, nosebleeds, sneezing, sore throat and trouble swallowing.   Eyes: Negative for itching and visual disturbance.  Respiratory: Negative for cough.   Cardiovascular: Negative for chest pain, palpitations and leg swelling.  Gastrointestinal: Negative for nausea, diarrhea, blood in stool and abdominal distention.  Genitourinary:  Negative for frequency and hematuria.  Musculoskeletal: Negative for back pain, joint swelling, gait problem and neck pain.  Skin: Negative for rash.  Neurological: Negative for dizziness, tremors, speech difficulty and weakness.  Psychiatric/Behavioral: Negative for sleep disturbance, dysphoric mood, decreased concentration and agitation. The patient is not nervous/anxious.     Objective:  BP 118/70 mmHg  Pulse 61  Wt 261 lb (118.389 kg)  SpO2 93%  BP Readings from Last 3 Encounters:  07/31/15 118/70  07/23/15 132/86  03/31/15 128/80    Wt Readings from Last 3 Encounters:  07/31/15 261 lb (118.389 kg)  07/23/15 260 lb 12 oz (118.275 kg)  03/31/15 249 lb (112.946 kg)    Physical Exam  Constitutional: He is oriented to person, place, and time. He appears well-developed. No distress.  NAD  HENT:  Mouth/Throat: Oropharynx is clear and moist.  Eyes: Conjunctivae are normal. Pupils are equal, round, and reactive to light.  Neck: Normal range of motion. No JVD present. No thyromegaly present.  Cardiovascular: Normal rate, regular rhythm, normal heart sounds and intact distal pulses.  Exam reveals no gallop and no friction rub.   No murmur heard. Pulmonary/Chest: Effort normal and breath sounds normal. No respiratory distress. He has no wheezes. He has no rales. He exhibits no tenderness.  Abdominal: Soft. Bowel sounds are normal. He exhibits no distension and no mass. There is no tenderness. There is no rebound and no guarding.  Musculoskeletal:  Normal range of motion. He exhibits no edema or tenderness.  Lymphadenopathy:    He has no cervical adenopathy.  Neurological: He is alert and oriented to person, place, and time. He has normal reflexes. No cranial nerve deficit. He exhibits normal muscle tone. He displays a negative Romberg sign. Coordination and gait normal.  Skin: Skin is warm and dry. No rash noted.  Psychiatric: He has a normal mood and affect. His behavior is normal.  Judgment and thought content normal.  Obese  Lab Results  Component Value Date   WBC 9.1 07/23/2015   HGB 15.0 07/23/2015   HCT 44.5 07/23/2015   PLT 176.0 07/23/2015   GLUCOSE 165* 07/28/2015   CHOL 127 11/21/2014   TRIG 115.0 11/21/2014   HDL 33.70* 11/21/2014   LDLDIRECT 56.3 11/20/2012   LDLCALC 70 11/21/2014   ALT 16 11/21/2014   AST 21 11/21/2014   NA 140 07/28/2015   K 4.1 07/28/2015   CL 103 07/28/2015   CREATININE 1.00 07/28/2015   BUN 9 07/28/2015   CO2 29 07/28/2015   TSH 1.15 11/21/2014   PSA 0.71 11/21/2014   HGBA1C 6.8* 07/28/2015    No results found.  Assessment & Plan:   Diagnoses and all orders for this visit:  Uncontrolled type 2 diabetes mellitus without complication, without long-term current use of insulin (HCC)  Dyslipidemia  Hematuria, gross  Right nephrolithiasis   I am having Gary Fowler maintain his aspirin, tadalafil, traMADol, metFORMIN, Alogliptin-Pioglitazone, glimepiride, bisoprolol-hydrochlorothiazide, ibuprofen, atorvastatin, atorvastatin, and oxyCODONE-acetaminophen.  No orders of the defined types were placed in this encounter.     Follow-up: Return in about 4 months (around 11/29/2015) for Wellness Exam.  Walker Kehr, MD

## 2015-07-31 NOTE — Assessment & Plan Note (Addendum)
12/16 R nephrolithiasis - one day - resolved Pt had CT in 2009. Pt declined CT that was finally approved by his insurance this time

## 2015-07-31 NOTE — Progress Notes (Signed)
Pre visit review using our clinic review tool, if applicable. No additional management support is needed unless otherwise documented below in the visit note. 

## 2015-07-31 NOTE — Assessment & Plan Note (Signed)
On Lipitor 

## 2015-11-27 ENCOUNTER — Other Ambulatory Visit (INDEPENDENT_AMBULATORY_CARE_PROVIDER_SITE_OTHER): Payer: BLUE CROSS/BLUE SHIELD

## 2015-11-27 DIAGNOSIS — E119 Type 2 diabetes mellitus without complications: Secondary | ICD-10-CM

## 2015-11-27 LAB — BASIC METABOLIC PANEL
BUN: 14 mg/dL (ref 6–23)
CALCIUM: 8.9 mg/dL (ref 8.4–10.5)
CO2: 28 meq/L (ref 19–32)
Chloride: 103 mEq/L (ref 96–112)
Creatinine, Ser: 0.92 mg/dL (ref 0.40–1.50)
GFR: 90.25 mL/min (ref >60.00)
Glucose, Bld: 166 mg/dL — ABNORMAL HIGH (ref 70–99)
POTASSIUM: 3.9 meq/L (ref 3.5–5.1)
SODIUM: 138 meq/L (ref 135–145)

## 2015-11-27 LAB — HEMOGLOBIN A1C: Hgb A1c MFr Bld: 7 % — ABNORMAL HIGH (ref 4.6–6.5)

## 2015-12-01 ENCOUNTER — Encounter: Payer: Self-pay | Admitting: Internal Medicine

## 2015-12-01 ENCOUNTER — Ambulatory Visit (INDEPENDENT_AMBULATORY_CARE_PROVIDER_SITE_OTHER): Payer: BLUE CROSS/BLUE SHIELD | Admitting: Internal Medicine

## 2015-12-01 ENCOUNTER — Encounter: Payer: Self-pay | Admitting: *Deleted

## 2015-12-01 VITALS — BP 120/78 | HR 61 | Ht 69.0 in | Wt 263.0 lb

## 2015-12-01 DIAGNOSIS — Z0000 Encounter for general adult medical examination without abnormal findings: Secondary | ICD-10-CM | POA: Diagnosis not present

## 2015-12-01 DIAGNOSIS — I10 Essential (primary) hypertension: Secondary | ICD-10-CM | POA: Diagnosis not present

## 2015-12-01 DIAGNOSIS — E669 Obesity, unspecified: Secondary | ICD-10-CM

## 2015-12-01 DIAGNOSIS — E119 Type 2 diabetes mellitus without complications: Secondary | ICD-10-CM

## 2015-12-01 DIAGNOSIS — E1165 Type 2 diabetes mellitus with hyperglycemia: Secondary | ICD-10-CM | POA: Diagnosis not present

## 2015-12-01 DIAGNOSIS — IMO0001 Reserved for inherently not codable concepts without codable children: Secondary | ICD-10-CM

## 2015-12-01 NOTE — Assessment & Plan Note (Signed)
We discussed age appropriate health related issues, including available/recomended screening tests and vaccinations. We discussed a need for adhering to healthy diet and exercise. Labs/EKG were reviewed/ordered. All questions were answered.   

## 2015-12-01 NOTE — Assessment & Plan Note (Addendum)
Wt Readings from Last 3 Encounters:  12/01/15 263 lb (119.296 kg)  07/31/15 261 lb (118.389 kg)  07/23/15 260 lb 12 oz (118.275 kg)

## 2015-12-01 NOTE — Progress Notes (Signed)
Pre visit review using our clinic review tool, if applicable. No additional management support is needed unless otherwise documented below in the visit note. 

## 2015-12-01 NOTE — Assessment & Plan Note (Signed)
BP Readings from Last 3 Encounters:  12/01/15 120/78  07/31/15 118/70  07/23/15 132/86

## 2015-12-01 NOTE — Assessment & Plan Note (Signed)
Amaryl, metformin, Alloglipt- Pioglitazone 

## 2015-12-01 NOTE — Progress Notes (Signed)
Subjective:  Patient ID: Gary Fowler, male    DOB: June 20, 1961  Age: 55 y.o. MRN: [id number]  CC: Annual Exam   HPI Gary Fowler presents for a well exam  Outpatient Prescriptions Prior to Visit  Medication Sig Dispense Refill  . Alogliptin-Pioglitazone (OSENI) 25-30 MG TABS Take 1 tablet by mouth daily. 90 tablet 3  . aspirin 81 MG chewable tablet Chew 81 mg by mouth daily.      Marland Kitchen atorvastatin (LIPITOR) 20 MG tablet TAKE ONE TABLET BY MOUTH ONCE DAILY AT  6PM 90 tablet 0  . atorvastatin (LIPITOR) 20 MG tablet Take 1 tablet (20 mg total) by mouth daily at 6 PM. 90 tablet 3  . bisoprolol-hydrochlorothiazide (ZIAC) 10-6.25 MG per tablet TAKE ONE TABLET BY MOUTH ONCE DAILY 90 tablet 3  . glimepiride (AMARYL) 2 MG tablet TAKE ONE TABLET BY MOUTH TWICE DAILY 180 tablet 3  . ibuprofen (ADVIL,MOTRIN) 600 MG tablet TAKE ONE TABLET BY MOUTH TWICE DAILY AS NEEDED FOR PAIN 180 tablet 0  . metFORMIN (GLUCOPHAGE) 1000 MG tablet TAKE ONE TABLET BY MOUTH TWICE DAILY WITH A MEAL 180 tablet 3  . oxyCODONE-acetaminophen (PERCOCET) 7.5-325 MG tablet Take 1 tablet by mouth every 4 (four) hours as needed. 25 tablet 0  . tadalafil (CIALIS) 20 MG tablet Take 20 mg by mouth as directed. Every 3 days as needed     . traMADol (ULTRAM) 50 MG tablet as needed.  0   No facility-administered medications prior to visit.    ROS Review of Systems  Constitutional: Negative for appetite change, fatigue and unexpected weight change.  HENT: Negative for congestion, nosebleeds, sneezing, sore throat and trouble swallowing.   Eyes: Negative for itching and visual disturbance.  Respiratory: Negative for cough.   Cardiovascular: Negative for chest pain, palpitations and leg swelling.  Gastrointestinal: Negative for nausea, diarrhea, blood in stool and abdominal distention.  Genitourinary: Negative for frequency and hematuria.  Musculoskeletal: Negative for back pain, joint swelling, gait problem and neck  pain.  Skin: Negative for rash.  Neurological: Negative for dizziness, tremors, speech difficulty and weakness.  Psychiatric/Behavioral: Negative for suicidal ideas, sleep disturbance, dysphoric mood and agitation. The patient is not nervous/anxious.     Objective:  BP 120/78 mmHg  Pulse 61  Ht 5\' 9"  (1.753 m)  Wt 263 lb (119.296 kg)  BMI 38.82 kg/m2  SpO2 95%  BP Readings from Last 3 Encounters:  12/01/15 120/78  07/31/15 118/70  07/23/15 132/86    Wt Readings from Last 3 Encounters:  12/01/15 263 lb (119.296 kg)  07/31/15 261 lb (118.389 kg)  07/23/15 260 lb 12 oz (118.275 kg)    Physical Exam  Constitutional: He is oriented to person, place, and time. He appears well-developed and well-nourished. No distress.  HENT:  Head: Normocephalic and atraumatic.  Right Ear: External ear normal.  Left Ear: External ear normal.  Nose: Nose normal.  Mouth/Throat: Oropharynx is clear and moist. No oropharyngeal exudate.  Eyes: Conjunctivae and EOM are normal. Pupils are equal, round, and reactive to light. Right eye exhibits no discharge. Left eye exhibits no discharge. No scleral icterus.  Neck: Normal range of motion. Neck supple. No JVD present. No tracheal deviation present. No thyromegaly present.  Cardiovascular: Normal rate, regular rhythm, normal heart sounds and intact distal pulses.  Exam reveals no gallop and no friction rub.   No murmur heard. Pulmonary/Chest: Effort normal and breath sounds normal. No stridor. No respiratory distress. He has no wheezes. He  has no rales. He exhibits no tenderness.  Abdominal: Soft. Bowel sounds are normal. He exhibits no distension and no mass. There is no tenderness. There is no rebound and no guarding.  Genitourinary: Rectum normal, prostate normal and penis normal. Guaiac negative stool. No penile tenderness.  Musculoskeletal: Normal range of motion. He exhibits no edema or tenderness.  Lymphadenopathy:    He has no cervical adenopathy.   Neurological: He is alert and oriented to person, place, and time. He has normal reflexes. No cranial nerve deficit. He exhibits normal muscle tone. Coordination normal.  Skin: Skin is warm and dry. No rash noted. He is not diaphoretic. No erythema. No pallor.  Psychiatric: He has a normal mood and affect. His behavior is normal. Judgment and thought content normal.  Obese   Lab Results  Component Value Date   WBC 9.1 07/23/2015   HGB 15.0 07/23/2015   HCT 44.5 07/23/2015   PLT 176.0 07/23/2015   GLUCOSE 166* 11/27/2015   CHOL 127 11/21/2014   TRIG 115.0 11/21/2014   HDL 33.70* 11/21/2014   LDLDIRECT 56.3 11/20/2012   LDLCALC 70 11/21/2014   ALT 16 11/21/2014   AST 21 11/21/2014   NA 138 11/27/2015   K 3.9 11/27/2015   CL 103 11/27/2015   CREATININE 0.92 11/27/2015   BUN 14 11/27/2015   CO2 28 11/27/2015   TSH 1.15 11/21/2014   PSA 0.71 11/21/2014   HGBA1C 7.0* 11/27/2015    No results found.  Assessment & Plan:   There are no diagnoses linked to this encounter. I am having [name] maintain his aspirin, tadalafil, traMADol, metFORMIN, Alogliptin-Pioglitazone, glimepiride, bisoprolol-hydrochlorothiazide, ibuprofen, atorvastatin, atorvastatin, and oxyCODONE-acetaminophen.  No orders of the defined types were placed in this encounter.     Follow-up: No Follow-up on file.  Walker Kehr, MD

## 2016-01-13 ENCOUNTER — Other Ambulatory Visit: Payer: Self-pay | Admitting: Internal Medicine

## 2016-01-21 ENCOUNTER — Other Ambulatory Visit: Payer: Self-pay | Admitting: Internal Medicine

## 2016-02-23 ENCOUNTER — Other Ambulatory Visit: Payer: Self-pay | Admitting: Internal Medicine

## 2016-03-25 ENCOUNTER — Other Ambulatory Visit (INDEPENDENT_AMBULATORY_CARE_PROVIDER_SITE_OTHER): Payer: BLUE CROSS/BLUE SHIELD

## 2016-03-25 DIAGNOSIS — E1165 Type 2 diabetes mellitus with hyperglycemia: Secondary | ICD-10-CM | POA: Diagnosis not present

## 2016-03-25 DIAGNOSIS — IMO0001 Reserved for inherently not codable concepts without codable children: Secondary | ICD-10-CM

## 2016-03-25 DIAGNOSIS — Z0000 Encounter for general adult medical examination without abnormal findings: Secondary | ICD-10-CM | POA: Diagnosis not present

## 2016-03-25 LAB — BASIC METABOLIC PANEL
BUN: 10 mg/dL (ref 6–23)
CHLORIDE: 103 meq/L (ref 96–112)
CO2: 28 meq/L (ref 19–32)
CREATININE: 0.89 mg/dL (ref 0.40–1.50)
Calcium: 8.9 mg/dL (ref 8.4–10.5)
GFR: 93.66 mL/min (ref >60.00)
GLUCOSE: 146 mg/dL — AB (ref 70–99)
Potassium: 3.9 mEq/L (ref 3.5–5.1)
Sodium: 141 mEq/L (ref 135–145)

## 2016-03-25 LAB — URINALYSIS
Bilirubin Urine: NEGATIVE
HGB URINE DIPSTICK: NEGATIVE
Ketones, ur: NEGATIVE
LEUKOCYTES UA: NEGATIVE
NITRITE: NEGATIVE
Specific Gravity, Urine: >=1.03 — AB (ref 1.000–1.030)
UROBILINOGEN UA: 0.2 (ref 0.0–1.0)
Urine Glucose: NEGATIVE
pH: 6 (ref 5.0–8.0)

## 2016-03-25 LAB — LIPID PANEL
CHOLESTEROL: 110 mg/dL (ref 0–200)
HDL: 34 mg/dL — ABNORMAL LOW (ref >39.00)
LDL CALC: 52 mg/dL (ref 0–99)
NonHDL: 76.38
TRIGLYCERIDES: 120 mg/dL (ref 0.0–149.0)
Total CHOL/HDL Ratio: 3
VLDL: 24 mg/dL (ref 0.0–40.0)

## 2016-03-25 LAB — HEPATIC FUNCTION PANEL
ALT: 14 U/L (ref 0–53)
AST: 21 U/L (ref 0–37)
Albumin: 4.2 g/dL (ref 3.5–5.2)
Alkaline Phosphatase: 71 U/L (ref 39–117)
BILIRUBIN TOTAL: 0.9 mg/dL (ref 0.2–1.2)
Bilirubin, Direct: 0.2 mg/dL (ref 0.0–0.3)
TOTAL PROTEIN: 6.6 g/dL (ref 6.0–8.3)

## 2016-03-25 LAB — CBC WITH DIFFERENTIAL/PLATELET
BASOS PCT: 0.5 % (ref 0.0–3.0)
Basophils Absolute: 0 10*3/uL (ref 0.0–0.1)
EOS PCT: 2.5 % (ref 0.0–5.0)
Eosinophils Absolute: 0.2 10*3/uL (ref 0.0–0.7)
HCT: 42.8 % (ref 39.0–52.0)
HEMOGLOBIN: 14.5 g/dL (ref 13.0–17.0)
LYMPHS ABS: 2.1 10*3/uL (ref 0.7–4.0)
Lymphocytes Relative: 30.3 % (ref 12.0–46.0)
MCHC: 33.9 g/dL (ref 30.0–36.0)
MCV: 84.5 fl (ref 78.0–100.0)
MONO ABS: 0.3 10*3/uL (ref 0.1–1.0)
Monocytes Relative: 5 % (ref 3.0–12.0)
Neutro Abs: 4.2 10*3/uL (ref 1.4–7.7)
Neutrophils Relative %: 61.7 % (ref 43.0–77.0)
Platelets: 137 10*3/uL — ABNORMAL LOW (ref 150.0–400.0)
RBC: 5.07 Mil/uL (ref 4.22–5.81)
RDW: 13.2 % (ref 11.5–15.5)
WBC: 6.9 10*3/uL (ref 4.0–10.5)

## 2016-03-25 LAB — HEMOGLOBIN A1C: Hgb A1c MFr Bld: 6.9 % — ABNORMAL HIGH (ref 4.6–6.5)

## 2016-03-25 LAB — PSA: PSA: 0.63 ng/mL (ref 0.10–4.00)

## 2016-03-25 LAB — TSH: TSH: 1.75 u[IU]/mL (ref 0.35–4.50)

## 2016-03-26 LAB — HEPATITIS C ANTIBODY: HCV Ab: NEGATIVE

## 2016-04-01 ENCOUNTER — Ambulatory Visit (INDEPENDENT_AMBULATORY_CARE_PROVIDER_SITE_OTHER): Payer: BLUE CROSS/BLUE SHIELD | Admitting: Internal Medicine

## 2016-04-01 ENCOUNTER — Encounter: Payer: Self-pay | Admitting: Internal Medicine

## 2016-04-01 DIAGNOSIS — I10 Essential (primary) hypertension: Secondary | ICD-10-CM | POA: Diagnosis not present

## 2016-04-01 DIAGNOSIS — E119 Type 2 diabetes mellitus without complications: Secondary | ICD-10-CM | POA: Diagnosis not present

## 2016-04-01 DIAGNOSIS — E669 Obesity, unspecified: Secondary | ICD-10-CM

## 2016-04-01 DIAGNOSIS — N529 Male erectile dysfunction, unspecified: Secondary | ICD-10-CM

## 2016-04-01 DIAGNOSIS — R252 Cramp and spasm: Secondary | ICD-10-CM | POA: Diagnosis not present

## 2016-04-01 MED ORDER — TADALAFIL 20 MG PO TABS: 20.0000 mg | ORAL_TABLET | ORAL | 5 refills | Status: DC

## 2016-04-01 NOTE — Assessment & Plan Note (Signed)
Tylenol PM for cramps, Hold Lipitor x 2 weeks [facility]

## 2016-04-01 NOTE — Progress Notes (Signed)
Pre visit review using our clinic review tool, if applicable. No additional management support is needed unless otherwise documented below in the visit note. 

## 2016-04-01 NOTE — Assessment & Plan Note (Signed)
Cialis prn 

## 2016-04-01 NOTE — Patient Instructions (Signed)
Tylenol PM for cramps, Hold Lipitor x 2 weeks

## 2016-04-01 NOTE — Assessment & Plan Note (Signed)
Amaryl, metformin, Alloglipt- Pioglitazone 

## 2016-04-01 NOTE — Assessment & Plan Note (Signed)
Tylenol PM for cramps, Hold Lipitor x 2 weeks

## 2016-04-01 NOTE — Assessment & Plan Note (Signed)
Wt Readings from Last 3 Encounters:  04/01/16 258 lb (117 kg)  12/01/15 263 lb (119.3 kg)  07/31/15 261 lb (118.4 kg)

## 2016-05-05 ENCOUNTER — Other Ambulatory Visit: Payer: Self-pay | Admitting: Internal Medicine

## 2016-07-27 ENCOUNTER — Other Ambulatory Visit (INDEPENDENT_AMBULATORY_CARE_PROVIDER_SITE_OTHER): Payer: BLUE CROSS/BLUE SHIELD

## 2016-07-27 DIAGNOSIS — E1165 Type 2 diabetes mellitus with hyperglycemia: Secondary | ICD-10-CM

## 2016-07-27 DIAGNOSIS — E119 Type 2 diabetes mellitus without complications: Secondary | ICD-10-CM

## 2016-07-27 DIAGNOSIS — IMO0002 Reserved for concepts with insufficient information to code with codable children: Secondary | ICD-10-CM

## 2016-07-27 LAB — BASIC METABOLIC PANEL
BUN: 12 mg/dL (ref 6–23)
CALCIUM: 9.3 mg/dL (ref 8.4–10.5)
CO2: 29 meq/L (ref 19–32)
CREATININE: 0.86 mg/dL (ref 0.40–1.50)
Chloride: 104 mEq/L (ref 96–112)
GFR: 97.33 mL/min (ref >60.00)
Glucose, Bld: 157 mg/dL — ABNORMAL HIGH (ref 70–99)
Potassium: 3.6 mEq/L (ref 3.5–5.1)
Sodium: 140 mEq/L (ref 135–145)

## 2016-07-27 LAB — HEMOGLOBIN A1C: HEMOGLOBIN A1C: 6.9 % — AB (ref 4.6–6.5)

## 2016-08-02 ENCOUNTER — Encounter: Payer: Self-pay | Admitting: Internal Medicine

## 2016-08-02 ENCOUNTER — Ambulatory Visit (INDEPENDENT_AMBULATORY_CARE_PROVIDER_SITE_OTHER): Payer: BLUE CROSS/BLUE SHIELD | Admitting: Internal Medicine

## 2016-08-02 VITALS — BP 120/70 | HR 57 | Temp 97.9°F | Wt 258.0 lb

## 2016-08-02 DIAGNOSIS — Z0000 Encounter for general adult medical examination without abnormal findings: Secondary | ICD-10-CM | POA: Diagnosis not present

## 2016-08-02 DIAGNOSIS — I10 Essential (primary) hypertension: Secondary | ICD-10-CM

## 2016-08-02 DIAGNOSIS — E785 Hyperlipidemia, unspecified: Secondary | ICD-10-CM

## 2016-08-02 DIAGNOSIS — E119 Type 2 diabetes mellitus without complications: Secondary | ICD-10-CM | POA: Diagnosis not present

## 2016-08-02 NOTE — Assessment & Plan Note (Signed)
On Bisoprolol HCT 

## 2016-08-02 NOTE — Assessment & Plan Note (Signed)
On Lipitor 

## 2016-08-02 NOTE — Progress Notes (Signed)
Subjective:  Patient ID: Gary Fowler, male    DOB: Dec 17, 1960  Age: 55 y.o. MRN: [id number]  CC: No chief complaint on file.   HPI Gary Fowler presents for dyslipidemia, DM, HTN f/u  Outpatient Medications Prior to Visit  Medication Sig Dispense Refill  . aspirin 81 MG chewable tablet Chew 81 mg by mouth daily.      Marland Kitchen atorvastatin (LIPITOR) 20 MG tablet TAKE ONE TABLET BY MOUTH ONCE DAILY AT  6PM 90 tablet 0  . bisoprolol-hydrochlorothiazide (ZIAC) 10-6.25 MG tablet TAKE ONE TABLET BY MOUTH ONCE DAILY 90 tablet 3  . glimepiride (AMARYL) 2 MG tablet TAKE ONE TABLET BY MOUTH TWICE DAILY 180 tablet 3  . ibuprofen (ADVIL,MOTRIN) 600 MG tablet TAKE ONE TABLET BY MOUTH TWICE DAILY AS NEEDED FOR PAIN 180 tablet 0  . metFORMIN (GLUCOPHAGE) 1000 MG tablet TAKE ONE TABLET BY MOUTH TWICE DAILY WITH MEALS 180 tablet 3  . OSENI 25-30 MG TABS TAKE ONE TABLET BY MOUTH ONCE DAILY 90 tablet 2  . tadalafil (CIALIS) 20 MG tablet Take 1 tablet (20 mg total) by mouth as directed. Every 3 days as needed 30 tablet 5  . traMADol (ULTRAM) 50 MG tablet as needed.  0   No facility-administered medications prior to visit.     ROS Review of Systems  Constitutional: Negative for appetite change, fatigue and unexpected weight change.  HENT: Negative for congestion, nosebleeds, sneezing, sore throat and trouble swallowing.   Eyes: Negative for itching and visual disturbance.  Respiratory: Negative for cough.   Cardiovascular: Negative for chest pain, palpitations and leg swelling.  Gastrointestinal: Negative for abdominal distention, blood in stool, diarrhea and nausea.  Genitourinary: Negative for frequency and hematuria.  Musculoskeletal: Negative for back pain, gait problem, joint swelling and neck pain.  Skin: Negative for rash.  Neurological: Negative for dizziness, tremors, speech difficulty and weakness.  Psychiatric/Behavioral: Negative for agitation, dysphoric mood and sleep disturbance.  The patient is not nervous/anxious.     Objective:  BP 120/70   Pulse (!) 57   Temp 97.9 F (36.6 C)   Wt 258 lb (117 kg)   SpO2 100%   BMI 38.10 kg/m   BP Readings from Last 3 Encounters:  08/02/16 120/70  04/01/16 120/82  12/01/15 120/78    Wt Readings from Last 3 Encounters:  08/02/16 258 lb (117 kg)  04/01/16 258 lb (117 kg)  12/01/15 263 lb (119.3 kg)    Physical Exam  Constitutional: He is oriented to person, place, and time. He appears well-developed. No distress.  NAD  HENT:  Mouth/Throat: Oropharynx is clear and moist.  Eyes: Conjunctivae are normal. Pupils are equal, round, and reactive to light.  Neck: Normal range of motion. No JVD present. No thyromegaly present.  Cardiovascular: Normal rate, regular rhythm, normal heart sounds and intact distal pulses.  Exam reveals no gallop and no friction rub.   No murmur heard. Pulmonary/Chest: Effort normal and breath sounds normal. No respiratory distress. He has no wheezes. He has no rales. He exhibits no tenderness.  Abdominal: Soft. Bowel sounds are normal. He exhibits no distension and no mass. There is no tenderness. There is no rebound and no guarding.  Musculoskeletal: Normal range of motion. He exhibits no edema or tenderness.  Lymphadenopathy:    He has no cervical adenopathy.  Neurological: He is alert and oriented to person, place, and time. He has normal reflexes. No cranial nerve deficit. He exhibits normal muscle tone. He displays a  negative Romberg sign. Coordination and gait normal.  Skin: Skin is warm and dry. No rash noted.  Psychiatric: He has a normal mood and affect. His behavior is normal. Judgment and thought content normal.    Lab Results  Component Value Date   WBC 6.9 03/25/2016   HGB 14.5 03/25/2016   HCT 42.8 03/25/2016   PLT 137.0 (L) 03/25/2016   GLUCOSE 157 (H) 07/27/2016   CHOL 110 03/25/2016   TRIG 120.0 03/25/2016   HDL 34.00 (L) 03/25/2016   LDLDIRECT 56.3 11/20/2012    LDLCALC 52 03/25/2016   ALT 14 03/25/2016   AST 21 03/25/2016   NA 140 07/27/2016   K 3.6 07/27/2016   CL 104 07/27/2016   CREATININE 0.86 07/27/2016   BUN 12 07/27/2016   CO2 29 07/27/2016   TSH 1.75 03/25/2016   PSA 0.63 03/25/2016   HGBA1C 6.9 (H) 07/27/2016    No results found.  Assessment & Plan:   There are no diagnoses linked to this encounter. I am having [name] maintain his aspirin, traMADol, ibuprofen, atorvastatin, metFORMIN, OSENI, glimepiride, tadalafil, and bisoprolol-hydrochlorothiazide.  No orders of the defined types were placed in this encounter.    Follow-up: No Follow-up on file.  Walker Kehr, MD

## 2016-08-02 NOTE — Assessment & Plan Note (Signed)
Amaryl, Allogl-Piogl, Metformin

## 2016-08-02 NOTE — Progress Notes (Signed)
Pre visit review using our clinic review tool, if applicable. No additional management support is needed unless otherwise documented below in the visit note. 

## 2016-11-06 ENCOUNTER — Other Ambulatory Visit: Payer: Self-pay | Admitting: Internal Medicine

## 2016-11-10 ENCOUNTER — Other Ambulatory Visit: Payer: Self-pay | Admitting: Internal Medicine

## 2016-11-24 ENCOUNTER — Other Ambulatory Visit (INDEPENDENT_AMBULATORY_CARE_PROVIDER_SITE_OTHER): Payer: BLUE CROSS/BLUE SHIELD

## 2016-11-24 DIAGNOSIS — I10 Essential (primary) hypertension: Secondary | ICD-10-CM

## 2016-11-24 DIAGNOSIS — Z0000 Encounter for general adult medical examination without abnormal findings: Secondary | ICD-10-CM

## 2016-11-24 LAB — HEMOGLOBIN A1C: HEMOGLOBIN A1C: 7.1 % — AB (ref 4.6–6.5)

## 2016-11-24 LAB — HEPATIC FUNCTION PANEL
ALBUMIN: 4.3 g/dL (ref 3.5–5.2)
ALK PHOS: 62 U/L (ref 39–117)
ALT: 26 U/L (ref 0–53)
AST: 38 U/L — ABNORMAL HIGH (ref 0–37)
BILIRUBIN TOTAL: 0.5 mg/dL (ref 0.2–1.2)
Bilirubin, Direct: 0.1 mg/dL (ref 0.0–0.3)
Total Protein: 6.7 g/dL (ref 6.0–8.3)

## 2016-11-24 LAB — MICROALBUMIN / CREATININE URINE RATIO
Creatinine,U: 227.9 mg/dL
MICROALB UR: 0.7 mg/dL (ref 0.0–1.9)
MICROALB/CREAT RATIO: 0.3 mg/g (ref 0.0–30.0)

## 2016-11-24 LAB — CBC WITH DIFFERENTIAL/PLATELET
BASOS PCT: 0.5 % (ref 0.0–3.0)
Basophils Absolute: 0 10*3/uL (ref 0.0–0.1)
EOS PCT: 2.4 % (ref 0.0–5.0)
Eosinophils Absolute: 0.2 10*3/uL (ref 0.0–0.7)
HCT: 43.9 % (ref 39.0–52.0)
Hemoglobin: 15 g/dL (ref 13.0–17.0)
LYMPHS ABS: 2.2 10*3/uL (ref 0.7–4.0)
Lymphocytes Relative: 33.3 % (ref 12.0–46.0)
MCHC: 34.1 g/dL (ref 30.0–36.0)
MCV: 85.6 fl (ref 78.0–100.0)
MONO ABS: 0.4 10*3/uL (ref 0.1–1.0)
Monocytes Relative: 5.7 % (ref 3.0–12.0)
NEUTROS ABS: 3.9 10*3/uL (ref 1.4–7.7)
Neutrophils Relative %: 58.1 % (ref 43.0–77.0)
PLATELETS: 167 10*3/uL (ref 150.0–400.0)
RBC: 5.14 Mil/uL (ref 4.22–5.81)
RDW: 13.2 % (ref 11.5–15.5)
WBC: 6.6 10*3/uL (ref 4.0–10.5)

## 2016-11-24 LAB — URINALYSIS
Bilirubin Urine: NEGATIVE
HGB URINE DIPSTICK: NEGATIVE
Leukocytes, UA: NEGATIVE
Nitrite: NEGATIVE
Specific Gravity, Urine: 1.025 (ref 1.000–1.030)
Total Protein, Urine: NEGATIVE
URINE GLUCOSE: 250 — AB
UROBILINOGEN UA: 0.2 (ref 0.0–1.0)
pH: 6 (ref 5.0–8.0)

## 2016-11-24 LAB — LIPID PANEL
Cholesterol: 154 mg/dL (ref 0–200)
HDL: 33.9 mg/dL — ABNORMAL LOW (ref >39.00)
LDL Cholesterol: 81 mg/dL (ref 0–99)
NONHDL: 120.05
Total CHOL/HDL Ratio: 5
Triglycerides: 194 mg/dL — ABNORMAL HIGH (ref 0.0–149.0)
VLDL: 38.8 mg/dL (ref 0.0–40.0)

## 2016-11-24 LAB — BASIC METABOLIC PANEL WITH GFR
BUN: 12 mg/dL (ref 6–23)
CO2: 28 meq/L (ref 19–32)
Calcium: 9 mg/dL (ref 8.4–10.5)
Chloride: 103 meq/L (ref 96–112)
Creatinine, Ser: 0.89 mg/dL (ref 0.40–1.50)
GFR: 93.44 mL/min
Glucose, Bld: 185 mg/dL — ABNORMAL HIGH (ref 70–99)
Potassium: 4 meq/L (ref 3.5–5.1)
Sodium: 141 meq/L (ref 135–145)

## 2016-11-24 LAB — PSA: PSA: 0.57 ng/mL (ref 0.10–4.00)

## 2016-11-24 LAB — TSH: TSH: 2.66 u[IU]/mL (ref 0.35–4.50)

## 2016-11-29 ENCOUNTER — Ambulatory Visit (INDEPENDENT_AMBULATORY_CARE_PROVIDER_SITE_OTHER): Payer: BLUE CROSS/BLUE SHIELD | Admitting: Internal Medicine

## 2016-11-29 ENCOUNTER — Encounter: Payer: Self-pay | Admitting: Internal Medicine

## 2016-11-29 VITALS — BP 132/74 | HR 63 | Temp 98.5°F | Ht 69.0 in | Wt 262.1 lb

## 2016-11-29 DIAGNOSIS — Z6838 Body mass index (BMI) 38.0-38.9, adult: Secondary | ICD-10-CM

## 2016-11-29 DIAGNOSIS — E785 Hyperlipidemia, unspecified: Secondary | ICD-10-CM | POA: Diagnosis not present

## 2016-11-29 DIAGNOSIS — Z0000 Encounter for general adult medical examination without abnormal findings: Secondary | ICD-10-CM | POA: Diagnosis not present

## 2016-11-29 DIAGNOSIS — E119 Type 2 diabetes mellitus without complications: Secondary | ICD-10-CM

## 2016-11-29 DIAGNOSIS — I10 Essential (primary) hypertension: Secondary | ICD-10-CM

## 2016-11-29 DIAGNOSIS — Z23 Encounter for immunization: Secondary | ICD-10-CM | POA: Diagnosis not present

## 2016-11-29 NOTE — Assessment & Plan Note (Signed)
Amaryl, Metformin, Allogl-Pioglitazone

## 2016-11-29 NOTE — Addendum Note (Signed)
Addended by: Karren Cobble on: 11/29/2016 09:02 AM   Modules accepted: Orders

## 2016-11-29 NOTE — Assessment & Plan Note (Signed)
On Bisoprolol

## 2016-11-29 NOTE — Assessment & Plan Note (Signed)
Wt Readings from Last 3 Encounters:  11/29/16 262 lb 1.3 oz (118.9 kg)  08/02/16 258 lb (117 kg)  04/01/16 258 lb (117 kg)

## 2016-11-29 NOTE — Assessment & Plan Note (Signed)
On Lipitor 

## 2016-11-29 NOTE — Progress Notes (Signed)
Pre visit review using our clinic review tool, if applicable. No additional management support is needed unless otherwise documented below in the visit note. 

## 2016-11-29 NOTE — Progress Notes (Signed)
Subjective:  Patient ID: Gary Fowler, male    DOB: 1960/12/11  Age: 56 y.o. MRN: [id number]  CC: No chief complaint on file.   HPI NAPOLEAN SIA presents for DM, HTN  Outpatient Medications Prior to Visit  Medication Sig Dispense Refill  . aspirin 81 MG chewable tablet Chew 81 mg by mouth daily.      Marland Kitchen atorvastatin (LIPITOR) 20 MG tablet TAKE ONE TABLET BY MOUTH ONCE DAILY AT  6PM 90 tablet 0  . atorvastatin (LIPITOR) 20 MG tablet TAKE ONE TABLET BY MOUTH ONCE DAILY AT  6  PM 90 tablet 2  . bisoprolol-hydrochlorothiazide (ZIAC) 10-6.25 MG tablet TAKE ONE TABLET BY MOUTH ONCE DAILY 90 tablet 3  . glimepiride (AMARYL) 2 MG tablet TAKE ONE TABLET BY MOUTH TWICE DAILY 180 tablet 3  . ibuprofen (ADVIL,MOTRIN) 600 MG tablet TAKE ONE TABLET BY MOUTH TWICE DAILY AS NEEDED FOR PAIN 180 tablet 0  . metFORMIN (GLUCOPHAGE) 1000 MG tablet TAKE ONE TABLET BY MOUTH TWICE DAILY WITH MEALS 180 tablet 3  . OSENI 25-30 MG TABS TAKE ONE TABLET BY MOUTH ONCE DAILY 90 tablet 2  . tadalafil (CIALIS) 20 MG tablet Take 1 tablet (20 mg total) by mouth as directed. Every 3 days as needed 30 tablet 5  . traMADol (ULTRAM) 50 MG tablet as needed.  0   No facility-administered medications prior to visit.     ROS Review of Systems  Constitutional: Negative for appetite change, fatigue and unexpected weight change.  HENT: Negative for congestion, nosebleeds, sneezing, sore throat and trouble swallowing.   Eyes: Negative for itching and visual disturbance.  Respiratory: Negative for cough.   Cardiovascular: Negative for chest pain, palpitations and leg swelling.  Gastrointestinal: Negative for abdominal distention, blood in stool, diarrhea and nausea.  Genitourinary: Negative for frequency and hematuria.  Musculoskeletal: Negative for back pain, gait problem, joint swelling and neck pain.  Skin: Negative for rash.  Neurological: Negative for dizziness, tremors, speech difficulty and weakness.    Psychiatric/Behavioral: Negative for agitation, dysphoric mood, sleep disturbance and suicidal ideas. The patient is not nervous/anxious.     Objective:  BP 132/74 (BP Location: Left Arm, Patient Position: Sitting, Cuff Size: Large)   Pulse 63   Temp 98.5 F (36.9 C) (Oral)   Ht 5\' 9"  (1.753 m)   Wt 262 lb 1.3 oz (118.9 kg)   SpO2 99%   BMI 38.70 kg/m   BP Readings from Last 3 Encounters:  11/29/16 132/74  08/02/16 120/70  04/01/16 120/82    Wt Readings from Last 3 Encounters:  11/29/16 262 lb 1.3 oz (118.9 kg)  08/02/16 258 lb (117 kg)  04/01/16 258 lb (117 kg)    Physical Exam  Constitutional: He is oriented to person, place, and time. He appears well-developed. No distress.  NAD  HENT:  Mouth/Throat: Oropharynx is clear and moist.  Eyes: Conjunctivae are normal. Pupils are equal, round, and reactive to light.  Neck: Normal range of motion. No JVD present. No thyromegaly present.  Cardiovascular: Normal rate, regular rhythm, normal heart sounds and intact distal pulses.  Exam reveals no gallop and no friction rub.   No murmur heard. Pulmonary/Chest: Effort normal and breath sounds normal. No respiratory distress. He has no wheezes. He has no rales. He exhibits no tenderness.  Abdominal: Soft. Bowel sounds are normal. He exhibits no distension and no mass. There is no tenderness. There is no rebound and no guarding.  Genitourinary: Rectum normal and prostate  normal. Rectal exam shows guaiac negative stool.  Musculoskeletal: Normal range of motion. He exhibits no edema or tenderness.  Lymphadenopathy:    He has no cervical adenopathy.  Neurological: He is alert and oriented to person, place, and time. He has normal reflexes. No cranial nerve deficit. He exhibits normal muscle tone. He displays a negative Romberg sign. Coordination and gait normal.  Skin: Skin is warm and dry. No rash noted.  Psychiatric: He has a normal mood and affect. His behavior is normal. Judgment  and thought content normal.    Lab Results  Component Value Date   WBC 6.6 11/24/2016   HGB 15.0 11/24/2016   HCT 43.9 11/24/2016   PLT 167.0 11/24/2016   GLUCOSE 185 (H) 11/24/2016   CHOL 154 11/24/2016   TRIG 194.0 (H) 11/24/2016   HDL 33.90 (L) 11/24/2016   LDLDIRECT 56.3 11/20/2012   LDLCALC 81 11/24/2016   ALT 26 11/24/2016   AST 38 (H) 11/24/2016   NA 141 11/24/2016   K 4.0 11/24/2016   CL 103 11/24/2016   CREATININE 0.89 11/24/2016   BUN 12 11/24/2016   CO2 28 11/24/2016   TSH 2.66 11/24/2016   PSA 0.57 11/24/2016   HGBA1C 7.1 (H) 11/24/2016   MICROALBUR 0.7 11/24/2016    No results found.  Assessment & Plan:   There are no diagnoses linked to this encounter. I am having Mr. Lazenby maintain his aspirin, traMADol, ibuprofen, atorvastatin, metFORMIN, glimepiride, tadalafil, bisoprolol-hydrochlorothiazide, OSENI, and atorvastatin.  No orders of the defined types were placed in this encounter.    Follow-up: No Follow-up on file.  Walker Kehr, MD

## 2017-03-23 ENCOUNTER — Other Ambulatory Visit: Payer: Self-pay | Admitting: Internal Medicine

## 2017-03-28 ENCOUNTER — Other Ambulatory Visit (INDEPENDENT_AMBULATORY_CARE_PROVIDER_SITE_OTHER): Payer: BLUE CROSS/BLUE SHIELD

## 2017-03-28 DIAGNOSIS — E1165 Type 2 diabetes mellitus with hyperglycemia: Secondary | ICD-10-CM | POA: Diagnosis not present

## 2017-03-28 DIAGNOSIS — IMO0002 Reserved for concepts with insufficient information to code with codable children: Secondary | ICD-10-CM

## 2017-03-28 LAB — BASIC METABOLIC PANEL
BUN: 10 mg/dL (ref 6–23)
CALCIUM: 8.6 mg/dL (ref 8.4–10.5)
CO2: 29 mEq/L (ref 19–32)
Chloride: 102 mEq/L (ref 96–112)
Creatinine, Ser: 0.95 mg/dL (ref 0.40–1.50)
GFR: 86.56 mL/min (ref >60.00)
GLUCOSE: 180 mg/dL — AB (ref 70–99)
POTASSIUM: 3.7 meq/L (ref 3.5–5.1)
SODIUM: 139 meq/L (ref 135–145)

## 2017-03-28 LAB — HEMOGLOBIN A1C: HEMOGLOBIN A1C: 7 % — AB (ref 4.6–6.5)

## 2017-03-31 ENCOUNTER — Encounter: Payer: Self-pay | Admitting: Internal Medicine

## 2017-03-31 ENCOUNTER — Ambulatory Visit (INDEPENDENT_AMBULATORY_CARE_PROVIDER_SITE_OTHER): Payer: BLUE CROSS/BLUE SHIELD | Admitting: Internal Medicine

## 2017-03-31 VITALS — BP 130/78 | HR 52 | Temp 98.2°F | Ht 69.0 in | Wt 256.0 lb

## 2017-03-31 DIAGNOSIS — G8929 Other chronic pain: Secondary | ICD-10-CM

## 2017-03-31 DIAGNOSIS — Z0000 Encounter for general adult medical examination without abnormal findings: Secondary | ICD-10-CM | POA: Diagnosis not present

## 2017-03-31 DIAGNOSIS — Z6838 Body mass index (BMI) 38.0-38.9, adult: Secondary | ICD-10-CM

## 2017-03-31 DIAGNOSIS — E119 Type 2 diabetes mellitus without complications: Secondary | ICD-10-CM | POA: Diagnosis not present

## 2017-03-31 DIAGNOSIS — I10 Essential (primary) hypertension: Secondary | ICD-10-CM | POA: Diagnosis not present

## 2017-03-31 DIAGNOSIS — M5440 Lumbago with sciatica, unspecified side: Secondary | ICD-10-CM

## 2017-03-31 MED ORDER — IBUPROFEN 600 MG PO TABS: 600.0000 mg | ORAL_TABLET | Freq: Two times a day (BID) | ORAL | 0 refills | Status: DC | PRN

## 2017-03-31 NOTE — Assessment & Plan Note (Signed)
Amaryl, metformin, Alloglipt- Pioglitazone

## 2017-03-31 NOTE — Assessment & Plan Note (Signed)
Better  

## 2017-03-31 NOTE — Assessment & Plan Note (Signed)
On Bisoprolol HCT 

## 2017-03-31 NOTE — Assessment & Plan Note (Signed)
Ibuprofen prn 

## 2017-03-31 NOTE — Progress Notes (Signed)
Subjective:  Patient ID: Gary Fowler, male    DOB: 1961-01-23  Age: 56 y.o. MRN: [id number]  CC: No chief complaint on file.   HPI Gary Fowler presents for DM, HTN, OA f/u  Outpatient Medications Prior to Visit  Medication Sig Dispense Refill  . aspirin 81 MG chewable tablet Chew 81 mg by mouth daily.      Marland Kitchen atorvastatin (LIPITOR) 20 MG tablet TAKE ONE TABLET BY MOUTH ONCE DAILY AT  6PM 90 tablet 0  . atorvastatin (LIPITOR) 20 MG tablet TAKE ONE TABLET BY MOUTH ONCE DAILY AT  6  PM 90 tablet 2  . bisoprolol-hydrochlorothiazide (ZIAC) 10-6.25 MG tablet TAKE ONE TABLET BY MOUTH ONCE DAILY 90 tablet 3  . glimepiride (AMARYL) 2 MG tablet TAKE ONE TABLET BY MOUTH TWICE DAILY 180 tablet 3  . ibuprofen (ADVIL,MOTRIN) 600 MG tablet TAKE ONE TABLET BY MOUTH TWICE DAILY AS NEEDED FOR PAIN 180 tablet 0  . metFORMIN (GLUCOPHAGE) 1000 MG tablet TAKE ONE TABLET BY MOUTH TWICE DAILY WITH MEALS 180 tablet 3  . OSENI 25-30 MG TABS TAKE ONE TABLET BY MOUTH ONCE DAILY 90 tablet 2  . tadalafil (CIALIS) 20 MG tablet Take 1 tablet (20 mg total) by mouth as directed. Every 3 days as needed 30 tablet 5  . traMADol (ULTRAM) 50 MG tablet as needed.  0   No facility-administered medications prior to visit.     ROS Review of Systems  Constitutional: Negative for appetite change, fatigue and unexpected weight change.  HENT: Negative for congestion, nosebleeds, sneezing, sore throat and trouble swallowing.   Eyes: Negative for itching and visual disturbance.  Respiratory: Negative for cough.   Cardiovascular: Negative for chest pain, palpitations and leg swelling.  Gastrointestinal: Negative for abdominal distention, blood in stool, diarrhea and nausea.  Genitourinary: Negative for frequency and hematuria.  Musculoskeletal: Positive for arthralgias and back pain. Negative for gait problem, joint swelling and neck pain.  Skin: Negative for rash.  Neurological: Negative for dizziness, tremors,  speech difficulty and weakness.  Psychiatric/Behavioral: Negative for agitation, dysphoric mood and sleep disturbance. The patient is not nervous/anxious.     Objective:  BP 130/78 (BP Location: Left Arm, Patient Position: Sitting, Cuff Size: Large)   Pulse (!) 52   Temp 98.2 F (36.8 C) (Oral)   Ht 5\' 9"  (1.753 m)   Wt 256 lb (116.1 kg)   SpO2 98%   BMI 37.80 kg/m   BP Readings from Last 3 Encounters:  03/31/17 130/78  11/29/16 132/74  08/02/16 120/70    Wt Readings from Last 3 Encounters:  03/31/17 256 lb (116.1 kg)  11/29/16 262 lb 1.3 oz (118.9 kg)  08/02/16 258 lb (117 kg)    Physical Exam  Constitutional: He is oriented to person, place, and time. He appears well-developed. No distress.  NAD  HENT:  Mouth/Throat: Oropharynx is clear and moist.  Eyes: Pupils are equal, round, and reactive to light. Conjunctivae are normal.  Neck: Normal range of motion. No JVD present. No thyromegaly present.  Cardiovascular: Normal rate, regular rhythm, normal heart sounds and intact distal pulses.  Exam reveals no gallop and no friction rub.   No murmur heard. Pulmonary/Chest: Effort normal and breath sounds normal. No respiratory distress. He has no wheezes. He has no rales. He exhibits no tenderness.  Abdominal: Soft. Bowel sounds are normal. He exhibits no distension and no mass. There is no tenderness. There is no rebound and no guarding.  Musculoskeletal: Normal range  of motion. He exhibits no edema or tenderness.  Lymphadenopathy:    He has no cervical adenopathy.  Neurological: He is alert and oriented to person, place, and time. He has normal reflexes. No cranial nerve deficit. He exhibits normal muscle tone. He displays a negative Romberg sign. Coordination and gait normal.  Skin: Skin is warm and dry. No rash noted.  Psychiatric: He has a normal mood and affect. His behavior is normal. Judgment and thought content normal.  obese  Lab Results  Component Value Date   WBC  6.6 11/24/2016   HGB 15.0 11/24/2016   HCT 43.9 11/24/2016   PLT 167.0 11/24/2016   GLUCOSE 180 (H) 03/28/2017   CHOL 154 11/24/2016   TRIG 194.0 (H) 11/24/2016   HDL 33.90 (L) 11/24/2016   LDLDIRECT 56.3 11/20/2012   LDLCALC 81 11/24/2016   ALT 26 11/24/2016   AST 38 (H) 11/24/2016   NA 139 03/28/2017   K 3.7 03/28/2017   CL 102 03/28/2017   CREATININE 0.95 03/28/2017   BUN 10 03/28/2017   CO2 29 03/28/2017   TSH 2.66 11/24/2016   PSA 0.57 11/24/2016   HGBA1C 7.0 (H) 03/28/2017   MICROALBUR 0.7 11/24/2016    No results found.  Assessment & Plan:   There are no diagnoses linked to this encounter. I am having Mr. Amedee maintain his aspirin, traMADol, ibuprofen, atorvastatin, glimepiride, tadalafil, bisoprolol-hydrochlorothiazide, OSENI, atorvastatin, and metFORMIN.  No orders of the defined types were placed in this encounter.    Follow-up: No Follow-up on file.  Walker Kehr, MD

## 2017-04-13 ENCOUNTER — Other Ambulatory Visit: Payer: Self-pay | Admitting: Internal Medicine

## 2017-05-23 ENCOUNTER — Other Ambulatory Visit: Payer: Self-pay | Admitting: Internal Medicine

## 2017-05-30 ENCOUNTER — Telehealth: Payer: Self-pay

## 2017-05-30 NOTE — Telephone Encounter (Signed)
Key: [id number]

## 2017-05-31 ENCOUNTER — Other Ambulatory Visit: Payer: Self-pay | Admitting: Internal Medicine

## 2017-05-31 ENCOUNTER — Telehealth: Payer: Self-pay | Admitting: Internal Medicine

## 2017-05-31 DIAGNOSIS — E118 Type 2 diabetes mellitus with unspecified complications: Secondary | ICD-10-CM

## 2017-05-31 MED ORDER — SITAGLIPTIN PHOSPHATE 100 MG PO TABS: 100.0000 mg | ORAL_TABLET | Freq: Every day | ORAL | 0 refills | Status: DC

## 2017-05-31 MED ORDER — PIOGLITAZONE HCL 30 MG PO TABS: 30.0000 mg | ORAL_TABLET | Freq: Every day | ORAL | 0 refills | Status: DC

## 2017-05-31 NOTE — Telephone Encounter (Signed)
Please advise 

## 2017-05-31 NOTE — Telephone Encounter (Signed)
I changed it for him

## 2017-05-31 NOTE — Telephone Encounter (Signed)
Pt came in stating he needs a PA for  OSENI 25-30 MG TABS  He has been out for a week, I checked with [facility] and we do not have samples of this medication, he would like to know if a close alternative can be called in while we wait for his PA to be approved and if there is not one we need to inform him and he will try to buy a few from the pharmacy to tide him over.  Please advise with [facility] in [place] absem[place] and call patient back

## 2017-06-02 NOTE — Telephone Encounter (Signed)
Pt.notified

## 2017-06-02 NOTE — Telephone Encounter (Signed)
LMTCB

## 2017-06-06 NOTE — Telephone Encounter (Signed)
PA was approved -  Can you inform patient of same please.

## 2017-06-06 NOTE — Telephone Encounter (Signed)
VM box is full unable to leave message.

## 2017-08-01 ENCOUNTER — Other Ambulatory Visit (INDEPENDENT_AMBULATORY_CARE_PROVIDER_SITE_OTHER): Payer: BLUE CROSS/BLUE SHIELD

## 2017-08-01 DIAGNOSIS — Z0000 Encounter for general adult medical examination without abnormal findings: Secondary | ICD-10-CM

## 2017-08-01 DIAGNOSIS — E119 Type 2 diabetes mellitus without complications: Secondary | ICD-10-CM | POA: Diagnosis not present

## 2017-08-01 LAB — TSH: TSH: 1.37 u[IU]/mL (ref 0.35–4.50)

## 2017-08-01 LAB — LIPID PANEL
CHOL/HDL RATIO: 5
Cholesterol: 163 mg/dL (ref 0–200)
HDL: 35.3 mg/dL — ABNORMAL LOW (ref >39.00)
LDL CALC: 96 mg/dL (ref 0–99)
NONHDL: 127.46
Triglycerides: 159 mg/dL — ABNORMAL HIGH (ref 0.0–149.0)
VLDL: 31.8 mg/dL (ref 0.0–40.0)

## 2017-08-01 LAB — URINALYSIS
Bilirubin Urine: NEGATIVE
HGB URINE DIPSTICK: NEGATIVE
KETONES UR: NEGATIVE
Leukocytes, UA: NEGATIVE
Nitrite: NEGATIVE
PH: 6 (ref 5.0–8.0)
Specific Gravity, Urine: >=1.03 — AB (ref 1.000–1.030)
URINE GLUCOSE: NEGATIVE
Urobilinogen, UA: 0.2 (ref 0.0–1.0)

## 2017-08-01 LAB — BASIC METABOLIC PANEL
BUN: 12 mg/dL (ref 6–23)
CHLORIDE: 103 meq/L (ref 96–112)
CO2: 28 meq/L (ref 19–32)
Calcium: 8.6 mg/dL (ref 8.4–10.5)
Creatinine, Ser: 0.84 mg/dL (ref 0.40–1.50)
GFR: 99.65 mL/min (ref >60.00)
Glucose, Bld: 173 mg/dL — ABNORMAL HIGH (ref 70–99)
POTASSIUM: 3.6 meq/L (ref 3.5–5.1)
SODIUM: 141 meq/L (ref 135–145)

## 2017-08-01 LAB — HEMOGLOBIN A1C: HEMOGLOBIN A1C: 7.8 % — AB (ref 4.6–6.5)

## 2017-08-01 LAB — HEPATIC FUNCTION PANEL
ALT: 27 U/L (ref 0–53)
AST: 38 U/L — ABNORMAL HIGH (ref 0–37)
Albumin: 4.1 g/dL (ref 3.5–5.2)
Alkaline Phosphatase: 58 U/L (ref 39–117)
BILIRUBIN DIRECT: 0.2 mg/dL (ref 0.0–0.3)
BILIRUBIN TOTAL: 0.9 mg/dL (ref 0.2–1.2)
Total Protein: 6.5 g/dL (ref 6.0–8.3)

## 2017-08-01 LAB — MICROALBUMIN / CREATININE URINE RATIO
CREATININE, U: 297.1 mg/dL
MICROALB/CREAT RATIO: 0.6 mg/g (ref 0.0–30.0)
Microalb, Ur: 1.7 mg/dL (ref 0.0–1.9)

## 2017-08-01 LAB — PSA: PSA: 0.7 ng/mL (ref 0.10–4.00)

## 2017-08-03 ENCOUNTER — Ambulatory Visit: Payer: BLUE CROSS/BLUE SHIELD | Admitting: Internal Medicine

## 2017-08-03 ENCOUNTER — Encounter: Payer: Self-pay | Admitting: Internal Medicine

## 2017-08-03 DIAGNOSIS — E118 Type 2 diabetes mellitus with unspecified complications: Secondary | ICD-10-CM | POA: Diagnosis not present

## 2017-08-03 DIAGNOSIS — I10 Essential (primary) hypertension: Secondary | ICD-10-CM | POA: Diagnosis not present

## 2017-08-03 DIAGNOSIS — D485 Neoplasm of uncertain behavior of skin: Secondary | ICD-10-CM

## 2017-08-03 DIAGNOSIS — E66812 Obesity, class 2: Secondary | ICD-10-CM

## 2017-08-03 DIAGNOSIS — Z6838 Body mass index (BMI) 38.0-38.9, adult: Secondary | ICD-10-CM | POA: Diagnosis not present

## 2017-08-03 MED ORDER — ALOGLIPTIN-PIOGLITAZONE 25-30 MG PO TABS: 1.0000 | ORAL_TABLET | Freq: Every day | ORAL | 3 refills | Status: DC

## 2017-08-03 NOTE — Patient Instructions (Signed)
Skin bx w/me 

## 2017-08-03 NOTE — Progress Notes (Signed)
Subjective:  Patient ID: Gary Fowler, male    DOB: [birth year]  Age: 56 y.o. MRN: [id number]  CC: No chief complaint on file.   HPI ALTUS ZAINO presents for HTN, DM, dyslipidemia f/u. Osemi was not covered - on Actos and Januvia ($200) - not taking...  Outpatient Medications Prior to Visit  Medication Sig Dispense Refill  . aspirin 81 MG chewable tablet Chew 81 mg by mouth daily.      Marland Kitchen atorvastatin (LIPITOR) 20 MG tablet TAKE ONE TABLET BY MOUTH ONCE DAILY AT  6PM 90 tablet 0  . atorvastatin (LIPITOR) 20 MG tablet TAKE ONE TABLET BY MOUTH ONCE DAILY AT  6  PM 90 tablet 2  . bisoprolol-hydrochlorothiazide (ZIAC) 10-6.25 MG tablet TAKE ONE TABLET BY MOUTH ONCE DAILY 90 tablet 3  . glimepiride (AMARYL) 2 MG tablet TAKE ONE TABLET BY MOUTH TWICE DAILY 180 tablet 3  . ibuprofen (ADVIL,MOTRIN) 600 MG tablet Take 1 tablet (600 mg total) by mouth 2 (two) times daily as needed. for pain 180 tablet 0  . metFORMIN (GLUCOPHAGE) 1000 MG tablet TAKE ONE TABLET BY MOUTH TWICE DAILY WITH MEALS 180 tablet 3  . pioglitazone (ACTOS) 30 MG tablet Take 1 tablet (30 mg total) by mouth daily. 90 tablet 0  . sitaGLIPtin (JANUVIA) 100 MG tablet Take 1 tablet (100 mg total) by mouth daily. 90 tablet 0  . tadalafil (CIALIS) 20 MG tablet Take 1 tablet (20 mg total) by mouth as directed. Every 3 days as needed 30 tablet 5  . traMADol (ULTRAM) 50 MG tablet as needed.  0   No facility-administered medications prior to visit.     ROS Review of Systems  Constitutional: Negative for appetite change, fatigue and unexpected weight change.  HENT: Negative for congestion, nosebleeds, sneezing, sore throat and trouble swallowing.   Eyes: Negative for itching and visual disturbance.  Respiratory: Negative for cough.   Cardiovascular: Negative for chest pain, palpitations and leg swelling.  Gastrointestinal: Negative for abdominal distention, blood in stool, diarrhea and nausea.  Genitourinary: Negative for  frequency and hematuria.  Musculoskeletal: Negative for back pain, gait problem, joint swelling and neck pain.  Skin: Negative for rash.  Neurological: Negative for dizziness, tremors, speech difficulty and weakness.  Psychiatric/Behavioral: Negative for agitation, dysphoric mood and sleep disturbance. The patient is not nervous/anxious.     Objective:  Pulse 61   Temp 98.1 F (36.7 C) (Oral)   Ht 5\' 9"  (1.753 m)   Wt 255 lb (115.7 kg)   SpO2 98%   BMI 37.66 kg/m   BP Readings from Last 3 Encounters:  03/31/17 130/78  11/29/16 132/74  08/02/16 120/70    Wt Readings from Last 3 Encounters:  08/03/17 255 lb (115.7 kg)  03/31/17 256 lb (116.1 kg)  11/29/16 262 lb 1.3 oz (118.9 kg)    Physical Exam  Constitutional: He is oriented to person, place, and time. He appears well-developed. No distress.  NAD  HENT:  Mouth/Throat: Oropharynx is clear and moist.  Eyes: Conjunctivae are normal. Pupils are equal, round, and reactive to light.  Neck: Normal range of motion. No JVD present. No thyromegaly present.  Cardiovascular: Normal rate, regular rhythm, normal heart sounds and intact distal pulses. Exam reveals no gallop and no friction rub.  No murmur heard. Pulmonary/Chest: Effort normal and breath sounds normal. No respiratory distress. He has no wheezes. He has no rales. He exhibits no tenderness.  Abdominal: Soft. Bowel sounds are normal. He exhibits no  distension and no mass. There is no tenderness. There is no rebound and no guarding.  Musculoskeletal: Normal range of motion. He exhibits no edema or tenderness.  Lymphadenopathy:    He has no cervical adenopathy.  Neurological: He is alert and oriented to person, place, and time. He has normal reflexes. No cranial nerve deficit. He exhibits normal muscle tone. He displays a negative Romberg sign. Coordination and gait normal.  Skin: Skin is warm and dry. No rash noted.  Psychiatric: He has a normal mood and affect. His  behavior is normal. Judgment and thought content normal.  neck w/a skin lesion  Lab Results  Component Value Date   WBC 6.6 11/24/2016   HGB 15.0 11/24/2016   HCT 43.9 11/24/2016   PLT 167.0 11/24/2016   GLUCOSE 173 (H) 08/01/2017   CHOL 163 08/01/2017   TRIG 159.0 (H) 08/01/2017   HDL 35.30 (L) 08/01/2017   LDLDIRECT 56.3 11/20/2012   LDLCALC 96 08/01/2017   ALT 27 08/01/2017   AST 38 (H) 08/01/2017   NA 141 08/01/2017   K 3.6 08/01/2017   CL 103 08/01/2017   CREATININE 0.84 08/01/2017   BUN 12 08/01/2017   CO2 28 08/01/2017   TSH 1.37 08/01/2017   PSA 0.70 08/01/2017   HGBA1C 7.8 (H) 08/01/2017   MICROALBUR 1.7 08/01/2017    No results found.  Assessment & Plan:   There are no diagnoses linked to this encounter. I am having Dewitte D. Shanahan maintain his aspirin, traMADol, atorvastatin, tadalafil, atorvastatin, metFORMIN, ibuprofen, glimepiride, bisoprolol-hydrochlorothiazide, pioglitazone, and sitaGLIPtin.  No orders of the defined types were placed in this encounter.    Follow-up: No Follow-up on file.  Walker Kehr, MD

## 2017-08-03 NOTE — Assessment & Plan Note (Signed)
Wt Readings from Last 3 Encounters:  08/03/17 255 lb (115.7 kg)  03/31/17 256 lb (116.1 kg)  11/29/16 262 lb 1.3 oz (118.9 kg)

## 2017-08-03 NOTE — Assessment & Plan Note (Addendum)
Amaryl, metformin, Alloglipt- Pioglitazone  Osemi was not covered - on Actos and Januvia ($200) - not taking.Marland KitchenMarland KitchenOseni is covetred

## 2017-08-03 NOTE — Assessment & Plan Note (Signed)
On Bisoprolol HCT

## 2017-08-03 NOTE — Assessment & Plan Note (Signed)
Neck base 2018 Skin bx w/me

## 2017-08-05 ENCOUNTER — Other Ambulatory Visit: Payer: Self-pay | Admitting: Internal Medicine

## 2017-08-17 ENCOUNTER — Encounter: Payer: Self-pay | Admitting: Internal Medicine

## 2017-08-17 ENCOUNTER — Other Ambulatory Visit: Payer: BLUE CROSS/BLUE SHIELD

## 2017-08-17 ENCOUNTER — Ambulatory Visit: Payer: BLUE CROSS/BLUE SHIELD | Admitting: Internal Medicine

## 2017-08-17 VITALS — BP 128/76 | HR 62 | Temp 98.2°F | Ht 69.0 in | Wt 254.0 lb

## 2017-08-17 DIAGNOSIS — E118 Type 2 diabetes mellitus with unspecified complications: Secondary | ICD-10-CM | POA: Diagnosis not present

## 2017-08-17 DIAGNOSIS — D485 Neoplasm of uncertain behavior of skin: Secondary | ICD-10-CM | POA: Diagnosis not present

## 2017-08-17 MED ORDER — ALOGLIPTIN-PIOGLITAZONE 25-30 MG PO TABS: 1.0000 | ORAL_TABLET | Freq: Every day | ORAL | 3 refills | Status: DC

## 2017-08-17 NOTE — Progress Notes (Signed)
Subjective:  Patient ID: Gary Fowler, male    DOB: 1960-11-30  Age: 57 y.o. MRN: [id number]  CC: No chief complaint on file.   HPI Gary Fowler presents for skin bx F/u elevated A1c  Outpatient Medications Prior to Visit  Medication Sig Dispense Refill  . aspirin 81 MG chewable tablet Chew 81 mg by mouth daily.      Marland Kitchen atorvastatin (LIPITOR) 20 MG tablet TAKE ONE TABLET BY MOUTH ONCE DAILY AT  6  PM 90 tablet 2  . bisoprolol-hydrochlorothiazide (ZIAC) 10-6.25 MG tablet TAKE ONE TABLET BY MOUTH ONCE DAILY 90 tablet 3  . glimepiride (AMARYL) 2 MG tablet TAKE ONE TABLET BY MOUTH TWICE DAILY 180 tablet 3  . ibuprofen (ADVIL,MOTRIN) 600 MG tablet Take 1 tablet (600 mg total) by mouth 2 (two) times daily as needed. for pain 180 tablet 0  . metFORMIN (GLUCOPHAGE) 1000 MG tablet TAKE ONE TABLET BY MOUTH TWICE DAILY WITH MEALS 180 tablet 3  . tadalafil (CIALIS) 20 MG tablet Take 1 tablet (20 mg total) by mouth as directed. Every 3 days as needed 30 tablet 5  . traMADol (ULTRAM) 50 MG tablet as needed.  0   No facility-administered medications prior to visit.     ROS Review of Systems  Constitutional: Negative for fatigue.  Musculoskeletal: Negative for arthralgias.  Neurological: Negative for light-headedness.    Objective:  BP 128/76 (BP Location: Right Arm, Patient Position: Sitting, Cuff Size: Large)   Pulse 62   Temp 98.2 F (36.8 C) (Oral)   Ht 5\' 9"  (1.753 m)   Wt 254 lb (115.2 kg)   SpO2 98%   BMI 37.51 kg/m   BP Readings from Last 3 Encounters:  08/17/17 128/76  03/31/17 130/78  11/29/16 132/74    Wt Readings from Last 3 Encounters:  08/17/17 254 lb (115.2 kg)  08/03/17 255 lb (115.7 kg)  03/31/17 256 lb (116.1 kg)    Physical Exam  Constitutional: No distress.  Musculoskeletal: He exhibits no edema.  Skin: There is erythema.  a nodule on the base of the neck on the left  measuring 12x9    mm   Procedure Note :     Procedure :  Skin biopsy   Indication:  Changing mole (s ),  Suspicious lesion(s)   Risks including unsuccessful procedure , bleeding, infection, bruising, scar, a need for another complete procedure and others were explained to the patient in detail as well as the benefits. Informed consent was obtained.  The patient was placed in a decubitus position.  Lesion #1 on the base of the neck on the left  measuring 12x9    mm   Skin over lesion #1  was marked and prepped with Betadine and alcohol  and anesthetized with 2 cc of 2% lidocaine and epinephrine, using a 25-gauge 1 inch needle.  Shave biopsy with a sterile Dermablade was carried out in the usual fashion. Hyfrecator was used to destroy the rest of the lesion potentially left behind w/in the outlined margins and for hemostasis. Band-Aid was applied with antibiotic ointment.      Postprocedure instructions :    A Band-Aid should be  changed twice daily. You can take a shower tomorrow.  Keep the wounds clean. You can wash them with liquid soap and water. Pat dry with gauze or a Kleenex tissue  Before applying antibiotic ointment and a Band-Aid.   You need to report immediately  if fever, chills or any signs  of infection develop.    The biopsy results should be available in 1 -2 weeks.    Lab Results  Component Value Date   WBC 6.6 11/24/2016   HGB 15.0 11/24/2016   HCT 43.9 11/24/2016   PLT 167.0 11/24/2016   GLUCOSE 173 (H) 08/01/2017   CHOL 163 08/01/2017   TRIG 159.0 (H) 08/01/2017   HDL 35.30 (L) 08/01/2017   LDLDIRECT 56.3 11/20/2012   LDLCALC 96 08/01/2017   ALT 27 08/01/2017   AST 38 (H) 08/01/2017   NA 141 08/01/2017   K 3.6 08/01/2017   CL 103 08/01/2017   CREATININE 0.84 08/01/2017   BUN 12 08/01/2017   CO2 28 08/01/2017   TSH 1.37 08/01/2017   PSA 0.70 08/01/2017   HGBA1C 7.8 (H) 08/01/2017   MICROALBUR 1.7 08/01/2017    No results found.  Assessment & Plan:   Diagnoses and all orders for this visit:  Neoplasm of uncertain  behavior of skin -     Dermatology pathology; Future   I have discontinued Ladamien D. Courtney's traMADol. I am also having him maintain his aspirin, tadalafil, atorvastatin, metFORMIN, ibuprofen, glimepiride, and bisoprolol-hydrochlorothiazide.  No orders of the defined types were placed in this encounter.    Follow-up: No Follow-up on file.  Walker Kehr, MD

## 2017-08-17 NOTE — Assessment & Plan Note (Signed)
He is re-starting Oseni

## 2017-08-17 NOTE — Assessment & Plan Note (Signed)
See procedure 

## 2017-08-17 NOTE — Patient Instructions (Signed)
Postprocedure instructions :    A Band-Aid should be  changed twice daily. You can take a shower tomorrow.  Keep the wounds clean. You can wash them with liquid soap and water. Pat dry with gauze or a Kleenex tissue  Before applying antibiotic ointment and a Band-Aid.   You need to report immediately  if fever, chills or any signs of infection develop.    The biopsy results should be available in 1 -2 weeks. 

## 2017-08-24 ENCOUNTER — Telehealth: Payer: Self-pay | Admitting: Internal Medicine

## 2017-08-24 DIAGNOSIS — C4490 Unspecified malignant neoplasm of skin, unspecified: Secondary | ICD-10-CM

## 2017-08-24 NOTE — Telephone Encounter (Signed)
Copied from [place] [id number]. Topic: Quick Communication - Lab Results >> Aug 24, 2017  2:24 PM Darl Householder, RMA wrote: Patient is requesting a call back concerning lab results

## 2017-08-25 NOTE — Telephone Encounter (Signed)
Please advise about skin bx results

## 2017-08-25 NOTE — Telephone Encounter (Signed)
Pt notified and currently goes to [facility] dermatology and would like to know if he can go there?

## 2017-08-25 NOTE — Telephone Encounter (Signed)
It was a skin cancer We'll mail a report Is it ok w/pt for me to ref to a [place]? Thx

## 2017-08-26 NOTE — Telephone Encounter (Signed)
Yes, I'll ref Thx

## 2017-09-05 ENCOUNTER — Telehealth: Payer: Self-pay | Admitting: Internal Medicine

## 2017-09-05 NOTE — Telephone Encounter (Signed)
Copied from [place]. Topic: Quick Communication - See Telephone Encounter >> Sep 05, 2017  2:58 PM Aurelio Brash B wrote: CRM for notification. See Telephone encounter for:   Pt is asking Dr Eilleen Kempf to change his medication as Alogliptin-Pioglitazone (OSENI) 25-30 MG TABS is not affordable.   He is requesting an alternate med be sent to [place], [place] [phone] (Phone) [phone] (Fax)

## 2017-09-06 NOTE — Telephone Encounter (Signed)
Ok Change to

## 2017-09-07 NOTE — Telephone Encounter (Signed)
Per note below please f/u with pt.

## 2017-09-08 MED ORDER — EMPAGLIFLOZIN-LINAGLIPTIN 10-5 MG PO TABS: 1.0000 | ORAL_TABLET | Freq: Every day | ORAL | 11 refills | Status: DC

## 2017-09-08 MED ORDER — PIOGLITAZONE HCL 30 MG PO TABS: 30.0000 mg | ORAL_TABLET | Freq: Every day | ORAL | 11 refills | Status: DC

## 2017-09-08 NOTE — Telephone Encounter (Signed)
Pls pick up samples and Rx for Glyxambi 1 a day and Pioglitazone 1 a day instead of the Oseni  Thx

## 2017-09-08 NOTE — Telephone Encounter (Signed)
Tried calling pt no answer couldn't leave msg due to vm being full! Will try again later...Gary Fowler

## 2017-10-20 ENCOUNTER — Ambulatory Visit: Payer: BLUE CROSS/BLUE SHIELD | Admitting: Family Medicine

## 2017-10-20 ENCOUNTER — Encounter: Payer: Self-pay | Admitting: Family Medicine

## 2017-10-20 VITALS — BP 124/84 | HR 60 | Temp 97.8°F | Wt 256.0 lb

## 2017-10-20 DIAGNOSIS — L03011 Cellulitis of right finger: Secondary | ICD-10-CM

## 2017-10-20 DIAGNOSIS — L03113 Cellulitis of right upper limb: Secondary | ICD-10-CM

## 2017-10-20 MED ORDER — SULFAMETHOXAZOLE-TRIMETHOPRIM 800-160 MG PO TABS: 1.0000 | ORAL_TABLET | Freq: Two times a day (BID) | ORAL | 0 refills | Status: AC

## 2017-10-20 NOTE — Progress Notes (Signed)
Subjective:    Patient ID: Gary Fowler, male    DOB: May 11, 1961, 57 y.o.   MRN: [id number]  No chief complaint on file.   HPI Pt typically seen at [facility] clinic however they did not have availability to see him for an urgent visit.  Pt was seen today for acute concern.  Pt states he woke up Monday morning and noticed a pimple on his right forearm.  Pt states the area is itchy and red.  Pt endorses the redness spreading.  Pt is unsure if he was bit by a bug, but does not recall any incident.    Pt also states that he injured his right fourth digit while doing some work around the house.  Pt states he was able to express pus from the side of his nail this morning.  Of note pt works in a NH.  States Tdap is up-to-date Past Medical History:  Diagnosis Date  . ED (erectile dysfunction)   . Family history of prostate cancer   . Hyperlipidemia   . Hypertension   . Insomnia   . LBP (low back pain)   . Mood swings   . OSA (obstructive sleep apnea)   . Skin cancer of nose 2008   Left  . Type II or unspecified type diabetes mellitus without mention of complication, not stated as uncontrolled     Allergies  Allergen Reactions  . Morphine Sulfate     REACTION: headache    ROS General: Denies fever, chills, night sweats, changes in weight, changes in appetite HEENT: Denies headaches, ear pain, changes in vision, rhinorrhea, sore throat CV: Denies CP, palpitations, SOB, orthopnea Pulm: Denies SOB, cough, wheezing GI: Denies abdominal pain, nausea, vomiting, diarrhea, constipation GU: Denies dysuria, hematuria, frequency, vaginal discharge Msk: Denies muscle cramps, joint pains Neuro: Denies weakness, numbness, tingling Skin: Denies rashes, bruising  +possible bug bite on R forearm with erythema and streaking, R finger infection Psych: Denies depression, anxiety, hallucinations     Objective:    Blood pressure 124/84, pulse 60, temperature 97.8 F (36.6 C), temperature source  Oral, weight 256 lb (116.1 kg), SpO2 99 %.   Gen. Pleasant, well-nourished, in no distress, normal affect  HEENT: [place]/AT, face symmetric, no scleral icterus, PERRLA, nares patent without drainage Cardiovascular: RRR, no peripheral edema Neuro:  A&Ox3, CN II-XII intact, normal gait Skin:  Warm, dry.  R 4th digit with erythema surrounding medial side of nail.  R forearm with pustule surrounded by erythema, induration, and streaking medially.      Wt Readings from Last 3 Encounters:  10/20/17 256 lb (116.1 kg)  08/17/17 254 lb (115.2 kg)  08/03/17 255 lb (115.7 kg)    Lab Results  Component Value Date   WBC 6.6 11/24/2016   HGB 15.0 11/24/2016   HCT 43.9 11/24/2016   PLT 167.0 11/24/2016   GLUCOSE 173 (H) 08/01/2017   CHOL 163 08/01/2017   TRIG 159.0 (H) 08/01/2017   HDL 35.30 (L) 08/01/2017   LDLDIRECT 56.3 11/20/2012   LDLCALC 96 08/01/2017   ALT 27 08/01/2017   AST 38 (H) 08/01/2017   NA 141 08/01/2017   K 3.6 08/01/2017   CL 103 08/01/2017   CREATININE 0.84 08/01/2017   BUN 12 08/01/2017   CO2 28 08/01/2017   TSH 1.37 08/01/2017   PSA 0.70 08/01/2017   HGBA1C 7.8 (H) 08/01/2017   MICROALBUR 1.7 08/01/2017    Assessment/Plan:  Cellulitis of right upper extremity  -Likely secondary to staph infection  versus insect bite -Start Bactrim twice daily -given handout -advised not to scratch the area. -Patient given RTC or ED precautions including fever, chills, nausea, vomiting, increased erythema - Plan: sulfamethoxazole-trimethoprim (BACTRIM DS,SEPTRA DS) 800-160 MG tablet  Paronychia of finger, right  -given handout - Plan: sulfamethoxazole-trimethoprim (BACTRIM DS,SEPTRA DS) 800-160 MG tablet -given RTC or ED precautions.   F/u prn with pcp  Grier Mitts, MD

## 2017-10-20 NOTE — Patient Instructions (Addendum)
Cellulitis, Adult Cellulitis is a skin infection. The infected area is usually red and tender. This condition occurs most often in the arms and lower legs. The infection can travel to the muscles, blood, and underlying tissue and become serious. It is very important to get treated for this condition. What are the causes? Cellulitis is caused by bacteria. The bacteria enter through a break in the skin, such as a cut, burn, insect bite, open sore, or crack. What increases the risk? This condition is more likely to occur in people who:  Have a weak defense system (immune system).  Have open wounds on the skin such as cuts, burns, bites, and scrapes. Bacteria can enter the body through these open wounds.  Are older.  Have diabetes.  Have a type of long-lasting (chronic) liver disease (cirrhosis) or kidney disease.  Use IV drugs.  What are the signs or symptoms? Symptoms of this condition include:  Redness, streaking, or spotting on the skin.  Swollen area of the skin.  Tenderness or pain when an area of the skin is touched.  Warm skin.  Fever.  Chills.  Blisters.  How is this diagnosed? This condition is diagnosed based on a medical history and physical exam. You may also have tests, including:  Blood tests.  Lab tests.  Imaging tests.  How is this treated? Treatment for this condition may include:  Medicines, such as antibiotic medicines or antihistamines.  Supportive care, such as rest and application of cold or warm cloths (cold or warm compresses) to the skin.  Hospital care, if the condition is severe.  The infection usually gets better within 1-2 days of treatment. Follow these instructions at home:  Take over-the-counter and prescription medicines only as told by your health care provider.  If you were prescribed an antibiotic medicine, take it as told by your health care provider. Do not stop taking the antibiotic even if you start to feel  better.  Drink enough fluid to keep your urine clear or pale yellow.  Do not touch or rub the infected area.  Raise (elevate) the infected area above the level of your heart while you are sitting or lying down.  Apply warm or cold compresses to the affected area as told by your health care provider.  Keep all follow-up visits as told by your health care provider. This is important. These visits let your health care provider make sure a more serious infection is not developing. Contact a health care provider if:  You have a fever.  Your symptoms do not improve within 1-2 days of starting treatment.  Your bone or joint underneath the infected area becomes painful after the skin has healed.  Your infection returns in the same area or another area.  You notice a swollen bump in the infected area.  You develop new symptoms.  You have a general ill feeling (malaise) with muscle aches and pains. Get help right away if:  Your symptoms get worse.  You feel very sleepy.  You develop vomiting or diarrhea that persists.  You notice red streaks coming from the infected area.  Your red area gets larger or turns dark in color. This information is not intended to replace advice given to you by your health care provider. Make sure you discuss any questions you have with your health care provider. Document Released: 05/12/2005 Document Revised: 12/11/2015 Document Reviewed: 06/11/2015 Elsevier Interactive Patient Education  2018 [place] is an infection of the skin that surrounds  a nail. It usually affects the skin around a fingernail, but it may also occur near a toenail. It often causes pain and swelling around the nail. This condition may come on suddenly or develop over a longer period. In some cases, a collection of pus (abscess) can form near or under the nail. Usually, paronychia is not serious and it clears up with treatment. What are the causes? This  condition may be caused by bacteria or fungi. It is commonly caused by either Streptococcus or Staphylococcus bacteria. The bacteria or fungi often cause the infection by getting into the affected area through an opening in the skin, such as a cut or a hangnail. What increases the risk? This condition is more likely to develop in:  People who get their hands wet often, such as those who work as Designer, industrial/product, bartenders, or nurses.  People who bite their fingernails or suck their thumbs.  People who trim their nails too short.  People who have hangnails or injured fingertips.  People who get manicures.  People who have diabetes.  What are the signs or symptoms? Symptoms of this condition include:  Redness and swelling of the skin near the nail.  Tenderness around the nail when you touch the area.  Pus-filled bumps under the cuticle. The cuticle is the skin at the base or sides of the nail.  Fluid or pus under the nail.  Throbbing pain in the area.  How is this diagnosed? This condition is usually diagnosed with a physical exam. In some cases, a sample of pus may be taken from an abscess to be tested in a lab. This can help to determine what type of bacteria or fungi is causing the condition. How is this treated? Treatment for this condition depends on the cause and severity of the condition. If the condition is mild, it may clear up on its own in a few days. Your health care provider may recommend soaking the affected area in warm water a few times a day. When treatment is needed, the options may include:  Antibiotic medicine, if the condition is caused by a bacterial infection.  Antifungal medicine, if the condition is caused by a fungal infection.  Incision and drainage, if an abscess is present. In this procedure, the health care provider will cut open the abscess so the pus can drain out.  Follow these instructions at home:  Soak the affected area in warm water if directed  to do so by your health care provider. You may be told to do this for 20 minutes, 2-3 times a day. Keep the area dry in between soakings.  Take medicines only as directed by your health care provider.  If you were prescribed an antibiotic medicine, finish all of it even if you start to feel better.  Keep the affected area clean.  Do not try to drain a fluid-filled bump yourself.  If you will be washing dishes or performing other tasks that require your hands to get wet, wear rubber gloves. You should also wear gloves if your hands might come in contact with irritating substances, such as cleaners or chemicals.  Follow your health care provider's instructions about: ? Wound care. ? Bandage (dressing) changes and removal. Contact a health care provider if:  Your symptoms get worse or do not improve with treatment.  You have a fever or chills.  You have redness spreading from the affected area.  You have continued or increased fluid, blood, or pus coming from the  affected area.  Your finger or knuckle becomes swollen or is difficult to move. This information is not intended to replace advice given to you by your health care provider. Make sure you discuss any questions you have with your health care provider. Document Released: 01/26/2001 Document Revised: 01/08/2016 Document Reviewed: 07/10/2014 Elsevier Interactive Patient Education  [employer].

## 2017-10-27 ENCOUNTER — Encounter: Payer: Self-pay | Admitting: *Deleted

## 2017-10-27 ENCOUNTER — Ambulatory Visit: Payer: BLUE CROSS/BLUE SHIELD | Admitting: Family Medicine

## 2017-10-27 ENCOUNTER — Encounter: Payer: Self-pay | Admitting: Family Medicine

## 2017-10-27 VITALS — BP 138/70 | HR 66 | Temp 98.3°F | Ht 69.0 in | Wt 255.0 lb

## 2017-10-27 DIAGNOSIS — L0291 Cutaneous abscess, unspecified: Secondary | ICD-10-CM

## 2017-10-27 DIAGNOSIS — L03113 Cellulitis of right upper limb: Secondary | ICD-10-CM

## 2017-10-27 MED ORDER — DOXYCYCLINE HYCLATE 100 MG PO TABS: 100.0000 mg | ORAL_TABLET | Freq: Two times a day (BID) | ORAL | 0 refills | Status: DC

## 2017-10-27 NOTE — Progress Notes (Signed)
Subjective:    Patient ID: Gary Fowler, male    DOB: 02/14/1961, 57 y.o.   MRN: [id number]  Chief Complaint  Patient presents with  . Follow-up  Pt is accompanied by his wife.  HPI Patient was seen today for follow-up on right arm cellulitis and right fourth digit paronychia.  Pt initially attributed this to an insect bite.  Since last OFV,pt has been taking Bactrim DS BID x7 days.  Pt notes improvement in his finger.  He also notes cessation of pain in his R elbow and wrist..  Pt states the area on his right forearm became tender even when water from the shower touched it.  He also noted increased sanguinous and purulent drainage from the area.  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  Pt denies HA, fever, chills, nausea, vomiting, increased pain in right upper extremity.  Tdap is up to date.  Past Medical History:  Diagnosis Date  . ED (erectile dysfunction)   . Family history of prostate cancer   . Hyperlipidemia   . Hypertension   . Insomnia   . LBP (low back pain)   . Mood swings   . OSA (obstructive sleep apnea)   . Skin cancer of nose 2008   Left  . Type II or unspecified type diabetes mellitus without mention of complication, not stated as uncontrolled     Allergies  Allergen Reactions  . Morphine Sulfate     REACTION: headache    ROS General: Denies fever, chills, night sweats, changes in weight, changes in appetite HEENT: Denies headaches, ear pain, changes in vision, rhinorrhea, sore throat CV: Denies CP, palpitations, SOB, orthopnea Pulm: Denies SOB, cough, wheezing GI: Denies abdominal pain, nausea, vomiting, diarrhea, constipation GU: Denies dysuria, hematuria, frequency, vaginal discharge Msk: Denies muscle cramps, joint pains Neuro: Denies weakness, numbness, tingling Skin: Denies rashes, bruising   +infection of forearm Psych: Denies depression, anxiety, hallucinations     Objective:    Blood pressure 138/70, pulse 66,  temperature 98.3 F (36.8 C), temperature source Oral, height 5\' 9"  (1.753 m), weight 255 lb (115.7 kg), SpO2 97 %.   Gen. Pleasant, well-nourished, in no distress, normal affect   HEENT: [place]/AT, face symmetric, conjunctiva clear, no scleral icterus, PERRLA, EOMI, nares patent without drainage Lungs: no accessory muscle use, CTAB, no wheezes or rales Cardiovascular: RRR, no peripheral edema Musculoskeletal: No deformities, no cyanosis or clubbing, normal tone Neuro:  A&Ox3, CN II-XII intact, normal gait Skin:  Warm, R forearm with 2.5 cm area of macerated skin with sanguinous and purulent drainage. Surrounding induration also present.  No streaking or increased erythema to area. moderately TTP.  See picture below.   Previous paronychia on r fourth digit resolved.       Wt Readings from Last 3 Encounters:  10/27/17 255 lb (115.7 kg)  10/20/17 256 lb (116.1 kg)  08/17/17 254 lb (115.2 kg)    Lab Results  Component Value Date   WBC 6.6 11/24/2016   HGB 15.0 11/24/2016   HCT 43.9 11/24/2016   PLT 167.0 11/24/2016   GLUCOSE 173 (H) 08/01/2017   CHOL 163 08/01/2017   TRIG 159.0 (H) 08/01/2017   HDL 35.30 (L) 08/01/2017   LDLDIRECT 56.3 11/20/2012   LDLCALC 96 08/01/2017   ALT 27 08/01/2017   AST 38 (H) 08/01/2017   NA 141 08/01/2017   K 3.6 08/01/2017   CL 103 08/01/2017   CREATININE 0.84 08/01/2017   BUN 12  08/01/2017   CO2 28 08/01/2017   TSH 1.37 08/01/2017   PSA 0.70 08/01/2017   HGBA1C 7.8 (H) 08/01/2017   MICROALBUR 1.7 08/01/2017    Assessment/Plan:  Abscess  -R forearm I&D'd and packing placed this visit.  See procedure note. -completed Bactrim DS BID x 7 days. -Will start doxy. -pt to be re-evaluated in clinic tomorrow.  Will decided at this time if imaging is needed or surgical eval for further debridement.. -Pt also advised if infection continues may need IV abxs.   - Plan: doxycycline (VIBRA-TABS) 100 MG tablet -Pt given ED precautions.  Cellulitis of  right upper extremity  - Plan: doxycycline (VIBRA-TABS) 100 MG tablet  F/u in clinic tomorrow.  Grier Mitts, MD

## 2017-10-27 NOTE — Progress Notes (Signed)
Incision and Drainage Procedure Note  Indications: Abscess with induration  Anesthesia: 1% lidocaine with epinephrine  Procedure Details  The procedure, risks and complications have been discussed in detail (including, but not limited to airway compromise, infection, bleeding) with the patient, and the patient has signed consent to the procedure.  The skin was sterilely prepped and draped over the affected area in the usual fashion. After adequate local anesthesia, I&D with a #11 blade was performed on the right forearm. Purulent drainage: present. Clumpy, purulent debris also removed from the incision.  ~9 cm sterile packing put in place. Wound covered with tefla dressing.    The patient was observed until stable.   EBL: minimal  Condition: Tolerated procedure well and Stable   Complications: none.    Grier Mitts, MD

## 2017-10-28 ENCOUNTER — Ambulatory Visit (INDEPENDENT_AMBULATORY_CARE_PROVIDER_SITE_OTHER): Payer: BLUE CROSS/BLUE SHIELD | Admitting: Family Medicine

## 2017-10-28 ENCOUNTER — Encounter: Payer: Self-pay | Admitting: Family Medicine

## 2017-10-28 VITALS — BP 120/80 | HR 70 | Temp 98.0°F | Wt 255.2 lb

## 2017-10-28 DIAGNOSIS — L02413 Cutaneous abscess of right upper limb: Secondary | ICD-10-CM

## 2017-10-28 NOTE — Patient Instructions (Signed)
Elevate arm frequently over the weekend Continue with the Doxycycline Consider heating pad to right forearm few times daily Follow up in 3 days for packing removal.

## 2017-10-28 NOTE — Progress Notes (Signed)
Subjective:     Patient ID: Gary Fowler, male   DOB: 04/06/1961, 57 y.o.   MRN: [id number]  HPI  Patient seen for follow-up regarding abscess and cellulitis of right forearm. He is actually followed at [facility]. He was seen here on the seventh by Dr. Volanda Fowler. He reported earlier that week appearance of what looked like a small "pimple ". He was initially concerned this may be some sort of bug bite. He also had small paronychia right fourth finger. He was placed on Septra DS. He had some wrist and elbow pain at that time and did seem to improve after starting Septra.  He was then seen back here yesterday again by Dr Gary Fowler with some increased redness and persistent swelling mid forearm and incision and drainage was performed. He was switched over that point to doxycycline 100 mg twice daily and had 2 doses thus far. Denies any fevers or chills. No known history of MRSA. Overall, feels some better with less tenderness.  Wound was packed yesterday with gauze  Past Medical History:  Diagnosis Date  . ED (erectile dysfunction)   . Family history of prostate cancer   . Hyperlipidemia   . Hypertension   . Insomnia   . LBP (low back pain)   . Mood swings   . OSA (obstructive sleep apnea)   . Skin cancer of nose 2008   Left  . Type II or unspecified type diabetes mellitus without mention of complication, not stated as uncontrolled    Past Surgical History:  Procedure Laterality Date  . arm fracture     with plate  . ARTHROSCOPY KNEE W/ DRILLING     rt. knee  . bone spur removal     lt. shoulder  . LUMBAR FUSION    . LUMBAR LAMINECTOMY    . SKIN CANCER EXCISION     face    reports that he quit smoking about 28 years ago. His smokeless tobacco use includes chew. He reports that he does not drink alcohol or use drugs. family history includes Cancer in his father; Crohn's disease in his paternal uncle; Diabetes in his maternal grandfather and paternal grandfather; Hypertension in his  mother. Allergies  Allergen Reactions  . Morphine Sulfate     REACTION: headache    Review of Systems  Constitutional: Negative for chills and fever.       Objective:   Physical Exam  Constitutional: He appears well-developed and well-nourished.  Cardiovascular: Normal rate and regular rhythm.  Skin:  Right forearm wound is inspected. He has packing in place. Only minimal surrounding erythema about 1 cm zone. He has some surrounding induration. Packing is removed. There is a minimal amount of purulence within the wound cavity. On applying some pressure superiorly there is little bit of purulence expressed into the wound cavity. We irrigated wound cavity with hydrogen peroxide. Re-packed wound cavity with 1/4 inch gauze.       Assessment:     Abscess and cellulitis right forearm. Status post incision and drainage yesterday. Patient slowly improving.    Plan:     -Wound repacked today as above -Elevate right forearm frequently over the weekend -Consider heating pad on low heat few times daily -Continue doxycycline 100 mg twice daily -Recommend reevaluation by Monday to reassess. If not continuing to improve that point consider orthopedic referral  [name] MD [facility]

## 2017-10-31 ENCOUNTER — Encounter: Payer: Self-pay | Admitting: Family Medicine

## 2017-10-31 ENCOUNTER — Ambulatory Visit (INDEPENDENT_AMBULATORY_CARE_PROVIDER_SITE_OTHER): Payer: BLUE CROSS/BLUE SHIELD | Admitting: Family Medicine

## 2017-10-31 VITALS — BP 132/70 | HR 62 | Temp 97.9°F

## 2017-10-31 DIAGNOSIS — L02413 Cutaneous abscess of right upper limb: Secondary | ICD-10-CM

## 2017-10-31 NOTE — Progress Notes (Signed)
Subjective:    Patient ID: Gary Fowler, male    DOB: 1961-06-20, 57 y.o.   MRN: [id number]  Chief Complaint  Patient presents with  . Wound Check    HPI Patient was seen today for follow-up on abscess of right forearm.  Initially seen on 3/7 started on Bactrim DS for paronychia of right fourth finger and a pimple of R foream thought to have been a bug bite by the pt. paronychia improved and there was little improvement of the right forearm while taking Bactrim DS.    Pt was seen again on 3/14 for increased redness and edema of the forearm.  Area was I&D and packed with sterile gauze, abx switched to Doxycycline.  Pt was seen again on 3/15 for repacking of wound.    Over the wknd, pt notes improvement in the area.  Pt has been keeping the area covered.  He notes less tenderness and drainage.  Past Medical History:  Diagnosis Date  . ED (erectile dysfunction)   . Family history of prostate cancer   . Hyperlipidemia   . Hypertension   . Insomnia   . LBP (low back pain)   . Mood swings   . OSA (obstructive sleep apnea)   . Skin cancer of nose 2008   Left  . Type II or unspecified type diabetes mellitus without mention of complication, not stated as uncontrolled     Allergies  Allergen Reactions  . Morphine Sulfate     REACTION: headache    ROS General: Denies fever, chills, night sweats, changes in weight, changes in appetite HEENT: Denies headaches, ear pain, changes in vision, rhinorrhea, sore throat CV: Denies CP, palpitations, SOB, orthopnea Pulm: Denies SOB, cough, wheezing GI: Denies abdominal pain, nausea, vomiting, diarrhea, constipation GU: Denies dysuria, hematuria, frequency, vaginal discharge Msk: Denies muscle cramps, joint pains   Neuro: Denies weakness, numbness, tingling Skin: Denies rashes, bruising  +R forearm abscess Psych: Denies depression, anxiety, hallucinations     Objective:    Blood pressure 132/70, pulse 62, temperature 97.9 F (36.6 C),  temperature source Oral, SpO2 95 %.   Gen. Pleasant, well-nourished, in no distress, normal affect   HEENT: [place]/AT, face symmetric, no scleral icterus, PERRLA, nares patent without drainage Cardiovascular: RRR, no m/r/g, no peripheral edema Skin:  Warm, dry.   No surrounding erythema noted.   Some surrounding induration proximal.  R forarm packing removed.  No purulent drainage noted with application of pressure surrounding the wound. Minimal sanguinous drainage noted.   Re-packed with 1/4 inch guaze.   Wt Readings from Last 3 Encounters:  10/28/17 255 lb 3.2 oz (115.8 kg)  10/27/17 255 lb (115.7 kg)  10/20/17 256 lb (116.1 kg)    Lab Results  Component Value Date   WBC 6.6 11/24/2016   HGB 15.0 11/24/2016   HCT 43.9 11/24/2016   PLT 167.0 11/24/2016   GLUCOSE 173 (H) 08/01/2017   CHOL 163 08/01/2017   TRIG 159.0 (H) 08/01/2017   HDL 35.30 (L) 08/01/2017   LDLDIRECT 56.3 11/20/2012   LDLCALC 96 08/01/2017   ALT 27 08/01/2017   AST 38 (H) 08/01/2017   NA 141 08/01/2017   K 3.6 08/01/2017   CL 103 08/01/2017   CREATININE 0.84 08/01/2017   BUN 12 08/01/2017   CO2 28 08/01/2017   TSH 1.37 08/01/2017   PSA 0.70 08/01/2017   HGBA1C 7.8 (H) 08/01/2017   MICROALBUR 1.7 08/01/2017    Assessment/Plan:  Abscess of right forearm  -improving  S/p I&D on 10/27/17. -wound repacked as above -continue Doxycycline 100 mg BID -advised to remove bandage while at home to let wound get some air -Pt to RTC on Thursday for reevaluation and possible repacking.  Grier Mitts, MD

## 2017-11-03 ENCOUNTER — Ambulatory Visit (INDEPENDENT_AMBULATORY_CARE_PROVIDER_SITE_OTHER): Payer: BLUE CROSS/BLUE SHIELD | Admitting: Family Medicine

## 2017-11-03 ENCOUNTER — Encounter: Payer: Self-pay | Admitting: Family Medicine

## 2017-11-03 VITALS — BP 120/66 | HR 76 | Temp 97.8°F | Wt 255.0 lb

## 2017-11-03 DIAGNOSIS — L02413 Cutaneous abscess of right upper limb: Secondary | ICD-10-CM

## 2017-11-03 MED ORDER — DOXYCYCLINE HYCLATE 100 MG PO TABS: 100.0000 mg | ORAL_TABLET | Freq: Two times a day (BID) | ORAL | 0 refills | Status: DC

## 2017-11-03 NOTE — Progress Notes (Signed)
Subjective:    Patient ID: Gary Fowler, male    DOB: [birth year], 57 y.o.   MRN: [id number]  Chief Complaint  Patient presents with  . Wound Check  Patient by his wife  HPI Patient was seen today for follow-up right arm abscess.  Wound continues to improve per pt and his wife.  Pt here today for removal of packing and repacking if needed.  Bactrim DS started on 3/7, but switched to Doxycycline 100 mg bid x 10 days on 3/14.  Patient denies fever, chills, nausea, vomiting, diarrhea.  Past Medical History:  Diagnosis Date  . ED (erectile dysfunction)   . Family history of prostate cancer   . Hyperlipidemia   . Hypertension   . Insomnia   . LBP (low back pain)   . Mood swings   . OSA (obstructive sleep apnea)   . Skin cancer of nose 2008   Left  . Type II or unspecified type diabetes mellitus without mention of complication, not stated as uncontrolled     Allergies  Allergen Reactions  . Morphine Sulfate     REACTION: headache    ROS General: Denies fever, chills, night sweats, changes in weight, changes in appetite HEENT: Denies headaches, ear pain, changes in vision, rhinorrhea, sore throat CV: Denies CP, palpitations, SOB, orthopnea Pulm: Denies SOB, cough, wheezing GI: Denies abdominal pain, nausea, vomiting, diarrhea, constipation GU: Denies dysuria, hematuria, frequency, vaginal discharge Msk: Denies muscle cramps, joint pains Neuro: Denies weakness, numbness, tingling Skin: Denies rashes, bruising  + abscess right forearm with packing in place Psych: Denies depression, anxiety, hallucinations     Objective:    Blood pressure 120/66, pulse 76, temperature 97.8 F (36.6 C), temperature source Oral, weight 255 lb (115.7 kg), SpO2 97 %.   Gen. Pleasant, well-nourished, in no distress, normal affect   HEENT: [place]/AT, face symmetric, no scleral icterus, PERRLA, nares patent without drainage Cardiovascular: RRR, no m/r/g, no peripheral edema Neuro:  A&Ox3, CN  II-XII intact, normal gait Skin:  Warm.  Right forearm with packing in place.  Minimal purulent drainage noted on packing.  Packing removed.  1 cm opening in skin noted.  Wound inspected.  No purulent drainage palpation of area surrounding incision skin.  Sanguinous drainage noted. Packing replaced.   Wt Readings from Last 3 Encounters:  11/03/17 255 lb (115.7 kg)  10/28/17 255 lb 3.2 oz (115.8 kg)  10/27/17 255 lb (115.7 kg)    Lab Results  Component Value Date   WBC 6.6 11/24/2016   HGB 15.0 11/24/2016   HCT 43.9 11/24/2016   PLT 167.0 11/24/2016   GLUCOSE 173 (H) 08/01/2017   CHOL 163 08/01/2017   TRIG 159.0 (H) 08/01/2017   HDL 35.30 (L) 08/01/2017   LDLDIRECT 56.3 11/20/2012   LDLCALC 96 08/01/2017   ALT 27 08/01/2017   AST 38 (H) 08/01/2017   NA 141 08/01/2017   K 3.6 08/01/2017   CL 103 08/01/2017   CREATININE 0.84 08/01/2017   BUN 12 08/01/2017   CO2 28 08/01/2017   TSH 1.37 08/01/2017   PSA 0.70 08/01/2017   HGBA1C 7.8 (H) 08/01/2017   MICROALBUR 1.7 08/01/2017    Assessment/Plan:  Abscess of right forearm  -Wound improving -Consent obtained.  Packing removed.  Wound inspected.  Wound repacked. -Patient to remove packing over the weekend Saturday or Sunday. -Patient encouraged to back wound get some air while at home. -Offered referral to Ortho, but patient declines. -We will follow-up in clinic on Monday  afternoon. -pt encourage to eat yogurt or consider a probiotic while completing abx. -Patient to complete 10-day course of doxycycline over the weekend.  Will send in 5 more days of Doxy to patient's pharmacy. - Plan: doxycycline (VIBRA-TABS) 100 MG tablet  Grier Mitts, MD

## 2017-11-07 ENCOUNTER — Ambulatory Visit (INDEPENDENT_AMBULATORY_CARE_PROVIDER_SITE_OTHER): Payer: BLUE CROSS/BLUE SHIELD | Admitting: Family Medicine

## 2017-11-07 ENCOUNTER — Encounter: Payer: Self-pay | Admitting: Family Medicine

## 2017-11-07 VITALS — BP 120/62 | HR 65 | Temp 97.8°F | Wt 255.0 lb

## 2017-11-07 DIAGNOSIS — L02413 Cutaneous abscess of right upper limb: Secondary | ICD-10-CM | POA: Diagnosis not present

## 2017-11-07 DIAGNOSIS — Z5189 Encounter for other specified aftercare: Secondary | ICD-10-CM | POA: Diagnosis not present

## 2017-11-07 NOTE — Progress Notes (Signed)
Subjective:    Patient ID: Gary Fowler, male    DOB: May 09, 1961, 57 y.o.   MRN: [id number]  Chief Complaint  Patient presents with  . Wound Check  Patient is accompanied by his wife.  HPI Patient was seen today for follow-up on right forearm abscess.  Pt seen on 11/03/17, right forearm abscess was repacked.  Pt completed 6 days of Bactrim DS and will have completed Doxycycline 15 days when done. Pt removed packing over the weekend.  Pt notes overall improvement in the appearance of the wound.  No purulent drainage noted.  Pt denies pain, fever, chills, nausea, vomiting.  Past Medical History:  Diagnosis Date  . ED (erectile dysfunction)   . Family history of prostate cancer   . Hyperlipidemia   . Hypertension   . Insomnia   . LBP (low back pain)   . Mood swings   . OSA (obstructive sleep apnea)   . Skin cancer of nose 2008   Left  . Type II or unspecified type diabetes mellitus without mention of complication, not stated as uncontrolled     Allergies  Allergen Reactions  . Morphine Sulfate     REACTION: headache    ROS General: Denies fever, chills, night sweats, changes in weight, changes in appetite HEENT: Denies headaches, ear pain, changes in vision, rhinorrhea, sore throat CV: Denies CP, palpitations, SOB, orthopnea Pulm: Denies SOB, cough, wheezing GI: Denies abdominal pain, nausea, vomiting, diarrhea, constipation GU: Denies dysuria, hematuria, frequency, vaginal discharge Msk: Denies muscle cramps, joint pains Neuro: Denies weakness, numbness, tingling Skin: Denies rashes, bruising + right forearm wound s/p abscess I&D Psych: Denies depression, anxiety, hallucinations     Objective:    Blood pressure 120/62, pulse 65, temperature 97.8 F (36.6 C), temperature source Oral, weight 255 lb (115.7 kg), SpO2 94 %.   Gen. Pleasant, well-nourished, in no distress, normal affect   HEENT: [place]/AT, face symmetric, no scleral icterus, PERRLA, nares patent without  drainage Lungs: no accessory muscle use, CTAB, no wheezes or rales Cardiovascular: RRR, no peripheral edema Neuro:  A&Ox3, CN II-XII intact, normal gait Skin:  Warm, healing R forearm wound ~6 mm s/p abscess I&D.  Skin appears more erythematous in picture than it is in person.  No drainage expressed from wound.  No induration surrounding.      Wt Readings from Last 3 Encounters:  11/07/17 255 lb (115.7 kg)  11/03/17 255 lb (115.7 kg)  10/28/17 255 lb 3.2 oz (115.8 kg)    Lab Results  Component Value Date   WBC 6.6 11/24/2016   HGB 15.0 11/24/2016   HCT 43.9 11/24/2016   PLT 167.0 11/24/2016   GLUCOSE 173 (H) 08/01/2017   CHOL 163 08/01/2017   TRIG 159.0 (H) 08/01/2017   HDL 35.30 (L) 08/01/2017   LDLDIRECT 56.3 11/20/2012   LDLCALC 96 08/01/2017   ALT 27 08/01/2017   AST 38 (H) 08/01/2017   NA 141 08/01/2017   K 3.6 08/01/2017   CL 103 08/01/2017   CREATININE 0.84 08/01/2017   BUN 12 08/01/2017   CO2 28 08/01/2017   TSH 1.37 08/01/2017   PSA 0.70 08/01/2017   HGBA1C 7.8 (H) 08/01/2017   MICROALBUR 1.7 08/01/2017    Assessment/Plan:  Abscess of right forearm -continue letting wound get air when at home -keep area clean and dry. -Given RTC or ED precautions including purulent drainage, increased erythema, increased warmth, fever, chills, n/v, general unwell feeling, etc.  F/u with pcp  Grier Mitts, MD

## 2017-12-01 ENCOUNTER — Other Ambulatory Visit (INDEPENDENT_AMBULATORY_CARE_PROVIDER_SITE_OTHER): Payer: BLUE CROSS/BLUE SHIELD

## 2017-12-01 DIAGNOSIS — E1165 Type 2 diabetes mellitus with hyperglycemia: Secondary | ICD-10-CM

## 2017-12-01 DIAGNOSIS — IMO0002 Reserved for concepts with insufficient information to code with codable children: Secondary | ICD-10-CM

## 2017-12-01 LAB — BASIC METABOLIC PANEL
BUN: 12 mg/dL (ref 6–23)
CO2: 26 meq/L (ref 19–32)
Calcium: 8.7 mg/dL (ref 8.4–10.5)
Chloride: 104 mEq/L (ref 96–112)
Creatinine, Ser: 0.92 mg/dL (ref 0.40–1.50)
GFR: 89.61 mL/min (ref >60.00)
Glucose, Bld: 139 mg/dL — ABNORMAL HIGH (ref 70–99)
POTASSIUM: 3.5 meq/L (ref 3.5–5.1)
Sodium: 139 mEq/L (ref 135–145)

## 2017-12-01 LAB — HEMOGLOBIN A1C: Hgb A1c MFr Bld: 7 % — ABNORMAL HIGH (ref 4.6–6.5)

## 2017-12-06 ENCOUNTER — Encounter: Payer: Self-pay | Admitting: Internal Medicine

## 2017-12-06 ENCOUNTER — Ambulatory Visit (INDEPENDENT_AMBULATORY_CARE_PROVIDER_SITE_OTHER): Payer: BLUE CROSS/BLUE SHIELD | Admitting: Internal Medicine

## 2017-12-06 VITALS — BP 128/82 | HR 60 | Temp 97.9°F | Ht 69.0 in | Wt 259.0 lb

## 2017-12-06 DIAGNOSIS — Z0000 Encounter for general adult medical examination without abnormal findings: Secondary | ICD-10-CM

## 2017-12-06 DIAGNOSIS — Z23 Encounter for immunization: Secondary | ICD-10-CM

## 2017-12-06 DIAGNOSIS — I10 Essential (primary) hypertension: Secondary | ICD-10-CM | POA: Diagnosis not present

## 2017-12-06 DIAGNOSIS — E118 Type 2 diabetes mellitus with unspecified complications: Secondary | ICD-10-CM

## 2017-12-06 DIAGNOSIS — E785 Hyperlipidemia, unspecified: Secondary | ICD-10-CM

## 2017-12-06 NOTE — Assessment & Plan Note (Signed)
Amaryl, metformin, Alloglipt- Pioglitazone, Oseni 

## 2017-12-06 NOTE — Addendum Note (Signed)
Addended by: Karren Cobble on: 12/06/2017 09:42 AM   Modules accepted: Orders

## 2017-12-06 NOTE — Assessment & Plan Note (Signed)
BP Readings from Last 3 Encounters:  12/06/17 (!) 128/92  11/07/17 120/62  11/03/17 120/66

## 2017-12-06 NOTE — Progress Notes (Signed)
Subjective:  Patient ID: Gary Fowler, male    DOB: 15-Mar-1961  Age: 57 y.o. MRN: [id number]  CC: No chief complaint on file.   HPI ALPHEUS STIFF presents for a well exam  Outpatient Medications Prior to Visit  Medication Sig Dispense Refill  . aspirin 81 MG chewable tablet Chew 81 mg by mouth daily.      Marland Kitchen atorvastatin (LIPITOR) 20 MG tablet TAKE ONE TABLET BY MOUTH ONCE DAILY AT  6  PM 90 tablet 2  . bisoprolol-hydrochlorothiazide (ZIAC) 10-6.25 MG tablet TAKE ONE TABLET BY MOUTH ONCE DAILY 90 tablet 3  . Empagliflozin-Linagliptin (GLYXAMBI) 10-5 MG TABS Take 1 tablet by mouth daily. 30 tablet 11  . glimepiride (AMARYL) 2 MG tablet TAKE ONE TABLET BY MOUTH TWICE DAILY 180 tablet 3  . ibuprofen (ADVIL,MOTRIN) 600 MG tablet Take 1 tablet (600 mg total) by mouth 2 (two) times daily as needed. for pain 180 tablet 0  . metFORMIN (GLUCOPHAGE) 1000 MG tablet TAKE ONE TABLET BY MOUTH TWICE DAILY WITH MEALS 180 tablet 3  . OSENI 25-30 MG TABS   3  . pioglitazone (ACTOS) 30 MG tablet Take 1 tablet (30 mg total) by mouth daily. 30 tablet 11  . tadalafil (CIALIS) 20 MG tablet Take 1 tablet (20 mg total) by mouth as directed. Every 3 days as needed 30 tablet 5  . doxycycline (VIBRA-TABS) 100 MG tablet Take 1 tablet (100 mg total) by mouth 2 (two) times daily. 20 tablet 0  . doxycycline (VIBRA-TABS) 100 MG tablet Take 1 tablet (100 mg total) by mouth 2 (two) times daily. 10 tablet 0   No facility-administered medications prior to visit.     ROS Review of Systems  Constitutional: Negative for appetite change, fatigue and unexpected weight change.  HENT: Negative for congestion, nosebleeds, sneezing, sore throat and trouble swallowing.   Eyes: Negative for itching and visual disturbance.  Respiratory: Negative for cough.   Cardiovascular: Negative for chest pain, palpitations and leg swelling.  Gastrointestinal: Negative for abdominal distention, blood in stool, diarrhea and nausea.    Genitourinary: Negative for frequency and hematuria.  Musculoskeletal: Positive for arthralgias and back pain. Negative for gait problem, joint swelling and neck pain.  Skin: Negative for rash.  Neurological: Negative for dizziness, tremors, speech difficulty and weakness.  Psychiatric/Behavioral: Negative for agitation, dysphoric mood, sleep disturbance and suicidal ideas. The patient is not nervous/anxious.     Objective:  BP (!) 128/92 (BP Location: Left Arm, Patient Position: Sitting, Cuff Size: Large)   Pulse 60   Temp 97.9 F (36.6 C) (Oral)   Ht 5\' 9"  (1.753 m)   Wt 259 lb (117.5 kg)   SpO2 99%   BMI 38.25 kg/m   BP Readings from Last 3 Encounters:  12/06/17 (!) 128/92  11/07/17 120/62  11/03/17 120/66    Wt Readings from Last 3 Encounters:  12/06/17 259 lb (117.5 kg)  11/07/17 255 lb (115.7 kg)  11/03/17 255 lb (115.7 kg)    Physical Exam  Constitutional: He is oriented to person, place, and time. He appears well-developed and well-nourished. No distress.  HENT:  Head: Normocephalic and atraumatic.  Right Ear: External ear normal.  Left Ear: External ear normal.  Nose: Nose normal.  Mouth/Throat: Oropharynx is clear and moist. No oropharyngeal exudate.  Eyes: Pupils are equal, round, and reactive to light. Conjunctivae and EOM are normal. Right eye exhibits no discharge. Left eye exhibits no discharge. No scleral icterus.  Neck: Normal range  of motion. Neck supple. No JVD present. No tracheal deviation present. No thyromegaly present.  Cardiovascular: Normal rate, regular rhythm, normal heart sounds and intact distal pulses. Exam reveals no gallop and no friction rub.  No murmur heard. Pulmonary/Chest: Effort normal and breath sounds normal. No stridor. No respiratory distress. He has no wheezes. He has no rales. He exhibits no tenderness.  Abdominal: Soft. Bowel sounds are normal. He exhibits no distension and no mass. There is no tenderness. There is no rebound  and no guarding.  Genitourinary: Rectum normal, prostate normal and penis normal. Rectal exam shows guaiac negative stool. No penile tenderness.  Musculoskeletal: Normal range of motion. He exhibits no edema or tenderness.  Lymphadenopathy:    He has no cervical adenopathy.  Neurological: He is alert and oriented to person, place, and time. He has normal reflexes. He displays normal reflexes. No cranial nerve deficit. He exhibits normal muscle tone. Coordination normal.  Skin: Skin is warm and dry. No rash noted. He is not diaphoretic. No erythema. No pallor.  Psychiatric: He has a normal mood and affect. His behavior is normal. Judgment and thought content normal.   Obese Scars on skin  Lab Results  Component Value Date   WBC 6.6 11/24/2016   HGB 15.0 11/24/2016   HCT 43.9 11/24/2016   PLT 167.0 11/24/2016   GLUCOSE 139 (H) 12/01/2017   CHOL 163 08/01/2017   TRIG 159.0 (H) 08/01/2017   HDL 35.30 (L) 08/01/2017   LDLDIRECT 56.3 11/20/2012   LDLCALC 96 08/01/2017   ALT 27 08/01/2017   AST 38 (H) 08/01/2017   NA 139 12/01/2017   K 3.5 12/01/2017   CL 104 12/01/2017   CREATININE 0.92 12/01/2017   BUN 12 12/01/2017   CO2 26 12/01/2017   TSH 1.37 08/01/2017   PSA 0.70 08/01/2017   HGBA1C 7.0 (H) 12/01/2017   MICROALBUR 1.7 08/01/2017    No results found.  Assessment & Plan:   There are no diagnoses linked to this encounter. I have discontinued Riyan D. Weakland's doxycycline and doxycycline. I am also having him maintain his aspirin, tadalafil, atorvastatin, metFORMIN, ibuprofen, glimepiride, bisoprolol-hydrochlorothiazide, Empagliflozin-Linagliptin, pioglitazone, and OSENI.  No orders of the defined types were placed in this encounter.    Follow-up: No follow-ups on file.  Walker Kehr, MD

## 2017-12-06 NOTE — Assessment & Plan Note (Addendum)
He appears well, in no apparent distress.  Alert and oriented times three, pleasant and cooperative. Vital signs are as documented in vital signs section. Colon due 2022

## 2017-12-06 NOTE — Assessment & Plan Note (Signed)
Lipitor 

## 2018-01-17 ENCOUNTER — Encounter: Payer: Self-pay | Admitting: Podiatry

## 2018-01-17 ENCOUNTER — Ambulatory Visit: Payer: BLUE CROSS/BLUE SHIELD | Admitting: Podiatry

## 2018-01-17 VITALS — BP 132/80 | HR 62

## 2018-01-17 DIAGNOSIS — L600 Ingrowing nail: Secondary | ICD-10-CM | POA: Diagnosis not present

## 2018-01-17 MED ORDER — NEOMYCIN-POLYMYXIN-HC 1 % OT SOLN: OTIC | 1 refills | Status: DC

## 2018-01-17 NOTE — Patient Instructions (Signed)

## 2018-01-18 NOTE — Progress Notes (Signed)
Subjective:  Patient ID: Gary Fowler, male    DOB: [birth year],  MRN: [id number] HPI Chief Complaint  Patient presents with  . Nail Problem    left foot great toe medial side; pt stated, "been 2wks have ingrown, tried to cut it out myself but couldn't get all of it; now red and swollen"    57 y.o. male presents with the above complaint.   ROS: He denies fever chills nausea vomiting muscle aches pains calf pain back pain chest pain shortness of breath headache.  Past Medical History:  Diagnosis Date  . ED (erectile dysfunction)   . Family history of prostate cancer   . Hyperlipidemia   . Hypertension   . Insomnia   . LBP (low back pain)   . Mood swings   . OSA (obstructive sleep apnea)   . Skin cancer of nose 2008   Left  . Type II or unspecified type diabetes mellitus without mention of complication, not stated as uncontrolled    Past Surgical History:  Procedure Laterality Date  . arm fracture     with plate  . ARTHROSCOPY KNEE W/ DRILLING     rt. knee  . bone spur removal     lt. shoulder  . LUMBAR FUSION    . LUMBAR LAMINECTOMY    . SKIN CANCER EXCISION     face    Current Outpatient Medications:  .  aspirin 81 MG chewable tablet, Chew 81 mg by mouth daily.  , Disp: , Rfl:  .  atorvastatin (LIPITOR) 20 MG tablet, TAKE ONE TABLET BY MOUTH ONCE DAILY AT  6  PM, Disp: 90 tablet, Rfl: 2 .  bisoprolol-hydrochlorothiazide (ZIAC) 10-6.25 MG tablet, TAKE ONE TABLET BY MOUTH ONCE DAILY, Disp: 90 tablet, Rfl: 3 .  glimepiride (AMARYL) 2 MG tablet, TAKE ONE TABLET BY MOUTH TWICE DAILY, Disp: 180 tablet, Rfl: 3 .  ibuprofen (ADVIL,MOTRIN) 600 MG tablet, Take 1 tablet (600 mg total) by mouth 2 (two) times daily as needed. for pain, Disp: 180 tablet, Rfl: 0 .  metFORMIN (GLUCOPHAGE) 1000 MG tablet, TAKE ONE TABLET BY MOUTH TWICE DAILY WITH MEALS, Disp: 180 tablet, Rfl: 3 .  OSENI 25-30 MG TABS, , Disp: , Rfl: 3 .  pioglitazone (ACTOS) 30 MG tablet, Take 1 tablet (30 mg  total) by mouth daily., Disp: 30 tablet, Rfl: 11 .  tadalafil (CIALIS) 20 MG tablet, Take 1 tablet (20 mg total) by mouth as directed. Every 3 days as needed, Disp: 30 tablet, Rfl: 5 .  NEOMYCIN-POLYMYXIN-HYDROCORTISONE (CORTISPORIN) 1 % SOLN OTIC solution, Apply 1-2 drops to toe BID after soaking, Disp: 10 mL, Rfl: 1  Allergies  Allergen Reactions  . Morphine Sulfate     REACTION: headache   Review of Systems Objective:   Vitals:   01/17/18 1629  BP: 132/80  Pulse: 62    General: Well developed, nourished, in no acute distress, alert and oriented x3   Dermatological: Skin is warm, dry and supple bilateral. Nails x 10 are well maintained; remaining integument appears unremarkable at this time. There are no open sores, no preulcerative lesions, no rash or signs of infection present.  Ingrown toenail hallux left.  Tibial border is sharply incurvated with the distal granuloma present.  There is no purulence no malodor there is no signs of cellulitis.  Mild to moderately tender to palpation.  Vascular: Dorsalis Pedis artery and Posterior Tibial artery pedal pulses are 2/4 bilateral with immedate capillary fill time. Pedal hair growth present.  No varicosities and no lower extremity edema present bilateral.   Neruologic: Grossly intact via light touch bilateral. Vibratory intact via tuning fork bilateral. Protective threshold with Semmes Wienstein monofilament intact to all pedal sites bilateral. Patellar and Achilles deep tendon reflexes 2+ bilateral. No Babinski or clonus noted bilateral.   Musculoskeletal: No gross boney pedal deformities bilateral. No pain, crepitus, or limitation noted with foot and ankle range of motion bilateral. Muscular strength 5/5 in all groups tested bilateral.  Gait: Unassisted, Nonantalgic.    Radiographs:  None taken  Assessment & Plan:   Assessment: Ingrown toenail hallux left tibial border  Plan: Discussed etiology pathology conservative versus  surgical therapies.  At this point chemical matrixectomy was performed after 3 cc of a 50-50 mixture of Marcaine plain lidocaine plain was infiltrated in a hallux block about the left great toe.  He tolerated procedure well without complications.  He was provided with both oral and written home-going instructions for the care and soaking of the toe he also received a prescription for Cortisporin Otic to be applied twice daily after soaking.  I will follow-up with him in 2 weeks     Aqeel Norgaard T. [place], [place]

## 2018-02-14 ENCOUNTER — Ambulatory Visit (INDEPENDENT_AMBULATORY_CARE_PROVIDER_SITE_OTHER): Payer: BLUE CROSS/BLUE SHIELD

## 2018-02-14 ENCOUNTER — Ambulatory Visit (INDEPENDENT_AMBULATORY_CARE_PROVIDER_SITE_OTHER): Payer: BLUE CROSS/BLUE SHIELD | Admitting: Podiatry

## 2018-02-14 ENCOUNTER — Encounter: Payer: Self-pay | Admitting: Podiatry

## 2018-02-14 DIAGNOSIS — M722 Plantar fascial fibromatosis: Secondary | ICD-10-CM

## 2018-02-14 DIAGNOSIS — Z9889 Other specified postprocedural states: Secondary | ICD-10-CM

## 2018-02-14 DIAGNOSIS — L600 Ingrowing nail: Secondary | ICD-10-CM

## 2018-02-14 MED ORDER — METHYLPREDNISOLONE 4 MG PO TBPK: ORAL_TABLET | ORAL | 0 refills | Status: DC

## 2018-02-14 MED ORDER — MELOXICAM 15 MG PO TABS: 15.0000 mg | ORAL_TABLET | Freq: Every day | ORAL | 3 refills | Status: DC

## 2018-02-14 NOTE — Patient Instructions (Signed)

## 2018-02-15 NOTE — Progress Notes (Signed)
He presents today chief complaint of painful heel left.  He states that the toenail that was performed last visit June 4 is doing much better and has gone on to heal without any problems.  He states that the left heel pain is been going on for many months now and is just getting to the point where it will no longer subside.  Objective: Vital signs are stable he is alert and oriented x3.  Pulses are palpable.  Neurologic sensorium is intact.  DP reflexes are intact muscle strength is normal symmetrical.  Tibial margin to the hallux left is gone on to heal uneventfully there is no erythema edema cellulitis drainage or odor he has pain on palpation medial calcaneal tubercle of the left heel.  Assessment: Well-healing surgical toe hallux left.  Plantar fasciitis left heel.  Plan: Injected the left heel today after sterile Betadine skin prep 20 mg Kenalog 5 mg Marcaine start him on Medrol Dosepak even though he has diabetes with a hemoglobin A1c of 7.0 he will then continue afterwards with meloxicam 15 mg 1 p.o. daily dispense plantar pressure brace and a night splint discussed appropriate shoe gear stretching exercise ice therapy and shoe modification.  I will follow-up with him in 1 month call sooner if needed.

## 2018-02-17 ENCOUNTER — Other Ambulatory Visit: Payer: Self-pay | Admitting: Internal Medicine

## 2018-04-05 ENCOUNTER — Other Ambulatory Visit (INDEPENDENT_AMBULATORY_CARE_PROVIDER_SITE_OTHER): Payer: BLUE CROSS/BLUE SHIELD

## 2018-04-05 DIAGNOSIS — IMO0002 Reserved for concepts with insufficient information to code with codable children: Secondary | ICD-10-CM

## 2018-04-05 DIAGNOSIS — E1165 Type 2 diabetes mellitus with hyperglycemia: Secondary | ICD-10-CM | POA: Diagnosis not present

## 2018-04-05 LAB — BASIC METABOLIC PANEL
BUN: 17 mg/dL (ref 6–23)
CALCIUM: 9.5 mg/dL (ref 8.4–10.5)
CO2: 25 mEq/L (ref 19–32)
Chloride: 102 mEq/L (ref 96–112)
Creatinine, Ser: 1.03 mg/dL (ref 0.40–1.50)
GFR: 78.57 mL/min (ref >60.00)
GLUCOSE: 174 mg/dL — AB (ref 70–99)
Potassium: 4.3 mEq/L (ref 3.5–5.1)
Sodium: 139 mEq/L (ref 135–145)

## 2018-04-05 LAB — HEMOGLOBIN A1C: Hgb A1c MFr Bld: 7.3 % — ABNORMAL HIGH (ref 4.6–6.5)

## 2018-04-06 ENCOUNTER — Encounter: Payer: Self-pay | Admitting: Podiatry

## 2018-04-06 ENCOUNTER — Ambulatory Visit: Payer: BLUE CROSS/BLUE SHIELD | Admitting: Podiatry

## 2018-04-06 DIAGNOSIS — M722 Plantar fascial fibromatosis: Secondary | ICD-10-CM | POA: Diagnosis not present

## 2018-04-06 NOTE — Progress Notes (Signed)
He presents today for follow-up of his plantar fasciitis of his left foot.  He states that he is doing everything just as we had asked states that he is doing much better he states that he is just not wearing his night boot as often as he feels like he needs to.  Objective: Vital signs are stable he is alert and oriented x3.  Pulses are palpable.  Neurologic sensorium is intact degenerative flexors are intact muscle strength is normal symmetrical.  Cutaneous evaluation demonstrates supple well-hydrated cutis no erythema edema cellulitis drainage or odor.  Pain on palpation medial calcaneal tubercle of the left heel.  Assessment: Pain in limb secondary to plantar fasciitis left.  Plan: I injected his left heel today with 20 mg Kenalog 5 mg Marcaine under sterile Betadine skin prep and request that he continue all other conservative therapies including plantar fascial brace and night splint.  He will also continue with his meloxicam.  We once again discussed shoe gear.  Remember he went to the emergency department today with a laceration to his scalp.

## 2018-04-07 ENCOUNTER — Encounter: Payer: Self-pay | Admitting: Internal Medicine

## 2018-04-07 ENCOUNTER — Ambulatory Visit: Payer: BLUE CROSS/BLUE SHIELD | Admitting: Internal Medicine

## 2018-04-07 DIAGNOSIS — I10 Essential (primary) hypertension: Secondary | ICD-10-CM

## 2018-04-07 DIAGNOSIS — E785 Hyperlipidemia, unspecified: Secondary | ICD-10-CM

## 2018-04-07 DIAGNOSIS — Z6838 Body mass index (BMI) 38.0-38.9, adult: Secondary | ICD-10-CM

## 2018-04-07 DIAGNOSIS — E118 Type 2 diabetes mellitus with unspecified complications: Secondary | ICD-10-CM | POA: Diagnosis not present

## 2018-04-07 NOTE — Assessment & Plan Note (Signed)
Lipitor 

## 2018-04-07 NOTE — Assessment & Plan Note (Signed)
Bisoprolol HCT 

## 2018-04-07 NOTE — Assessment & Plan Note (Signed)
Lost wt °

## 2018-04-07 NOTE — Progress Notes (Signed)
Subjective:  Patient ID: Gary Fowler, male    DOB: February 12, 1961  Age: 57 y.o. MRN: [id number]  CC: No chief complaint on file.   HPI Gary Fowler presents for DM, HTN, heel pain L - just took prednisone 1 mo ago. A bed fell on the head yesterday (no LOC) - 1 staple  Outpatient Medications Prior to Visit  Medication Sig Dispense Refill  . aspirin 81 MG chewable tablet Chew 81 mg by mouth daily.      Marland Kitchen atorvastatin (LIPITOR) 20 MG tablet TAKE 1 TABLET BY MOUTH ONCE DAILY AT  6  PM 90 tablet 3  . bisoprolol-hydrochlorothiazide (ZIAC) 10-6.25 MG tablet TAKE ONE TABLET BY MOUTH ONCE DAILY 90 tablet 3  . fluorouracil (EFUDEX) 5 % cream APPLY A THIN LAYER ON THE SKIN TWICE DAILY TO AFFECTED AREA FOR 2 WEEKS  0  . glimepiride (AMARYL) 2 MG tablet TAKE ONE TABLET BY MOUTH TWICE DAILY 180 tablet 3  . ibuprofen (ADVIL,MOTRIN) 600 MG tablet Take 1 tablet (600 mg total) by mouth 2 (two) times daily as needed. for pain 180 tablet 0  . meloxicam (MOBIC) 15 MG tablet Take 1 tablet (15 mg total) by mouth daily. 30 tablet 3  . metFORMIN (GLUCOPHAGE) 1000 MG tablet TAKE ONE TABLET BY MOUTH TWICE DAILY WITH MEALS 180 tablet 3  . OSENI 25-30 MG TABS   3  . pioglitazone (ACTOS) 30 MG tablet Take 1 tablet (30 mg total) by mouth daily. 30 tablet 11  . tadalafil (CIALIS) 20 MG tablet Take 1 tablet (20 mg total) by mouth as directed. Every 3 days as needed 30 tablet 5  . NEOMYCIN-POLYMYXIN-HYDROCORTISONE (CORTISPORIN) 1 % SOLN OTIC solution Apply 1-2 drops to toe BID after soaking (Patient not taking: Reported on 04/07/2018) 10 mL 1   No facility-administered medications prior to visit.     ROS: Review of Systems  Constitutional: Negative for appetite change, fatigue and unexpected weight change.  HENT: Negative for congestion, nosebleeds, sneezing, sore throat and trouble swallowing.   Eyes: Negative for itching and visual disturbance.  Respiratory: Negative for cough.   Cardiovascular: Negative  for chest pain, palpitations and leg swelling.  Gastrointestinal: Negative for abdominal distention, blood in stool, diarrhea and nausea.  Genitourinary: Negative for frequency and hematuria.  Musculoskeletal: Positive for arthralgias. Negative for back pain, gait problem, joint swelling and neck pain.  Skin: Negative for rash.  Neurological: Negative for dizziness, tremors, speech difficulty and weakness.  Psychiatric/Behavioral: Negative for agitation, dysphoric mood, sleep disturbance and suicidal ideas. The patient is not nervous/anxious.     Objective:  BP 126/78 (BP Location: Left Arm, Patient Position: Sitting, Cuff Size: Large)   Pulse (!) 56   Temp 98.1 F (36.7 C) (Oral)   Ht 5\' 9"  (1.753 m)   Wt 255 lb (115.7 kg)   SpO2 96%   BMI 37.66 kg/m   BP Readings from Last 3 Encounters:  04/07/18 126/78  01/17/18 132/80  12/06/17 128/82    Wt Readings from Last 3 Encounters:  04/07/18 255 lb (115.7 kg)  12/06/17 259 lb (117.5 kg)  11/07/17 255 lb (115.7 kg)    Physical Exam  Constitutional: He is oriented to person, place, and time. He appears well-developed. No distress.  NAD  HENT:  Mouth/Throat: Oropharynx is clear and moist.  Eyes: Pupils are equal, round, and reactive to light. Conjunctivae are normal.  Neck: Normal range of motion. No JVD present. No thyromegaly present.  Cardiovascular: Normal rate,  regular rhythm, normal heart sounds and intact distal pulses. Exam reveals no gallop and no friction rub.  No murmur heard. Pulmonary/Chest: Effort normal and breath sounds normal. No respiratory distress. He has no wheezes. He has no rales. He exhibits no tenderness.  Abdominal: Soft. Bowel sounds are normal. He exhibits no distension and no mass. There is no tenderness. There is no rebound and no guarding.  Musculoskeletal: Normal range of motion. He exhibits tenderness. He exhibits no edema.  Lymphadenopathy:    He has no cervical adenopathy.  Neurological: He is  alert and oriented to person, place, and time. He has normal reflexes. No cranial nerve deficit. He exhibits normal muscle tone. He displays a negative Romberg sign. Coordination and gait normal.  Skin: Skin is warm and dry. No rash noted.  Psychiatric: He has a normal mood and affect. His behavior is normal. Judgment and thought content normal.  L heel tender Scalp wound  Lab Results  Component Value Date   WBC 6.6 11/24/2016   HGB 15.0 11/24/2016   HCT 43.9 11/24/2016   PLT 167.0 11/24/2016   GLUCOSE 174 (H) 04/05/2018   CHOL 163 08/01/2017   TRIG 159.0 (H) 08/01/2017   HDL 35.30 (L) 08/01/2017   LDLDIRECT 56.3 11/20/2012   LDLCALC 96 08/01/2017   ALT 27 08/01/2017   AST 38 (H) 08/01/2017   NA 139 04/05/2018   K 4.3 04/05/2018   CL 102 04/05/2018   CREATININE 1.03 04/05/2018   BUN 17 04/05/2018   CO2 25 04/05/2018   TSH 1.37 08/01/2017   PSA 0.70 08/01/2017   HGBA1C 7.3 (H) 04/05/2018   MICROALBUR 1.7 08/01/2017    No results found.  Assessment & Plan:   There are no diagnoses linked to this encounter.   No orders of the defined types were placed in this encounter.    Follow-up: No follow-ups on file.  Walker Kehr, MD

## 2018-04-07 NOTE — Assessment & Plan Note (Signed)
Amaryl, metformin, Alloglipt- Pioglitazone, Oseni

## 2018-04-30 ENCOUNTER — Other Ambulatory Visit: Payer: Self-pay | Admitting: Internal Medicine

## 2018-05-04 ENCOUNTER — Ambulatory Visit: Payer: PRIVATE HEALTH INSURANCE | Admitting: Podiatry

## 2018-05-04 ENCOUNTER — Encounter: Payer: Self-pay | Admitting: Podiatry

## 2018-05-04 DIAGNOSIS — M722 Plantar fascial fibromatosis: Secondary | ICD-10-CM

## 2018-05-04 NOTE — Progress Notes (Signed)
Presents today for follow-up of his left heel.  He states that he is approximately 80 to 90% improved continues to do all conservative therapies religiously with exception of the night splint which he only wears while sitting before going to bed.  Objective: Vital signs are stable alert and oriented x3.  Pulses are palpable.  Neurologic sensorium is intact to palpation medial calcaneal tubercle of the left heel.  Assessment: 80 to 90% resolution of pain to the left heel.  Plan: After sterile Betadine skin prep I injected the left heel today with 20 mg Kenalog 5 mg Marcaine point maximal tenderness left heel.  Tolerated procedure well without complications recommended to continue other conservative therapies such as the plantar fascial brace good shoe gear and the use of night splint.  He will also continue anti-inflammatories.  Follow-up with him in 1 month if necessary.  May need to consider orthotics.

## 2018-05-27 ENCOUNTER — Other Ambulatory Visit: Payer: Self-pay | Admitting: Internal Medicine

## 2018-06-01 ENCOUNTER — Ambulatory Visit: Payer: PRIVATE HEALTH INSURANCE | Admitting: Podiatry

## 2018-06-10 ENCOUNTER — Other Ambulatory Visit: Payer: Self-pay | Admitting: Internal Medicine

## 2018-06-29 ENCOUNTER — Encounter: Payer: Self-pay | Admitting: Podiatry

## 2018-06-29 ENCOUNTER — Ambulatory Visit (INDEPENDENT_AMBULATORY_CARE_PROVIDER_SITE_OTHER): Payer: PRIVATE HEALTH INSURANCE | Admitting: Podiatry

## 2018-06-29 DIAGNOSIS — M722 Plantar fascial fibromatosis: Secondary | ICD-10-CM | POA: Diagnosis not present

## 2018-06-29 MED ORDER — MELOXICAM 15 MG PO TABS: 15.0000 mg | ORAL_TABLET | Freq: Every day | ORAL | 3 refills | Status: DC

## 2018-07-02 NOTE — Progress Notes (Signed)
He presents today for follow-up of his left heel pain states that is not good it is actually been worse in the past 3 weeks.  Objective: Vital signs are stable he is alert and oriented x3.  Pulses are palpable.  He has pain on palpation medial calcaneal tubercle of the left heel.  Assessment: Chronic intractable plantar fasciitis left.  Plan: Injected his fourth dose of cortisone today.  He will follow-up with Liliane Channel for orthotics also discussed the need for surgical intervention and possible shockwave.

## 2018-07-12 ENCOUNTER — Other Ambulatory Visit (INDEPENDENT_AMBULATORY_CARE_PROVIDER_SITE_OTHER): Payer: BLUE CROSS/BLUE SHIELD

## 2018-07-12 DIAGNOSIS — IMO0002 Reserved for concepts with insufficient information to code with codable children: Secondary | ICD-10-CM

## 2018-07-12 DIAGNOSIS — E1165 Type 2 diabetes mellitus with hyperglycemia: Secondary | ICD-10-CM

## 2018-07-12 LAB — BASIC METABOLIC PANEL
BUN: 21 mg/dL (ref 6–23)
CHLORIDE: 102 meq/L (ref 96–112)
CO2: 28 meq/L (ref 19–32)
Calcium: 8.7 mg/dL (ref 8.4–10.5)
Creatinine, Ser: 0.94 mg/dL (ref 0.40–1.50)
GFR: 87.23 mL/min (ref >60.00)
GLUCOSE: 208 mg/dL — AB (ref 70–99)
POTASSIUM: 4.1 meq/L (ref 3.5–5.1)
SODIUM: 137 meq/L (ref 135–145)

## 2018-07-12 LAB — HEMOGLOBIN A1C: Hgb A1c MFr Bld: 7.8 % — ABNORMAL HIGH (ref 4.6–6.5)

## 2018-07-18 ENCOUNTER — Ambulatory Visit: Payer: PRIVATE HEALTH INSURANCE | Admitting: Internal Medicine

## 2018-07-18 ENCOUNTER — Encounter: Payer: Self-pay | Admitting: Internal Medicine

## 2018-07-18 VITALS — BP 128/80 | HR 58 | Temp 97.8°F | Ht 69.0 in | Wt 260.0 lb

## 2018-07-18 DIAGNOSIS — Z6838 Body mass index (BMI) 38.0-38.9, adult: Secondary | ICD-10-CM

## 2018-07-18 DIAGNOSIS — E118 Type 2 diabetes mellitus with unspecified complications: Secondary | ICD-10-CM

## 2018-07-18 DIAGNOSIS — E785 Hyperlipidemia, unspecified: Secondary | ICD-10-CM | POA: Diagnosis not present

## 2018-07-18 DIAGNOSIS — Z0000 Encounter for general adult medical examination without abnormal findings: Secondary | ICD-10-CM

## 2018-07-18 NOTE — Progress Notes (Signed)
Subjective:  Patient ID: Gary Fowler, male    DOB: September 09, 1960  Age: 57 y.o. MRN: [id number]  CC: No chief complaint on file.   HPI MAVERICK DIEUDONNE presents for DM, HTN, dyslipidemia f/u. Took steroids for the heel pain  C/o leg cramps on Lipitor  Outpatient Medications Prior to Visit  Medication Sig Dispense Refill  . aspirin 81 MG chewable tablet Chew 81 mg by mouth daily.      Marland Kitchen atorvastatin (LIPITOR) 20 MG tablet TAKE 1 TABLET BY MOUTH ONCE DAILY AT  6  PM 90 tablet 3  . bisoprolol-hydrochlorothiazide (ZIAC) 10-6.25 MG tablet TAKE 1 TABLET BY MOUTH ONCE DAILY 90 tablet 3  . fluorouracil (EFUDEX) 5 % cream APPLY A THIN LAYER ON THE SKIN TWICE DAILY TO AFFECTED AREA FOR 2 WEEKS  0  . glimepiride (AMARYL) 2 MG tablet Take 1 tablet (2 mg total) by mouth 2 (two) times daily. Keep scheduled appt in Dec for future refills 180 tablet 0  . ibuprofen (ADVIL,MOTRIN) 600 MG tablet Take 1 tablet (600 mg total) by mouth 2 (two) times daily as needed. for pain 180 tablet 0  . meloxicam (MOBIC) 15 MG tablet Take 1 tablet (15 mg total) by mouth daily. 30 tablet 3  . metFORMIN (GLUCOPHAGE) 1000 MG tablet TAKE 1 TABLET BY MOUTH TWICE DAILY WITH MEALS 180 tablet 3  . OSENI 25-30 MG TABS   3  . tadalafil (CIALIS) 20 MG tablet Take 1 tablet (20 mg total) by mouth as directed. Every 3 days as needed 30 tablet 5   No facility-administered medications prior to visit.     ROS: Review of Systems  Constitutional: Negative for appetite change, fatigue and unexpected weight change.  HENT: Negative for congestion, nosebleeds, sneezing, sore throat and trouble swallowing.   Eyes: Negative for itching and visual disturbance.  Respiratory: Negative for cough.   Cardiovascular: Negative for chest pain, palpitations and leg swelling.  Gastrointestinal: Negative for abdominal distention, blood in stool, diarrhea and nausea.  Genitourinary: Negative for frequency and hematuria.  Musculoskeletal: Positive  for myalgias. Negative for back pain, gait problem, joint swelling and neck pain.  Skin: Negative for rash.  Neurological: Negative for dizziness, tremors, speech difficulty and weakness.  Psychiatric/Behavioral: Negative for agitation, dysphoric mood, sleep disturbance and suicidal ideas. The patient is not nervous/anxious.   leg cramps  Objective:  BP 128/80 (BP Location: Left Arm, Patient Position: Sitting, Cuff Size: Large)   Pulse (!) 58   Temp 97.8 F (36.6 C) (Oral)   Ht 5\' 9"  (1.753 m)   Wt 260 lb (117.9 kg)   SpO2 96%   BMI 38.40 kg/m   BP Readings from Last 3 Encounters:  07/18/18 128/80  04/07/18 126/78  01/17/18 132/80    Wt Readings from Last 3 Encounters:  07/18/18 260 lb (117.9 kg)  04/07/18 255 lb (115.7 kg)  12/06/17 259 lb (117.5 kg)    Physical Exam  Constitutional: He is oriented to person, place, and time. He appears well-developed. No distress.  NAD  HENT:  Mouth/Throat: Oropharynx is clear and moist.  Eyes: Pupils are equal, round, and reactive to light. Conjunctivae are normal.  Neck: Normal range of motion. No JVD present. No thyromegaly present.  Cardiovascular: Normal rate, regular rhythm, normal heart sounds and intact distal pulses. Exam reveals no gallop and no friction rub.  No murmur heard. Pulmonary/Chest: Effort normal and breath sounds normal. No respiratory distress. He has no wheezes. He has no rales.  He exhibits no tenderness.  Abdominal: Soft. Bowel sounds are normal. He exhibits no distension and no mass. There is no tenderness. There is no rebound and no guarding.  Musculoskeletal: Normal range of motion. He exhibits no edema or tenderness.  Lymphadenopathy:    He has no cervical adenopathy.  Neurological: He is alert and oriented to person, place, and time. He has normal reflexes. No cranial nerve deficit. He exhibits normal muscle tone. He displays a negative Romberg sign. Coordination and gait normal.  Skin: Skin is warm and dry.  No rash noted.  Psychiatric: He has a normal mood and affect. His behavior is normal. Judgment and thought content normal.    Lab Results  Component Value Date   WBC 6.6 11/24/2016   HGB 15.0 11/24/2016   HCT 43.9 11/24/2016   PLT 167.0 11/24/2016   GLUCOSE 208 (H) 07/12/2018   CHOL 163 08/01/2017   TRIG 159.0 (H) 08/01/2017   HDL 35.30 (L) 08/01/2017   LDLDIRECT 56.3 11/20/2012   LDLCALC 96 08/01/2017   ALT 27 08/01/2017   AST 38 (H) 08/01/2017   NA 137 07/12/2018   K 4.1 07/12/2018   CL 102 07/12/2018   CREATININE 0.94 07/12/2018   BUN 21 07/12/2018   CO2 28 07/12/2018   TSH 1.37 08/01/2017   PSA 0.70 08/01/2017   HGBA1C 7.8 (H) 07/12/2018   MICROALBUR 1.7 08/01/2017    No results found.  Assessment & Plan:   There are no diagnoses linked to this encounter.   No orders of the defined types were placed in this encounter.    Follow-up: No follow-ups on file.  Walker Kehr, MD

## 2018-07-18 NOTE — Assessment & Plan Note (Signed)
Wt Readings from Last 3 Encounters:  07/18/18 260 lb (117.9 kg)  04/07/18 255 lb (115.7 kg)  12/06/17 259 lb (117.5 kg)

## 2018-07-18 NOTE — Patient Instructions (Signed)

## 2018-07-18 NOTE — Assessment & Plan Note (Signed)
12/19 CT calcium scoring test was offered

## 2018-07-18 NOTE — Assessment & Plan Note (Addendum)
Worse due to recent steroids Amaryl, metformin, Alloglipt- Pioglitazone = Oseni Pt declined med changes Loose wt, improve diet CT calcium scoring test was offered

## 2018-09-01 ENCOUNTER — Other Ambulatory Visit: Payer: Self-pay | Admitting: Internal Medicine

## 2018-09-13 ENCOUNTER — Other Ambulatory Visit: Payer: Self-pay | Admitting: Internal Medicine

## 2018-10-13 DIAGNOSIS — M722 Plantar fascial fibromatosis: Secondary | ICD-10-CM

## 2018-11-23 ENCOUNTER — Ambulatory Visit: Payer: PRIVATE HEALTH INSURANCE | Admitting: Podiatry

## 2018-11-24 ENCOUNTER — Ambulatory Visit: Payer: Self-pay

## 2018-11-24 NOTE — Telephone Encounter (Signed)
Incoming call from Patients wife with questions  about Sx. 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  Son in law lives with them.  He has been exposed to someone with confirmed  Dx.  Wife reports Patient has had a cough for 5 to6 days.  Non productive.  Yesterday  Patient just layed around all day.  Had no appetite.  Patient is a diabetic and has had headaches.  Temperature today is 99.5, yesterday 100.Patient Denies SOB.  Sx.  Began yesterday.  Has muscle Soreness.  Provided care advice and provided contat number for [facility] Department.  Wife voices understanding.   Reason for Disposition . 1] COVID-19 infection diagnosed or suspected AND [2] mild symptoms (fever, cough) AND [2] no trouble breathing or other complications  Answer Assessment - Initial Assessment Questions 1. COVID-19 DIAGNOSIS: "Who made your Coronavirus (COVID-19) diagnosis?" "Was it confirmed by a positive lab test?" If not diagnosed by a HCP, ask "Are there lots of cases (community spread) where you live?" (See public health department website, if unsure)   * MAJOR community spread: high number of cases; numbers of cases are increasing; many people hospitalized.   * MINOR community spread: low number of cases; not increasing; few or no people hospitalized     *No Answer* 2. ONSET: "When did the COVID-19 symptoms start?"       Yesterday 3. WORST SYMPTOM: "What is your worst symptom?" (e.g., cough, fever, shortness of breath, muscle aches)     Headache sore all over 4. COUGH: "How bad is the cough?"       mild 5. FEVER: "Do you have a fever?" If so, ask: "What is your temperature, how was it measured, and when did it start?"     99.5 6. RESPIRATORY STATUS: "Describe your breathing?" (e.g., shortness of breath, wheezing, unable to speak)      Breathing is normal  7. BETTER-SAME-WORSE: "Are you getting better, staying the same or getting worse compared to yesterday?"  If getting worse, ask, "In what way?"   The same 8. HIGH RISK DISEASE: "Do you have any chronic medical problems?" (e.g., asthma, heart or lung disease, weak immune system, etc.)     Diabetic,  Didn't eat super last night 9. PREGNANCY: "Is there any chance you are pregnant?" "When was your last menstrual period?"     10. OTHER SYMPTOMS: "Do you have any other symptoms?"  (e.g., runny nose, headache, sore throat, loss of smell)       Headache for 2 days,  Protocols used: CORONAVIRUS (COVID-19) DIAGNOSED OR SUSPECTED-A-AH

## 2018-11-27 ENCOUNTER — Encounter: Payer: Self-pay | Admitting: Internal Medicine

## 2018-11-27 ENCOUNTER — Ambulatory Visit: Payer: Self-pay | Admitting: *Deleted

## 2018-11-27 ENCOUNTER — Ambulatory Visit (INDEPENDENT_AMBULATORY_CARE_PROVIDER_SITE_OTHER): Payer: PRIVATE HEALTH INSURANCE | Admitting: Internal Medicine

## 2018-11-27 ENCOUNTER — Other Ambulatory Visit: Payer: Self-pay

## 2018-11-27 DIAGNOSIS — E118 Type 2 diabetes mellitus with unspecified complications: Secondary | ICD-10-CM | POA: Diagnosis not present

## 2018-11-27 DIAGNOSIS — I10 Essential (primary) hypertension: Secondary | ICD-10-CM

## 2018-11-27 DIAGNOSIS — R509 Fever, unspecified: Secondary | ICD-10-CM | POA: Diagnosis not present

## 2018-11-27 NOTE — Telephone Encounter (Signed)
Patient scheduled.

## 2018-11-27 NOTE — Assessment & Plan Note (Signed)
Continue Amaryl, metformin, Oseni

## 2018-11-27 NOTE — Telephone Encounter (Signed)
Patient returned call please call back

## 2018-11-27 NOTE — Telephone Encounter (Signed)
LVM for patient to call back. ?

## 2018-11-27 NOTE — Progress Notes (Signed)
Virtual Visit via Telephone Note  I connected with Gary Fowler on 11/27/18 at  3:20 PM EDT by telephone and verified that I am speaking with the correct person using two identifiers.   I discussed the limitations, risks, security and privacy concerns of performing an evaluation and management service by telephone and the availability of in person appointments. I also discussed with the patient that there may be a patient responsible charge related to this service. The patient expressed understanding and agreed to proceed.   History of Present Illness:   There is no has been sick with low-grade fever since last Thursday.  His son-in-law was exposed to someone with coronavirus prior to that.  Gary Fowler's son-in-law and his daughter living with them.  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  Gary Fowler denies having cough, sore throat, muscle aches.  He has been confined to his room, getting outside occasionally.  Some mild low back pain has developed fairly recently. Observations/Objective:  Gary Fowler is in no acute distress.  His temperature has been ranging between 99.2-99.9 Assessment and Plan:  See plan Follow Up Instructions:    I discussed the assessment and treatment plan with the patient. The patient was provided an opportunity to ask questions and all were answered. The patient agreed with the plan and demonstrated an understanding of the instructions.   The patient was advised to call back or seek an in-person evaluation if the symptoms worsen or if the condition fails to improve as anticipated.  I provided 20 minutes of non-face-to-face time during this encounter.   Walker Kehr, MD

## 2018-11-27 NOTE — Telephone Encounter (Signed)
Summary: symptoms   Wife called - pt has been running temp of 99 over the past 3 days. He has body aches. They have talked to nurse and said it didn't sound like corona. They said he had flu like virus. Pt fever broke on Sunday, but overnight into Monday came back. Pt has temp of 99.9 and body aches, headache. Wife wants advice on if he should continue to treat at home or make appt. [phone]     Patient\'s wife is calling to report patient has had fever for 5 days- he had felt better yesterday- but today he is not feeling good again.COVID-19 precautions reviewed. Call to office- their FC line is down- skyped them and they have requested note to review and make virtual visit for patient- patient will be expecting call for scheduling. Reason for Disposition . Fever present > 3 days (72 hours)  Answer Assessment - Initial Assessment Questions 1. TEMPERATURE: "What is the most recent temperature?"  "How was it measured?"      99 .9 this morning, digit oral 2. ONSET: "When did the fever start?"      5 days- started Thursday- patient didn't feel well 3. SYMPTOMS: "Do you have any other symptoms besides the fever?"  (e.g., colds, headache, sore throat, earache, cough, rash, diarrhea, vomiting, abdominal pain)     Body aches, headaches, cold chills 4. CAUSE: If there are no symptoms, ask: "What do you think is causing the fever?"      n/a 5. 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 TREATMENT: "What have you done so far to treat this fever?" (e.g., medications)     Tylenol, fluids, bedrest 7. IMMUNOCOMPROMISE: "Do you have of the following: diabetes, HIV positive, splenectomy, cancer chemotherapy, chronic steroid treatment, transplant patient, etc."     Patient is diabetic 8. PREGNANCY: "Is there any chance you are pregnant?" "When was your  last menstrual period?"     n/a 9. TRAVEL: "Have you traveled out of the country in the last month?" (e.g., travel history, exposures)     No travel- no known exposures  Protocols used: FEVER-A-AH

## 2018-11-27 NOTE — Assessment & Plan Note (Signed)
His son-in-law was exposed to someone with coronavirus prior to that.  Bailen's son-in-law and his daughter living with them.  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  Keelon denies having cough, sore throat, muscle aches. Unable to rule out COVID-19.  Colton will remain in isolation for ongoing fever last 3 days before he can go back to work.  He will compliant with his nursing home policy is for [id number].

## 2018-11-27 NOTE — Assessment & Plan Note (Signed)
Continue bisoprolol/HCTZ

## 2018-11-28 NOTE — Telephone Encounter (Signed)
FYI

## 2018-12-07 ENCOUNTER — Ambulatory Visit: Payer: PRIVATE HEALTH INSURANCE | Admitting: Podiatry

## 2018-12-11 ENCOUNTER — Encounter: Payer: PRIVATE HEALTH INSURANCE | Admitting: Internal Medicine

## 2018-12-12 ENCOUNTER — Telehealth: Payer: Self-pay | Admitting: Internal Medicine

## 2018-12-12 NOTE — Telephone Encounter (Signed)
I received FMLA via fax for patient from [place].   LVM for patient to call back, need to speak with him about his dates out of work.   Spoke with patient, he was out of work 04/9 to 04/27.   Forms have been completed & placed in providers box to sign.

## 2018-12-15 DIAGNOSIS — Z0279 Encounter for issue of other medical certificate: Secondary | ICD-10-CM

## 2018-12-15 NOTE — Telephone Encounter (Signed)
Forms have been signed, faxed, sent to scan &charged for.   Original mailed to patient.

## 2018-12-23 ENCOUNTER — Other Ambulatory Visit: Payer: Self-pay | Admitting: Podiatry

## 2019-01-02 ENCOUNTER — Ambulatory Visit: Payer: PRIVATE HEALTH INSURANCE | Admitting: Podiatry

## 2019-01-02 ENCOUNTER — Other Ambulatory Visit: Payer: Self-pay

## 2019-01-02 ENCOUNTER — Encounter: Payer: Self-pay | Admitting: Podiatry

## 2019-01-02 DIAGNOSIS — M722 Plantar fascial fibromatosis: Secondary | ICD-10-CM | POA: Diagnosis not present

## 2019-01-02 MED ORDER — MELOXICAM 15 MG PO TABS: 15.0000 mg | ORAL_TABLET | Freq: Every day | ORAL | 3 refills | Status: DC

## 2019-01-02 MED ORDER — METHYLPREDNISOLONE 4 MG PO TBPK: ORAL_TABLET | ORAL | 0 refills | Status: DC

## 2019-01-02 NOTE — Patient Instructions (Signed)

## 2019-01-03 ENCOUNTER — Encounter: Payer: Self-pay | Admitting: Podiatry

## 2019-01-03 NOTE — Progress Notes (Signed)
He presents today to pick up his orthotics from [place] and he states that his heels just started to hurt again and he like to consider another injection.  Objective: Vital signs are stable he is alert oriented x3.  Pulses are palpable.  Neurologic sensorium is intact.  Degenerative flexors are intact.  Muscle strength is normal symmetrical.  He has pain on palpation medial calcaneal tubercle of the left heel but not as great as previously noted.  Assessment: I injected the left heel once again today started him on methylprednisolone and meloxicam.  He also received his orthotics from [place] I will follow-up with him in 1 month.

## 2019-02-06 ENCOUNTER — Ambulatory Visit: Payer: PRIVATE HEALTH INSURANCE | Admitting: Podiatry

## 2019-02-12 ENCOUNTER — Other Ambulatory Visit (INDEPENDENT_AMBULATORY_CARE_PROVIDER_SITE_OTHER): Payer: PRIVATE HEALTH INSURANCE

## 2019-02-12 DIAGNOSIS — E1165 Type 2 diabetes mellitus with hyperglycemia: Secondary | ICD-10-CM | POA: Diagnosis not present

## 2019-02-12 DIAGNOSIS — Z0000 Encounter for general adult medical examination without abnormal findings: Secondary | ICD-10-CM

## 2019-02-12 DIAGNOSIS — IMO0002 Reserved for concepts with insufficient information to code with codable children: Secondary | ICD-10-CM

## 2019-02-12 LAB — CBC WITH DIFFERENTIAL/PLATELET
Basophils Absolute: 0 10*3/uL (ref 0.0–0.1)
Basophils Relative: 0.4 % (ref 0.0–3.0)
Eosinophils Absolute: 0.1 10*3/uL (ref 0.0–0.7)
Eosinophils Relative: 1.6 % (ref 0.0–5.0)
HCT: 43.3 % (ref 39.0–52.0)
Hemoglobin: 14.6 g/dL (ref 13.0–17.0)
Lymphocytes Relative: 30.7 % (ref 12.0–46.0)
Lymphs Abs: 2.3 10*3/uL (ref 0.7–4.0)
MCHC: 33.7 g/dL (ref 30.0–36.0)
MCV: 87 fl (ref 78.0–100.0)
Monocytes Absolute: 0.5 10*3/uL (ref 0.1–1.0)
Monocytes Relative: 6 % (ref 3.0–12.0)
Neutro Abs: 4.6 10*3/uL (ref 1.4–7.7)
Neutrophils Relative %: 61.3 % (ref 43.0–77.0)
Platelets: 173 10*3/uL (ref 150.0–400.0)
RBC: 4.98 Mil/uL (ref 4.22–5.81)
RDW: 14.5 % (ref 11.5–15.5)
WBC: 7.6 10*3/uL (ref 4.0–10.5)

## 2019-02-12 LAB — BASIC METABOLIC PANEL
BUN: 15 mg/dL (ref 6–23)
CO2: 27 mEq/L (ref 19–32)
Calcium: 9.1 mg/dL (ref 8.4–10.5)
Chloride: 104 mEq/L (ref 96–112)
Creatinine, Ser: 0.91 mg/dL (ref 0.40–1.50)
GFR: 85.03 mL/min (ref >60.00)
Glucose, Bld: 147 mg/dL — ABNORMAL HIGH (ref 70–99)
Potassium: 3.8 mEq/L (ref 3.5–5.1)
Sodium: 143 mEq/L (ref 135–145)

## 2019-02-12 LAB — HEPATIC FUNCTION PANEL
ALT: 15 U/L (ref 0–53)
AST: 22 U/L (ref 0–37)
Albumin: 4.3 g/dL (ref 3.5–5.2)
Alkaline Phosphatase: 66 U/L (ref 39–117)
Bilirubin, Direct: 0.1 mg/dL (ref 0.0–0.3)
Total Bilirubin: 0.6 mg/dL (ref 0.2–1.2)
Total Protein: 6.8 g/dL (ref 6.0–8.3)

## 2019-02-12 LAB — LIPID PANEL
Cholesterol: 173 mg/dL (ref 0–200)
HDL: 37 mg/dL — ABNORMAL LOW (ref >39.00)
LDL Cholesterol: 100 mg/dL — ABNORMAL HIGH (ref 0–99)
NonHDL: 136.46
Total CHOL/HDL Ratio: 5
Triglycerides: 184 mg/dL — ABNORMAL HIGH (ref 0.0–149.0)
VLDL: 36.8 mg/dL (ref 0.0–40.0)

## 2019-02-12 LAB — URINALYSIS
Bilirubin Urine: NEGATIVE
Hgb urine dipstick: NEGATIVE
Leukocytes,Ua: NEGATIVE
Nitrite: NEGATIVE
Specific Gravity, Urine: 1.025 (ref 1.000–1.030)
Total Protein, Urine: NEGATIVE
Urine Glucose: 250 — AB
Urobilinogen, UA: 0.2 (ref 0.0–1.0)
pH: 6.5 (ref 5.0–8.0)

## 2019-02-12 LAB — PSA: PSA: 0.68 ng/mL (ref 0.10–4.00)

## 2019-02-12 LAB — TSH: TSH: 1.47 u[IU]/mL (ref 0.35–4.50)

## 2019-02-12 LAB — HEMOGLOBIN A1C: Hgb A1c MFr Bld: 7.6 % — ABNORMAL HIGH (ref 4.6–6.5)

## 2019-02-13 ENCOUNTER — Ambulatory Visit: Payer: PRIVATE HEALTH INSURANCE | Admitting: Podiatry

## 2019-02-13 ENCOUNTER — Encounter: Payer: Self-pay | Admitting: Podiatry

## 2019-02-13 ENCOUNTER — Other Ambulatory Visit: Payer: Self-pay

## 2019-02-13 VITALS — Temp 98.2°F

## 2019-02-13 DIAGNOSIS — M722 Plantar fascial fibromatosis: Secondary | ICD-10-CM

## 2019-02-13 NOTE — Progress Notes (Signed)
He presents today chief complaint of still a painful twinge in the left foot as he points to the heel.  Objective: Vital signs are stable he is alert and oriented x3 continue to wear his orthotics on a regular basis has pain to palpation medial [place] tubercle.  No other abnormalities are noted.  Assessment: Plantar fasciitis.  Plan: Inject the left heel again today with 10 mg Kenalog 5 mg Marcaine point maximal tenderness.  Tolerated procedure well.  Follow-up with him in a few months if necessary.

## 2019-02-15 ENCOUNTER — Encounter: Payer: Self-pay | Admitting: Internal Medicine

## 2019-02-15 ENCOUNTER — Ambulatory Visit (INDEPENDENT_AMBULATORY_CARE_PROVIDER_SITE_OTHER): Payer: PRIVATE HEALTH INSURANCE | Admitting: Internal Medicine

## 2019-02-15 ENCOUNTER — Other Ambulatory Visit: Payer: Self-pay

## 2019-02-15 DIAGNOSIS — E118 Type 2 diabetes mellitus with unspecified complications: Secondary | ICD-10-CM | POA: Diagnosis not present

## 2019-02-15 DIAGNOSIS — E785 Hyperlipidemia, unspecified: Secondary | ICD-10-CM

## 2019-02-15 DIAGNOSIS — G8929 Other chronic pain: Secondary | ICD-10-CM

## 2019-02-15 DIAGNOSIS — M5440 Lumbago with sciatica, unspecified side: Secondary | ICD-10-CM | POA: Diagnosis not present

## 2019-02-15 DIAGNOSIS — Z6838 Body mass index (BMI) 38.0-38.9, adult: Secondary | ICD-10-CM

## 2019-02-15 NOTE — Assessment & Plan Note (Signed)
back surgery

## 2019-02-15 NOTE — Progress Notes (Signed)
Subjective:  Patient ID: Gary Fowler, male    DOB: Nov 19, 1960  Age: 58 y.o. MRN: [id number]  CC: No chief complaint on file.   HPI BYNUM MCCULLARS presents for a well exam F/u DM, HTN, dyslipidemia  Outpatient Medications Prior to Visit  Medication Sig Dispense Refill  . aspirin 81 MG chewable tablet Chew 81 mg by mouth daily.      Marland Kitchen atorvastatin (LIPITOR) 20 MG tablet TAKE 1 TABLET BY MOUTH ONCE DAILY AT  6  PM 90 tablet 3  . bisoprolol-hydrochlorothiazide (ZIAC) 10-6.25 MG tablet TAKE 1 TABLET BY MOUTH ONCE DAILY 90 tablet 3  . fluorouracil (EFUDEX) 5 % cream APPLY A THIN LAYER ON THE SKIN TWICE DAILY TO AFFECTED AREA FOR 2 WEEKS  0  . glimepiride (AMARYL) 2 MG tablet TAKE 1 TABLET BY MOUTH TWICE DAILY. KEEP SCHEDULED APPOINTMENT IN DEC FOR FUTURE REFILLS 180 tablet 3  . ibuprofen (ADVIL,MOTRIN) 600 MG tablet Take 1 tablet (600 mg total) by mouth 2 (two) times daily as needed. for pain 180 tablet 0  . meloxicam (MOBIC) 15 MG tablet Take 1 tablet (15 mg total) by mouth daily. 30 tablet 3  . metFORMIN (GLUCOPHAGE) 1000 MG tablet TAKE 1 TABLET BY MOUTH TWICE DAILY WITH MEALS 180 tablet 3  . OSENI 25-30 MG TABS TAKE 1 TABLET BY MOUTH ONCE DAILY 30 tablet 11  . tadalafil (CIALIS) 20 MG tablet Take 1 tablet (20 mg total) by mouth as directed. Every 3 days as needed 30 tablet 5   No facility-administered medications prior to visit.     ROS: Review of Systems  Constitutional: Negative for appetite change, fatigue and unexpected weight change.  HENT: Negative for congestion, nosebleeds, sneezing, sore throat and trouble swallowing.   Eyes: Negative for itching and visual disturbance.  Respiratory: Negative for cough.   Cardiovascular: Negative for chest pain, palpitations and leg swelling.  Gastrointestinal: Negative for abdominal distention, blood in stool, diarrhea and nausea.  Genitourinary: Negative for frequency and hematuria.  Musculoskeletal: Negative for back pain, gait  problem, joint swelling and neck pain.  Skin: Negative for rash.  Neurological: Negative for dizziness, tremors, speech difficulty and weakness.  Psychiatric/Behavioral: Negative for agitation, dysphoric mood and sleep disturbance. The patient is not nervous/anxious.     Objective:  BP 128/82 (BP Location: Left Arm, Patient Position: Sitting, Cuff Size: Large)   Pulse 62   Temp 98.1 F (36.7 C) (Oral)   Ht 5\' 9"  (1.753 m)   Wt 251 lb (113.9 kg)   SpO2 97%   BMI 37.07 kg/m   BP Readings from Last 3 Encounters:  02/15/19 128/82  07/18/18 128/80  04/07/18 126/78    Wt Readings from Last 3 Encounters:  02/15/19 251 lb (113.9 kg)  07/18/18 260 lb (117.9 kg)  04/07/18 255 lb (115.7 kg)    Physical Exam Constitutional:      General: He is not in acute distress.    Appearance: He is well-developed.     Comments: NAD  Eyes:     Conjunctiva/sclera: Conjunctivae normal.     Pupils: Pupils are equal, round, and reactive to light.  Neck:     Musculoskeletal: Normal range of motion.     Thyroid: No thyromegaly.     Vascular: No JVD.  Cardiovascular:     Rate and Rhythm: Normal rate and regular rhythm.     Heart sounds: Normal heart sounds. No murmur. No friction rub. No gallop.   Pulmonary:  Effort: Pulmonary effort is normal. No respiratory distress.     Breath sounds: Normal breath sounds. No wheezing or rales.  Chest:     Chest wall: No tenderness.  Abdominal:     General: Bowel sounds are normal. There is no distension.     Palpations: Abdomen is soft. There is no mass.     Tenderness: There is no abdominal tenderness. There is no guarding or rebound.  Musculoskeletal: Normal range of motion.        General: No tenderness.  Lymphadenopathy:     Cervical: No cervical adenopathy.  Skin:    General: Skin is warm and dry.     Findings: No rash.  Neurological:     Mental Status: He is alert and oriented to person, place, and time.     Cranial Nerves: No cranial nerve  deficit.     Motor: No abnormal muscle tone.     Coordination: Coordination normal.     Gait: Gait normal.     Deep Tendon Reflexes: Reflexes are normal and symmetric.  Psychiatric:        Behavior: Behavior normal.        Thought Content: Thought content normal.        Judgment: Judgment normal.     Lab Results  Component Value Date   WBC 7.6 02/12/2019   HGB 14.6 02/12/2019   HCT 43.3 02/12/2019   PLT 173.0 02/12/2019   GLUCOSE 147 (H) 02/12/2019   CHOL 173 02/12/2019   TRIG 184.0 (H) 02/12/2019   HDL 37.00 (L) 02/12/2019   LDLDIRECT 56.3 11/20/2012   LDLCALC 100 (H) 02/12/2019   ALT 15 02/12/2019   AST 22 02/12/2019   NA 143 02/12/2019   K 3.8 02/12/2019   CL 104 02/12/2019   CREATININE 0.91 02/12/2019   BUN 15 02/12/2019   CO2 27 02/12/2019   TSH 1.47 02/12/2019   PSA 0.68 02/12/2019   HGBA1C 7.6 (H) 02/12/2019   MICROALBUR 1.7 08/01/2017    No results found.  Assessment & Plan:   There are no diagnoses linked to this encounter.   No orders of the defined types were placed in this encounter.    Follow-up: No follow-ups on file.  Walker Kehr, MD

## 2019-02-15 NOTE — Assessment & Plan Note (Signed)
On Lipitor 

## 2019-02-15 NOTE — Assessment & Plan Note (Signed)
Amaryl, metformin, Alloglipt- Pioglitazone = Oseni 12/19 CT calcium scoring test was offered

## 2019-02-15 NOTE — Assessment & Plan Note (Signed)
Wt Readings from Last 3 Encounters:  02/15/19 251 lb (113.9 kg)  07/18/18 260 lb (117.9 kg)  04/07/18 255 lb (115.7 kg)

## 2019-03-27 ENCOUNTER — Ambulatory Visit: Payer: PRIVATE HEALTH INSURANCE | Admitting: Podiatry

## 2019-04-22 ENCOUNTER — Other Ambulatory Visit: Payer: Self-pay | Admitting: Internal Medicine

## 2019-06-22 ENCOUNTER — Other Ambulatory Visit (INDEPENDENT_AMBULATORY_CARE_PROVIDER_SITE_OTHER): Payer: PRIVATE HEALTH INSURANCE

## 2019-06-22 DIAGNOSIS — IMO0002 Reserved for concepts with insufficient information to code with codable children: Secondary | ICD-10-CM

## 2019-06-22 DIAGNOSIS — E1165 Type 2 diabetes mellitus with hyperglycemia: Secondary | ICD-10-CM

## 2019-06-22 LAB — HEMOGLOBIN A1C: Hgb A1c MFr Bld: 8.4 % — ABNORMAL HIGH (ref 4.6–6.5)

## 2019-06-22 LAB — BASIC METABOLIC PANEL
BUN: 16 mg/dL (ref 6–23)
CO2: 27 mEq/L (ref 19–32)
Calcium: 9 mg/dL (ref 8.4–10.5)
Chloride: 100 mEq/L (ref 96–112)
Creatinine, Ser: 0.89 mg/dL (ref 0.40–1.50)
GFR: 87.14 mL/min (ref >60.00)
Glucose, Bld: 220 mg/dL — ABNORMAL HIGH (ref 70–99)
Potassium: 3.8 mEq/L (ref 3.5–5.1)
Sodium: 137 mEq/L (ref 135–145)

## 2019-06-26 ENCOUNTER — Encounter: Payer: Self-pay | Admitting: Internal Medicine

## 2019-06-26 ENCOUNTER — Ambulatory Visit (INDEPENDENT_AMBULATORY_CARE_PROVIDER_SITE_OTHER): Payer: PRIVATE HEALTH INSURANCE | Admitting: Internal Medicine

## 2019-06-26 ENCOUNTER — Other Ambulatory Visit: Payer: Self-pay

## 2019-06-26 DIAGNOSIS — I10 Essential (primary) hypertension: Secondary | ICD-10-CM | POA: Diagnosis not present

## 2019-06-26 DIAGNOSIS — E118 Type 2 diabetes mellitus with unspecified complications: Secondary | ICD-10-CM | POA: Diagnosis not present

## 2019-06-26 DIAGNOSIS — Z6838 Body mass index (BMI) 38.0-38.9, adult: Secondary | ICD-10-CM

## 2019-06-26 DIAGNOSIS — E785 Hyperlipidemia, unspecified: Secondary | ICD-10-CM

## 2019-06-26 MED ORDER — METFORMIN HCL 1000 MG PO TABS: 1000.0000 mg | ORAL_TABLET | Freq: Two times a day (BID) | ORAL | 3 refills | Status: DC

## 2019-06-26 NOTE — Assessment & Plan Note (Signed)
Lab Results  Component Value Date   HGBA1C 8.4 (H) 06/22/2019

## 2019-06-26 NOTE — Patient Instructions (Signed)
Hold Glimepiride if fasting  These suggestions will probably help you to improve your metabolism if you are not overweight and to lose weight if you are overweight: 1.  Reduce your consumption of sugars and starches.  Eliminate high fructose corn syrup from your diet.  Reduce your consumption of processed foods.  For desserts try to have seasonal fruits, berries, nuts, cheeses or dark chocolate with more than 70% cacao. 2.  Do not snack 3.  You do not have to eat breakfast.  If you choose to have breakfast-eat plain greek yogurt, eggs, oatmeal (without sugar) 4.  Drink water, freshly brewed unsweetened tea (green, black or herbal) or coffee.  Do not drink sodas including diet sodas , juices, beverages sweetened with artificial sweeteners. 5.  Reduce your consumption of refined grains. 6.  Avoid protein drinks such as Optifast, Slim fast etc. Eat chicken, fish, meat, dairy and beans for your sources of protein 7.  Natural unprocessed fats like cold pressed virgin olive oil, butter, coconut oil are good for you.  Eat avocados 8.  Increase your consumption of fiber.  Fruits, berries, vegetables, whole grains, flaxseeds, Chia seeds, beans, popcorn, nuts, oatmeal are good sources of fiber 9.  Use vinegar in your diet, i.e. apple cider vinegar, red wine or balsamic vinegar 10.  You can try fasting.  For example you can skip breakfast and lunch every other day (24-hour fast) 11.  Stress reduction, good night sleep, relaxation, meditation, yoga and other physical activity is likely to help you to maintain low weight too. 12.  If you drink alcohol, limit your alcohol intake to no more than 2 drinks a day.     Cabbage soup recipe that will not make you gain weight: Take 1 small head of cabbage, 1 average pack of celery, 4 green peppers, 4 onions, 2 cans diced tomatoes (they are not available without salt), salt and spices to taste.  Chop cabbage, celery, peppers and onions.  And tomatoes and 2-2.5 liters  (2.5 quarts) of water so that it would just cover the vegetables.  Bring to boil.  Add spices and salt.  Turn heat to low/medium and simmer for 20-25 minutes.  Naturally, you can make a smaller batch and change some of the ingredients.

## 2019-06-26 NOTE — Progress Notes (Signed)
Subjective:  Patient ID: Gary Fowler, male    DOB: 17-Aug-1960  Age: 58 y.o. MRN: [id number]  CC: No chief complaint on file.   HPI Govani Palenzuela Scroggins presents for DM, dyslipidemia, HTN f/u  Outpatient Medications Prior to Visit  Medication Sig Dispense Refill  . aspirin 81 MG chewable tablet Chew 81 mg by mouth daily.      Marland Kitchen atorvastatin (LIPITOR) 20 MG tablet TAKE 1 TABLET BY MOUTH ONCE DAILY AT  6  PM 90 tablet 3  . bisoprolol-hydrochlorothiazide (ZIAC) 10-6.25 MG tablet TAKE 1 TABLET BY MOUTH ONCE DAILY 90 tablet 3  . fluorouracil (EFUDEX) 5 % cream APPLY A THIN LAYER ON THE SKIN TWICE DAILY TO AFFECTED AREA FOR 2 WEEKS  0  . glimepiride (AMARYL) 2 MG tablet TAKE 1 TABLET BY MOUTH TWICE DAILY. KEEP SCHEDULED APPOINTMENT IN DEC FOR FUTURE REFILLS 180 tablet 3  . ibuprofen (ADVIL,MOTRIN) 600 MG tablet Take 1 tablet (600 mg total) by mouth 2 (two) times daily as needed. for pain 180 tablet 0  . meloxicam (MOBIC) 15 MG tablet Take 1 tablet (15 mg total) by mouth daily. 30 tablet 3  . OSENI 25-30 MG TABS TAKE 1 TABLET BY MOUTH ONCE DAILY 30 tablet 11  . tadalafil (CIALIS) 20 MG tablet Take 1 tablet (20 mg total) by mouth as directed. Every 3 days as needed 30 tablet 5  . metFORMIN (GLUCOPHAGE) 1000 MG tablet TAKE 1 TABLET BY MOUTH TWICE DAILY WITH MEALS 180 tablet 0   No facility-administered medications prior to visit.     ROS: Review of Systems  Constitutional: Negative for appetite change, fatigue and unexpected weight change.  HENT: Negative for congestion, nosebleeds, sneezing, sore throat and trouble swallowing.   Eyes: Negative for itching and visual disturbance.  Respiratory: Negative for cough.   Cardiovascular: Negative for chest pain, palpitations and leg swelling.  Gastrointestinal: Negative for abdominal distention, blood in stool, diarrhea and nausea.  Genitourinary: Negative for frequency and hematuria.  Musculoskeletal: Negative for back pain, gait problem,  joint swelling and neck pain.  Skin: Negative for rash.  Neurological: Negative for dizziness, tremors, speech difficulty and weakness.  Psychiatric/Behavioral: Negative for agitation, dysphoric mood, sleep disturbance and suicidal ideas. The patient is not nervous/anxious.     Objective:  BP 138/80 (BP Location: Left Arm, Patient Position: Sitting, Cuff Size: Large)   Pulse 64   Temp 97.9 F (36.6 C) (Oral)   Ht 5\' 9"  (1.753 m)   Wt 254 lb (115.2 kg)   SpO2 97%   BMI 37.51 kg/m   BP Readings from Last 3 Encounters:  06/26/19 138/80  02/15/19 128/82  07/18/18 128/80    Wt Readings from Last 3 Encounters:  06/26/19 254 lb (115.2 kg)  02/15/19 251 lb (113.9 kg)  07/18/18 260 lb (117.9 kg)    Physical Exam Constitutional:      General: He is not in acute distress.    Appearance: He is well-developed.     Comments: NAD  Eyes:     Conjunctiva/sclera: Conjunctivae normal.     Pupils: Pupils are equal, round, and reactive to light.  Neck:     Musculoskeletal: Normal range of motion.     Thyroid: No thyromegaly.     Vascular: No JVD.  Cardiovascular:     Rate and Rhythm: Normal rate and regular rhythm.     Heart sounds: Normal heart sounds. No murmur. No friction rub. No gallop.   Pulmonary:  Effort: Pulmonary effort is normal. No respiratory distress.     Breath sounds: Normal breath sounds. No wheezing or rales.  Chest:     Chest wall: No tenderness.  Abdominal:     General: Bowel sounds are normal. There is no distension.     Palpations: Abdomen is soft. There is no mass.     Tenderness: There is no abdominal tenderness. There is no guarding or rebound.  Musculoskeletal: Normal range of motion.        General: No tenderness.  Lymphadenopathy:     Cervical: No cervical adenopathy.  Skin:    General: Skin is warm and dry.     Findings: No rash.  Neurological:     Mental Status: He is alert and oriented to person, place, and time.     Cranial Nerves: No  cranial nerve deficit.     Motor: No abnormal muscle tone.     Coordination: Coordination normal.     Gait: Gait normal.     Deep Tendon Reflexes: Reflexes are normal and symmetric.  Psychiatric:        Behavior: Behavior normal.        Thought Content: Thought content normal.        Judgment: Judgment normal.     Lab Results  Component Value Date   WBC 7.6 02/12/2019   HGB 14.6 02/12/2019   HCT 43.3 02/12/2019   PLT 173.0 02/12/2019   GLUCOSE 220 (H) 06/22/2019   CHOL 173 02/12/2019   TRIG 184.0 (H) 02/12/2019   HDL 37.00 (L) 02/12/2019   LDLDIRECT 56.3 11/20/2012   LDLCALC 100 (H) 02/12/2019   ALT 15 02/12/2019   AST 22 02/12/2019   NA 137 06/22/2019   K 3.8 06/22/2019   CL 100 06/22/2019   CREATININE 0.89 06/22/2019   BUN 16 06/22/2019   CO2 27 06/22/2019   TSH 1.47 02/12/2019   PSA 0.68 02/12/2019   HGBA1C 8.4 (H) 06/22/2019   MICROALBUR 1.7 08/01/2017    No results found.  Assessment & Plan:   There are no diagnoses linked to this encounter.   Meds ordered this encounter  Medications  . metFORMIN (GLUCOPHAGE) 1000 MG tablet    Sig: Take 1 tablet (1,000 mg total) by mouth 2 (two) times daily with a meal.    Dispense:  180 tablet    Refill:  3     Follow-up: No follow-ups on file.  Walker Kehr, MD

## 2019-06-26 NOTE — Assessment & Plan Note (Addendum)
A1c is worse Discussed Improve diet, exercise

## 2019-06-26 NOTE — Assessment & Plan Note (Signed)
Lipitor 

## 2019-06-26 NOTE — Assessment & Plan Note (Signed)
Bisoprolol HCT 

## 2019-07-09 ENCOUNTER — Other Ambulatory Visit: Payer: Self-pay | Admitting: Internal Medicine

## 2019-08-28 ENCOUNTER — Other Ambulatory Visit: Payer: Self-pay | Admitting: Internal Medicine

## 2019-09-30 ENCOUNTER — Other Ambulatory Visit: Payer: Self-pay | Admitting: Internal Medicine

## 2019-10-26 ENCOUNTER — Other Ambulatory Visit (INDEPENDENT_AMBULATORY_CARE_PROVIDER_SITE_OTHER): Payer: PRIVATE HEALTH INSURANCE

## 2019-10-26 DIAGNOSIS — E1165 Type 2 diabetes mellitus with hyperglycemia: Secondary | ICD-10-CM | POA: Diagnosis not present

## 2019-10-26 DIAGNOSIS — IMO0002 Reserved for concepts with insufficient information to code with codable children: Secondary | ICD-10-CM

## 2019-10-26 LAB — BASIC METABOLIC PANEL
BUN: 14 mg/dL (ref 6–23)
CO2: 28 mEq/L (ref 19–32)
Calcium: 9 mg/dL (ref 8.4–10.5)
Chloride: 101 mEq/L (ref 96–112)
Creatinine, Ser: 0.88 mg/dL (ref 0.40–1.50)
GFR: 88.18 mL/min (ref >60.00)
Glucose, Bld: 187 mg/dL — ABNORMAL HIGH (ref 70–99)
Potassium: 4 mEq/L (ref 3.5–5.1)
Sodium: 138 mEq/L (ref 135–145)

## 2019-10-26 LAB — HEMOGLOBIN A1C: Hgb A1c MFr Bld: 8.5 % — ABNORMAL HIGH (ref 4.6–6.5)

## 2019-10-29 ENCOUNTER — Ambulatory Visit: Payer: PRIVATE HEALTH INSURANCE | Admitting: Internal Medicine

## 2019-10-29 ENCOUNTER — Encounter: Payer: Self-pay | Admitting: Internal Medicine

## 2019-10-29 ENCOUNTER — Other Ambulatory Visit: Payer: Self-pay

## 2019-10-29 DIAGNOSIS — I10 Essential (primary) hypertension: Secondary | ICD-10-CM

## 2019-10-29 DIAGNOSIS — Z6838 Body mass index (BMI) 38.0-38.9, adult: Secondary | ICD-10-CM

## 2019-10-29 DIAGNOSIS — E785 Hyperlipidemia, unspecified: Secondary | ICD-10-CM | POA: Diagnosis not present

## 2019-10-29 DIAGNOSIS — E118 Type 2 diabetes mellitus with unspecified complications: Secondary | ICD-10-CM

## 2019-10-29 MED ORDER — TADALAFIL 20 MG PO TABS: 20.0000 mg | ORAL_TABLET | ORAL | 5 refills | Status: DC

## 2019-10-29 NOTE — Assessment & Plan Note (Signed)
Lipitor 

## 2019-10-29 NOTE — Assessment & Plan Note (Signed)
Bisoprolol HCT 

## 2019-10-29 NOTE — Assessment & Plan Note (Signed)
No change 

## 2019-10-29 NOTE — Progress Notes (Signed)
Subjective:  Patient ID: Gary Fowler, male    DOB: 07-10-1961  Age: 59 y.o. MRN: [id number]  CC: No chief complaint on file.   HPI Gary Fowler presents for DM, HTN, dyslipidemia f/u  Outpatient Medications Prior to Visit  Medication Sig Dispense Refill  . aspirin 81 MG chewable tablet Chew 81 mg by mouth daily.      Marland Kitchen atorvastatin (LIPITOR) 20 MG tablet TAKE 1 TABLET BY MOUTH ONCE DAILY AT 6 PM 90 tablet 2  . bisoprolol-hydrochlorothiazide (ZIAC) 10-6.25 MG tablet Take 1 tablet by mouth once daily 90 tablet 1  . fluorouracil (EFUDEX) 5 % cream APPLY A THIN LAYER ON THE SKIN TWICE DAILY TO AFFECTED AREA FOR 2 WEEKS  0  . glimepiride (AMARYL) 2 MG tablet TAKE 1 TABLET BY MOUTH TWICE DAILY. KEEP SCHEDULED APPOINTMENT IN DEC FOR FUTURE REFILLS 180 tablet 3  . ibuprofen (ADVIL,MOTRIN) 600 MG tablet Take 1 tablet (600 mg total) by mouth 2 (two) times daily as needed. for pain 180 tablet 0  . meloxicam (MOBIC) 15 MG tablet Take 1 tablet (15 mg total) by mouth daily. 30 tablet 3  . metFORMIN (GLUCOPHAGE) 1000 MG tablet Take 1 tablet (1,000 mg total) by mouth 2 (two) times daily with a meal. 180 tablet 3  . OSENI 25-30 MG TABS Take 1 tablet by mouth once daily 30 tablet 11  . tadalafil (CIALIS) 20 MG tablet Take 1 tablet (20 mg total) by mouth as directed. Every 3 days as needed 30 tablet 5   No facility-administered medications prior to visit.    ROS: Review of Systems  Constitutional: Positive for fatigue. Negative for appetite change and unexpected weight change.  HENT: Negative for congestion, nosebleeds, sneezing, sore throat and trouble swallowing.   Eyes: Negative for itching and visual disturbance.  Respiratory: Negative for cough.   Cardiovascular: Negative for chest pain, palpitations and leg swelling.  Gastrointestinal: Negative for abdominal distention, blood in stool, diarrhea and nausea.  Genitourinary: Negative for frequency and hematuria.  Musculoskeletal:  Negative for back pain, gait problem, joint swelling and neck pain.  Skin: Negative for rash.  Neurological: Negative for dizziness, tremors, speech difficulty and weakness.  Psychiatric/Behavioral: Negative for agitation, dysphoric mood and sleep disturbance. The patient is not nervous/anxious.     Objective:  BP 132/80 (BP Location: Left Arm, Patient Position: Sitting, Cuff Size: Large)   Pulse 66   Temp 98.7 F (37.1 C) (Oral)   Ht 5\' 9"  (1.753 m)   Wt 254 lb (115.2 kg)   SpO2 97%   BMI 37.51 kg/m   BP Readings from Last 3 Encounters:  10/29/19 132/80  06/26/19 138/80  02/15/19 128/82    Wt Readings from Last 3 Encounters:  10/29/19 254 lb (115.2 kg)  06/26/19 254 lb (115.2 kg)  02/15/19 251 lb (113.9 kg)    Physical Exam Constitutional:      General: He is not in acute distress.    Appearance: He is well-developed.     Comments: NAD  Eyes:     Conjunctiva/sclera: Conjunctivae normal.     Pupils: Pupils are equal, round, and reactive to light.  Neck:     Thyroid: No thyromegaly.     Vascular: No JVD.  Cardiovascular:     Rate and Rhythm: Normal rate and regular rhythm.     Heart sounds: Normal heart sounds. No murmur. No friction rub. No gallop.   Pulmonary:     Effort: Pulmonary effort is normal.  No respiratory distress.     Breath sounds: Normal breath sounds. No wheezing or rales.  Chest:     Chest wall: No tenderness.  Abdominal:     General: Bowel sounds are normal. There is no distension.     Palpations: Abdomen is soft. There is no mass.     Tenderness: There is no abdominal tenderness. There is no guarding or rebound.  Musculoskeletal:        General: No tenderness. Normal range of motion.     Cervical back: Normal range of motion.  Lymphadenopathy:     Cervical: No cervical adenopathy.  Skin:    General: Skin is warm and dry.     Findings: No rash.  Neurological:     Mental Status: He is alert and oriented to person, place, and time.      Cranial Nerves: No cranial nerve deficit.     Motor: No abnormal muscle tone.     Coordination: Coordination normal.     Gait: Gait normal.     Deep Tendon Reflexes: Reflexes are normal and symmetric.  Psychiatric:        Behavior: Behavior normal.        Thought Content: Thought content normal.        Judgment: Judgment normal.     Lab Results  Component Value Date   WBC 7.6 02/12/2019   HGB 14.6 02/12/2019   HCT 43.3 02/12/2019   PLT 173.0 02/12/2019   GLUCOSE 187 (H) 10/26/2019   CHOL 173 02/12/2019   TRIG 184.0 (H) 02/12/2019   HDL 37.00 (L) 02/12/2019   LDLDIRECT 56.3 11/20/2012   LDLCALC 100 (H) 02/12/2019   ALT 15 02/12/2019   AST 22 02/12/2019   NA 138 10/26/2019   K 4.0 10/26/2019   CL 101 10/26/2019   CREATININE 0.88 10/26/2019   BUN 14 10/26/2019   CO2 28 10/26/2019   TSH 1.47 02/12/2019   PSA 0.68 02/12/2019   HGBA1C 8.5 (H) 10/26/2019   MICROALBUR 1.7 08/01/2017    No results found.  Assessment & Plan:

## 2019-10-29 NOTE — Assessment & Plan Note (Signed)
Worse Endo ref  Cont current Rx w/o change for now

## 2019-11-14 ENCOUNTER — Other Ambulatory Visit: Payer: Self-pay | Admitting: Internal Medicine

## 2019-12-05 ENCOUNTER — Other Ambulatory Visit: Payer: Self-pay

## 2019-12-07 ENCOUNTER — Ambulatory Visit: Payer: PRIVATE HEALTH INSURANCE | Admitting: Internal Medicine

## 2019-12-07 ENCOUNTER — Other Ambulatory Visit: Payer: Self-pay

## 2019-12-07 VITALS — BP 122/64 | HR 71 | Temp 98.1°F | Ht 69.0 in | Wt 257.4 lb

## 2019-12-07 DIAGNOSIS — E1165 Type 2 diabetes mellitus with hyperglycemia: Secondary | ICD-10-CM | POA: Insufficient documentation

## 2019-12-07 DIAGNOSIS — E785 Hyperlipidemia, unspecified: Secondary | ICD-10-CM | POA: Diagnosis not present

## 2019-12-07 DIAGNOSIS — E118 Type 2 diabetes mellitus with unspecified complications: Secondary | ICD-10-CM | POA: Diagnosis not present

## 2019-12-07 LAB — GLUCOSE, POCT (MANUAL RESULT ENTRY): POC Glucose: 264 mg/dl — AB (ref 70–99)

## 2019-12-07 MED ORDER — OSENI 25-30 MG PO TABS: 1.0000 | ORAL_TABLET | Freq: Every day | ORAL | 11 refills | Status: DC

## 2019-12-07 MED ORDER — METFORMIN HCL 1000 MG PO TABS: 1000.0000 mg | ORAL_TABLET | Freq: Two times a day (BID) | ORAL | 3 refills | Status: DC

## 2019-12-07 MED ORDER — ACCU-CHEK GUIDE VI STRP: 1.0000 | ORAL_STRIP | Freq: Two times a day (BID) | 12 refills | Status: DC

## 2019-12-07 MED ORDER — GLIPIZIDE 5 MG PO TABS: 5.0000 mg | ORAL_TABLET | Freq: Two times a day (BID) | ORAL | 1 refills | Status: DC

## 2019-12-07 NOTE — Patient Instructions (Addendum)
-   STOP Glimepiride  - Start Glipizide 5 mg, 1 tablet before Breakfast and 1 tablet before supper  - Continue Metformin 1000 mg Twice daily  - Continue Oseni daily    - Try and check your sugar before Breakfast and Supper   - Start with Vitamin D3 1000 iu daily    - Choose healthy, lower carb lower calorie snacks: toss salad, cooked vegetables, cottage cheese, peanut butter, low fat cheese / string cheese, lower sodium deli meat, tuna salad or chicken salad      HOW TO TREAT LOW BLOOD SUGARS (Blood sugar LESS THAN 70 MG/DL)  Please follow the RULE OF 15 for the treatment of hypoglycemia treatment (when your (blood sugars are less than 70 mg/dL)    STEP 1: Take 15 grams of carbohydrates when your blood sugar is low, which includes:   3-4 GLUCOSE TABS  OR  3-4 OZ OF JUICE OR REGULAR SODA OR  ONE TUBE OF GLUCOSE GEL     STEP 2: RECHECK blood sugar in 15 MINUTES STEP 3: If your blood sugar is still low at the 15 minute recheck --> then, go back to STEP 1 and treat AGAIN with another 15 grams of carbohydrates.

## 2019-12-07 NOTE — Progress Notes (Signed)
Name: Gary Fowler  MRN/ DOB: [id number], 1961/03/28   Age/ Sex: 59 y.o., male    PCP: Plotnikov, Evie Lacks, MD   Reason for Endocrinology Evaluation: Type 2 Diabetes Mellitus     Date of Initial Endocrinology Visit: 12/07/2019     PATIENT IDENTIFIER: Mr. Gary Fowler is a 59 y.o. male with a past medical history of HTN, T2DM, and dyslipidemia. The patient presented for initial endocrinology clinic visit on 12/07/2019 for consultative assistance with his diabetes management.    HPI: Gary Fowler was    Diagnosed with T2 DM in 2008 Prior Medications tried/Intolerance: N/a Currently checking blood sugars occasionally  Hypoglycemia episodes : no        Hemoglobin A1c has ranged from 6.3% in 2016, peaking at 8.5% in 2021. Patient required assistance for hypoglycemia: no  Patient has required hospitalization within the last 1 year from hyper or hypoglycemia: no  In terms of diet, the patient eats 2-3 meals a day, snacks 1 a day, does drink sugar-sweetened beverages.    HOME DIABETES REGIMEN: Glimepiride 2 mg BID Metformin 1000 mg twice daily Alogliptin-pioglitazone (Oseni)   Statin: Yes ACE-I/ARB: No Prior Diabetic Education: yes   METER DOWNLOAD SUMMARY: Did not bring meter    DIABETIC COMPLICATIONS: Microvascular complications:    Denies: CKD, retinopapthy , neuropathy   Last eye exam: Completed  2018   Macrovascular complications:    Denies: CAD, PVD, CVA   PAST HISTORY: Past Medical History:  Past Medical History:  Diagnosis Date  . ED (erectile dysfunction)   . Family history of prostate cancer   . Hyperlipidemia   . Hypertension   . Insomnia   . LBP (low back pain)   . Mood swings   . OSA (obstructive sleep apnea)   . Skin cancer of nose 2008   Left  . Type II or unspecified type diabetes mellitus without mention of complication, not stated as uncontrolled    Past Surgical History:  Past Surgical History:  Procedure Laterality Date   . arm fracture     with plate  . ARTHROSCOPY KNEE W/ DRILLING     rt. knee  . bone spur removal     lt. shoulder  . LUMBAR FUSION    . LUMBAR LAMINECTOMY    . SKIN CANCER EXCISION     face      Social History:  reports that he quit smoking about 30 years ago. His smokeless tobacco use includes chew. He reports that he does not drink alcohol or use drugs. Family History:  Family History  Problem Relation Age of Onset  . Cancer Father        Melanoma  . Hypertension Mother   . Crohn's disease Paternal Uncle   . Diabetes Maternal Grandfather   . Diabetes Paternal Grandfather      HOME MEDICATIONS: Allergies as of 12/07/2019      Reactions   Morphine Sulfate    REACTION: headache      Medication List       Accurate as of December 07, 2019  8:57 AM. If you have any questions, ask your nurse or doctor.        aspirin 81 MG chewable tablet Chew 81 mg by mouth daily.   atorvastatin 20 MG tablet Commonly known as: LIPITOR TAKE 1 TABLET BY MOUTH ONCE DAILY AT 6 PM   bisoprolol-hydrochlorothiazide 10-6.25 MG tablet Commonly known as: ZIAC Take 1 tablet by mouth once daily   CALCIUM/MAGNESIUM/ZINC  FORMULA PO Take by mouth.   fluorouracil 5 % cream Commonly known as: EFUDEX APPLY A THIN LAYER ON THE SKIN TWICE DAILY TO AFFECTED AREA FOR 2 WEEKS   glimepiride 2 MG tablet Commonly known as: AMARYL Take 1 tablet by mouth twice daily   ibuprofen 600 MG tablet Commonly known as: ADVIL Take 1 tablet (600 mg total) by mouth 2 (two) times daily as needed. for pain   meloxicam 15 MG tablet Commonly known as: MOBIC Take 1 tablet (15 mg total) by mouth daily.   metFORMIN 1000 MG tablet Commonly known as: GLUCOPHAGE Take 1 tablet (1,000 mg total) by mouth 2 (two) times daily with a meal.   multivitamin tablet Take 1 tablet by mouth daily.   Oseni 25-30 MG Tabs Generic drug: Alogliptin-Pioglitazone Take 1 tablet by mouth once daily   tadalafil 20 MG  tablet Commonly known as: CIALIS Take 1 tablet (20 mg total) by mouth as directed. Every 3 days as needed        ALLERGIES: Allergies  Allergen Reactions  . Morphine Sulfate     REACTION: headache     REVIEW OF SYSTEMS: A comprehensive ROS was conducted with the patient and is negative except as per HPI and below:  Review of Systems  Gastrointestinal: Negative for diarrhea and nausea.  Neurological: Negative for tingling and tremors.      OBJECTIVE:   VITAL SIGNS: BP 122/64 (BP Location: Left Arm, Patient Position: Sitting, Cuff Size: Large)   Pulse 71   Temp 98.1 F (36.7 C)   Ht 5\' 9"  (1.753 m)   Wt 257 lb 6.4 oz (116.8 kg)   SpO2 98%   BMI 38.01 kg/m    PHYSICAL EXAM:  General: Pt appears well and is in NAD  Hydration: Well-hydrated with moist mucous membranes and good skin turgor  HEENT: Head: Unremarkable with good dentition. Oropharynx clear without exudate.  Eyes: External eye exam normal without stare, lid lag or exophthalmos.  EOM intact.  PERRL.  Neck: General: Supple without adenopathy or carotid bruits. Thyroid: Thyroid size normal.  No goiter or nodules appreciated. No thyroid bruit.  Lungs: Clear with good BS bilat with no rales, rhonchi, or wheezes  Heart: RRR with normal S1 and S2 and no gallops; no murmurs; no rub  Abdomen: Normoactive bowel sounds, soft, nontender, without masses or organomegaly palpable  Extremities:  Lower extremities - No pretibial edema. No lesions.  Skin: Normal texture and temperature to palpation. No rash noted. No Acanthosis nigricans/skin tags. No lipohypertrophy.  Neuro: MS is good with appropriate affect, pt is alert and Ox3    DM foot exam:  12/07/2019  The skin of the feet is intact without sores or ulcerations. The pedal pulses are +1 on right and 1+ on left. The sensation is intact to a screening 5.07, 10 gram monofilament bilaterally    DATA REVIEWED:  Lab Results  Component Value Date   HGBA1C 8.5 (H)  10/26/2019   HGBA1C 8.4 (H) 06/22/2019   HGBA1C 7.6 (H) 02/12/2019   Lab Results  Component Value Date   MICROALBUR 1.7 08/01/2017   LDLCALC 100 (H) 02/12/2019   CREATININE 0.88 10/26/2019   Lab Results  Component Value Date   MICRALBCREAT 0.6 08/01/2017    Lab Results  Component Value Date   CHOL 173 02/12/2019   HDL 37.00 (L) 02/12/2019   LDLCALC 100 (H) 02/12/2019   LDLDIRECT 56.3 11/20/2012   TRIG 184.0 (H) 02/12/2019   CHOLHDL 5 02/12/2019  ASSESSMENT / PLAN / RECOMMENDATIONS:   1) Type 2 Diabetes Mellitus, poorly controlled, Without complications - Most recent A1c of 8.5 %. Goal A1c < 7.0 %.    Plan: GENERAL: I have discussed with the patient the pathophysiology of diabetes. We went over the natural progression of the disease. We stressed the importance of lifestyle changes including diet and exercise. I explained the complications associated with diabetes including retinopathy, nephropathy, neuropathy as well as increased risk of cardiovascular disease. We went over the benefit seen with glycemic control.    I explained to the patient that diabetic patients are at higher than normal risk for amputations.   I have advised him to avoid sugar sweetened beverages and to avoid snacks when possible.  Low-carb options for snacks wear discussed.  I am going to switch his glimepiride to glipizide for flexibility of titration.  He was encouraged to check glucose at home, we discussed the importance of having that data available to me.  MEDICATIONS: - STOP Glimepiride  - Start Glipizide 5 mg, 1 tablet before Breakfast and 1 tablet before supper  - Continue Metformin 1000 mg Twice daily  - Continue Oseni daily   EDUCATION / INSTRUCTIONS:  BG monitoring instructions: Patient is instructed to check his blood sugars 2 times a day, before breakfast and supper.  Call [facility] if: BG persistently < 70 or > 300. . I reviewed the Rule of 15 for the  treatment of hypoglycemia in detail with the patient. Literature supplied.   2) Diabetic complications:   Eye: Does not have known diabetic retinopathy.   Neuro/ Feet: Does not have known diabetic peripheral neuropathy.  Renal: Patient does not have known baseline CKD. He is not on an ACEI/ARB at present.  3) Lipids: Patient is on Atorvastatin half a tablet daily, due to arthralgias with full tablet.  LDL borderline.  I have advised him to start OTC vitamin D3 1000 IU daily.  I would recommend a trial of increasing this in the future if his LDL remains elevated.  We discussed the cardiovascular benefits of statins.    Follow-up in 3 months  Signed electronically by: Mack Guise, MD  [facility]  [facility] [address] [place], [place] [address] Phone: [phone] FAX: [phone]   CC: Cassandria Anger, MD [place] [address] Phone: [phone]  Fax: [phone]    Return to Endocrinology clinic as below: Future Appointments  Date Time Provider [place]  03/18/2020  8:10 AM Plotnikov, Evie Lacks, MD [facility] None

## 2020-01-13 ENCOUNTER — Other Ambulatory Visit: Payer: Self-pay | Admitting: Internal Medicine

## 2020-03-12 NOTE — Progress Notes (Signed)
Name: Gary Fowler  Age/ Sex: 59 y.o., male   MRN/ DOB: [id number], 11-23-1960     PCP: Cassandria Anger, MD   Reason for Endocrinology Evaluation: Type 2 Diabetes Mellitus  Initial Endocrine Consultative Visit: 12/07/2019    PATIENT IDENTIFIER: Gary Fowler is a 59 y.o. male with a past medical history of HTN, T2DM, and dyslipidemia. The patient has followed with Endocrinology clinic since 12/07/2019 for consultative assistance with management of his diabetes.  DIABETIC HISTORY:  Mr. Zaman was diagnosed with T2DM in 2008, . His hemoglobin A1c has ranged from 6.3% in 2016, peaking at 8.5% in 2021.  On his initial visit to our clinic his A1c was 8.5%. He was on Metformin, Glimepiride and Oseni.We switched Glimepiride to Glipizide and continued metformin and Oseni.    SUBJECTIVE:   During the last visit (12/07/2019): A1c 8.5%. We switched Glimepiride to Glipizide and continued metformin and Oseni   Today (03/13/2020): Mr. Stradling is here for a follow up on diabetes management.  He checks his blood sugars 2 times daily, preprandial to breakfast and supper. The patient has not had hypoglycemic episodes since the last clinic visit. No complaints today       HOME DIABETES REGIMEN:  Alogliptin-pioglitazone (Oseni) Metformin 1000 mg BID  Glipizide 5 mg BID      Statin: yes ACE-I/ARB: no    METER DOWNLOAD SUMMARY: Date range evaluated: 7/16-7/29/2021 Fingerstick Blood Glucose Tests = 10 Average Number Tests/Day = 0.7 Overall Mean FS Glucose = 174   BG Ranges: Low = 117 High = 218   Hypoglycemic Events/30 Days: BG < 50 = 0 Episodes of symptomatic severe hypoglycemia = 0   DIABETIC COMPLICATIONS: Microvascular complications:    Denies: CKD, retinopapthy , neuropathy   Last eye exam: Completed  2018   Macrovascular complications:    Denies: CAD, PVD, CVA   HISTORY:  Past Medical History:  Past Medical History:  Diagnosis Date    ED (erectile dysfunction)    Family history of prostate cancer    Hyperlipidemia    Hypertension    Insomnia    LBP (low back pain)    Mood swings    OSA (obstructive sleep apnea)    Skin cancer of nose 2008   Left   Type II or unspecified type diabetes mellitus without mention of complication, not stated as uncontrolled    Past Surgical History:  Past Surgical History:  Procedure Laterality Date   arm fracture     with plate   ARTHROSCOPY KNEE W/ DRILLING     rt. knee   bone spur removal     lt. shoulder   LUMBAR FUSION     LUMBAR LAMINECTOMY     SKIN CANCER EXCISION     face    Social History:  reports that he quit smoking about 30 years ago. His smokeless tobacco use includes chew. He reports that he does not drink alcohol and does not use drugs. Family History:  Family History  Problem Relation Age of Onset   Cancer Father        Melanoma   Hypertension Mother    Crohn's disease Paternal Uncle    Diabetes Maternal Grandfather    Diabetes Paternal Grandfather      HOME MEDICATIONS: Allergies as of 03/13/2020      Reactions   Morphine Sulfate    REACTION: headache      Medication List       Accurate as of March 13, 2020  9:13 AM. If you have any questions, ask your nurse or doctor.        STOP taking these medications   Oseni 25-30 MG Tabs Generic drug: Alogliptin-Pioglitazone Stopped by: Dorita Sciara, MD     TAKE these medications   Accu-Chek Guide test strip Generic drug: glucose blood 1 each by Other route 2 (two) times daily. Use as instructed   aspirin 81 MG chewable tablet Chew 81 mg by mouth daily.   atorvastatin 20 MG tablet Commonly known as: LIPITOR TAKE 1 TABLET BY MOUTH ONCE DAILY AT 6 PM   bisoprolol-hydrochlorothiazide 10-6.25 MG tablet Commonly known as: ZIAC Take 1 tablet by mouth once daily   CALCIUM/MAGNESIUM/ZINC FORMULA PO Take by mouth.   doxycycline 100 MG capsule Commonly known as:  VIBRAMYCIN Take 100 mg by mouth 2 (two) times daily.   fluorouracil 5 % cream Commonly known as: EFUDEX APPLY A THIN LAYER ON THE SKIN TWICE DAILY TO AFFECTED AREA FOR 2 WEEKS   glipiZIDE 5 MG tablet Commonly known as: Glucotrol Take 1 tablet (5 mg total) by mouth 2 (two) times daily before a meal.   ibuprofen 600 MG tablet Commonly known as: ADVIL Take 1 tablet (600 mg total) by mouth 2 (two) times daily as needed. for pain   meloxicam 15 MG tablet Commonly known as: MOBIC Take 1 tablet (15 mg total) by mouth daily.   metFORMIN 1000 MG tablet Commonly known as: GLUCOPHAGE Take 1 tablet (1,000 mg total) by mouth 2 (two) times daily with a meal.   multivitamin tablet Take 1 tablet by mouth daily.   pioglitazone 30 MG tablet Commonly known as: Actos Take 1 tablet (30 mg total) by mouth daily. Started by: Dorita Sciara, MD   Rybelsus 3 MG Tabs Generic drug: Semaglutide Take 3 mg by mouth daily. Started by: Dorita Sciara, MD   Rybelsus 7 MG Tabs Generic drug: Semaglutide Take 7 mg by mouth daily. Started by: Dorita Sciara, MD   tadalafil 20 MG tablet Commonly known as: CIALIS Take 1 tablet (20 mg total) by mouth as directed. Every 3 days as needed        OBJECTIVE:   Vital Signs: BP (!) 118/64 (BP Location: Left Arm, Patient Position: Sitting, Cuff Size: Large)    Pulse 80    Ht 5\' 9"  (1.753 m)    Wt (!) 250 lb (113.4 kg)    SpO2 98%    BMI 36.92 kg/m   Wt Readings from Last 3 Encounters:  03/13/20 (!) 250 lb (113.4 kg)  12/07/19 257 lb 6.4 oz (116.8 kg)  10/29/19 254 lb (115.2 kg)     Exam: General: Pt appears well and is in NAD  Neck: General: Supple without adenopathy. Thyroid: Thyroid size normal.  No goiter or nodules appreciated. No thyroid bruit.  Lungs: Clear with good BS bilat with no rales, rhonchi, or wheezes  Heart: RRR with normal S1 and S2 and no gallops; no murmurs; no rub  Abdomen: Normoactive bowel sounds, soft,  nontender, without masses or organomegaly palpable  Extremities: No pretibial edema.  Neuro: MS is good with appropriate affect, pt is alert and Ox3    DM foot exam:  12/07/2019  The skin of the feet is intact without sores or ulcerations. The pedal pulses are +1 on right and 1+ on left. The sensation is intact to a screening 5.07, 10 gram monofilament bilaterally    DATA REVIEWED:  Lab Results  Component Value Date   HGBA1C 7.3 (A) 03/13/2020   HGBA1C 8.5 (H) 10/26/2019   HGBA1C 8.4 (H) 06/22/2019   Lab Results  Component Value Date   MICROALBUR 1.7 08/01/2017   LDLCALC 100 (H) 02/12/2019   CREATININE 0.88 10/26/2019   Lab Results  Component Value Date   MICRALBCREAT 0.6 08/01/2017     Lab Results  Component Value Date   CHOL 173 02/12/2019   HDL 37.00 (L) 02/12/2019   LDLCALC 100 (H) 02/12/2019   LDLDIRECT 56.3 11/20/2012   TRIG 184.0 (H) 02/12/2019   CHOLHDL 5 02/12/2019         ASSESSMENT / PLAN / RECOMMENDATIONS:   1) Type 2 Diabetes Mellitus, Improving glycemic control, Without  complications - Most recent A1c of 7.3 %. Goal A1c < 7.0 %.      - A1c down from 8.5 %  - I have praised the pt on improved glycemic control. We discussed switching Oseni to a different regimen with more flexibly and possibly cheaper option as below     MEDICATIONS: - STOP Oseni  - Start Pioglitazone 30 mg, 1 tablet daily  - Start Rybelsus 3 mg , 1 tablet with Breakfast - if no side effects will increase to 7 mg after a month  - Continue  Glipizide 5 mg, 1 tablets before breakfast , 1 tablet before supper  - Continue Metformin 1000 , 1 tablet twice daily    EDUCATION / INSTRUCTIONS:  BG monitoring instructions: Patient is instructed to check his blood sugars 2 times a day, before breakfast and supper .  Call [facility] if: BG persistently < 70  I reviewed the Rule of 15 for the treatment of hypoglycemia in detail with the patient. Literature  supplied.    2) Diabetic complications:   Eye: Does not have known diabetic retinopathy.   Neuro/ Feet: Does not have known diabetic peripheral neuropathy .   Renal: Patient does not have known baseline CKD. He   is not on an ACEI/ARB at present.    3) Dyslipidemia :Patient is on Atorvastatin half a tablet daily, due to arthralgias with full tablet.  LDL borderline.  He was advised to start OTC vitamin D3 1000 IU daily on last visit.  Cardiovascular benefits of statins were discussed on that visit.     F/U in 3 months     Signed electronically by: Mack Guise, MD  [facility]  [facility] [address] [place], [place] [address] Phone: [phone] FAX: [phone]   CC: Cassandria Anger, MD [place] [address] Phone: [phone]  Fax: [phone]  Return to Endocrinology clinic as below: Future Appointments  Date Time Provider [place]  03/18/2020  8:10 AM Plotnikov, Evie Lacks, MD [facility] None  06/19/2020  7:30 AM [place] Leggitt, Melanie Crazier, MD [facility] None

## 2020-03-13 ENCOUNTER — Encounter: Payer: Self-pay | Admitting: Internal Medicine

## 2020-03-13 ENCOUNTER — Other Ambulatory Visit (INDEPENDENT_AMBULATORY_CARE_PROVIDER_SITE_OTHER): Payer: PRIVATE HEALTH INSURANCE

## 2020-03-13 ENCOUNTER — Telehealth: Payer: Self-pay

## 2020-03-13 ENCOUNTER — Ambulatory Visit: Payer: PRIVATE HEALTH INSURANCE | Admitting: Internal Medicine

## 2020-03-13 ENCOUNTER — Other Ambulatory Visit: Payer: Self-pay

## 2020-03-13 VITALS — BP 118/64 | HR 80 | Ht 69.0 in | Wt 250.0 lb

## 2020-03-13 DIAGNOSIS — E1165 Type 2 diabetes mellitus with hyperglycemia: Secondary | ICD-10-CM

## 2020-03-13 DIAGNOSIS — E118 Type 2 diabetes mellitus with unspecified complications: Secondary | ICD-10-CM

## 2020-03-13 DIAGNOSIS — IMO0002 Reserved for concepts with insufficient information to code with codable children: Secondary | ICD-10-CM

## 2020-03-13 DIAGNOSIS — Z0000 Encounter for general adult medical examination without abnormal findings: Secondary | ICD-10-CM | POA: Diagnosis not present

## 2020-03-13 LAB — URINALYSIS
Bilirubin Urine: NEGATIVE
Hgb urine dipstick: NEGATIVE
Ketones, ur: NEGATIVE
Leukocytes,Ua: NEGATIVE
Nitrite: NEGATIVE
Specific Gravity, Urine: 1.025 (ref 1.000–1.030)
Total Protein, Urine: NEGATIVE
Urine Glucose: NEGATIVE
Urobilinogen, UA: 0.2 (ref 0.0–1.0)
pH: 5.5 (ref 5.0–8.0)

## 2020-03-13 LAB — CBC WITH DIFFERENTIAL/PLATELET
Basophils Absolute: 0 10*3/uL (ref 0.0–0.1)
Basophils Relative: 0.3 % (ref 0.0–3.0)
Eosinophils Absolute: 0.1 10*3/uL (ref 0.0–0.7)
Eosinophils Relative: 1.1 % (ref 0.0–5.0)
HCT: 43.8 % (ref 39.0–52.0)
Hemoglobin: 14.9 g/dL (ref 13.0–17.0)
Lymphocytes Relative: 21.5 % (ref 12.0–46.0)
Lymphs Abs: 1.8 10*3/uL (ref 0.7–4.0)
MCHC: 33.9 g/dL (ref 30.0–36.0)
MCV: 86.7 fl (ref 78.0–100.0)
Monocytes Absolute: 0.4 10*3/uL (ref 0.1–1.0)
Monocytes Relative: 4.9 % (ref 3.0–12.0)
Neutro Abs: 5.9 10*3/uL (ref 1.4–7.7)
Neutrophils Relative %: 72.2 % (ref 43.0–77.0)
Platelets: 156 10*3/uL (ref 150.0–400.0)
RBC: 5.05 Mil/uL (ref 4.22–5.81)
RDW: 13.7 % (ref 11.5–15.5)
WBC: 8.2 10*3/uL (ref 4.0–10.5)

## 2020-03-13 LAB — BASIC METABOLIC PANEL
BUN: 13 mg/dL (ref 6–23)
CO2: 29 mEq/L (ref 19–32)
Calcium: 8.9 mg/dL (ref 8.4–10.5)
Chloride: 102 mEq/L (ref 96–112)
Creatinine, Ser: 0.88 mg/dL (ref 0.40–1.50)
GFR: 88.07 mL/min (ref >60.00)
Glucose, Bld: 193 mg/dL — ABNORMAL HIGH (ref 70–99)
Potassium: 4 mEq/L (ref 3.5–5.1)
Sodium: 138 mEq/L (ref 135–145)

## 2020-03-13 LAB — HEMOGLOBIN A1C: Hgb A1c MFr Bld: 7.6 % — ABNORMAL HIGH (ref 4.6–6.5)

## 2020-03-13 LAB — POCT GLYCOSYLATED HEMOGLOBIN (HGB A1C): Hemoglobin A1C: 7.3 % — AB (ref 4.0–5.6)

## 2020-03-13 LAB — HEPATIC FUNCTION PANEL
ALT: 12 U/L (ref 0–53)
AST: 18 U/L (ref 0–37)
Albumin: 4.3 g/dL (ref 3.5–5.2)
Alkaline Phosphatase: 66 U/L (ref 39–117)
Bilirubin, Direct: 0.1 mg/dL (ref 0.0–0.3)
Total Bilirubin: 0.7 mg/dL (ref 0.2–1.2)
Total Protein: 6.7 g/dL (ref 6.0–8.3)

## 2020-03-13 LAB — LIPID PANEL
Cholesterol: 130 mg/dL (ref 0–200)
HDL: 39 mg/dL — ABNORMAL LOW (ref >39.00)
LDL Cholesterol: 68 mg/dL (ref 0–99)
NonHDL: 90.97
Total CHOL/HDL Ratio: 3
Triglycerides: 117 mg/dL (ref 0.0–149.0)
VLDL: 23.4 mg/dL (ref 0.0–40.0)

## 2020-03-13 LAB — PSA: PSA: 0.55 ng/mL (ref 0.10–4.00)

## 2020-03-13 LAB — TSH: TSH: 1.68 u[IU]/mL (ref 0.35–4.50)

## 2020-03-13 MED ORDER — RYBELSUS 7 MG PO TABS: 7.0000 mg | ORAL_TABLET | Freq: Every day | ORAL | 3 refills | Status: DC

## 2020-03-13 MED ORDER — RYBELSUS 3 MG PO TABS: 3.0000 mg | ORAL_TABLET | Freq: Every day | ORAL | 0 refills | Status: DC

## 2020-03-13 MED ORDER — PIOGLITAZONE HCL 30 MG PO TABS: 30.0000 mg | ORAL_TABLET | Freq: Every day | ORAL | 6 refills | Status: DC

## 2020-03-13 NOTE — Patient Instructions (Addendum)
-   STOP Oseni  - Start Pioglitazone 30 mg, 1 tablet daily  - Start Rybelsus 3 mg , 1 tablet with Breakfast - if no side effects will increase to 7 mg after a month  - Continue Glipizide 5 mg, 1 tablets before breakfast , 1 tablet before supper  - Continue Metformin 1000 , 1 tablet twice daily    - Contact us if your sugars go below 70     HOW TO TREAT LOW BLOOD SUGARS (Blood sugar LESS THAN 70 MG/DL)  Please follow the RULE OF 15 for the treatment of hypoglycemia treatment (when your (blood sugars are less than 70 mg/dL)    STEP 1: Take 15 grams of carbohydrates when your blood sugar is low, which includes:   3-4 GLUCOSE TABS  OR  3-4 OZ OF JUICE OR REGULAR SODA OR  ONE TUBE OF GLUCOSE GEL     STEP 2: RECHECK blood sugar in 15 MINUTES STEP 3: If your blood sugar is still low at the 15 minute recheck --> then, go back to STEP 1 and treat AGAIN with another 15 grams of carbohydrates.

## 2020-03-18 ENCOUNTER — Other Ambulatory Visit: Payer: Self-pay

## 2020-03-18 ENCOUNTER — Encounter: Payer: Self-pay | Admitting: Internal Medicine

## 2020-03-18 ENCOUNTER — Ambulatory Visit (INDEPENDENT_AMBULATORY_CARE_PROVIDER_SITE_OTHER): Payer: PRIVATE HEALTH INSURANCE | Admitting: Internal Medicine

## 2020-03-18 VITALS — BP 116/62 | HR 60 | Temp 98.4°F | Ht 69.0 in | Wt 250.0 lb

## 2020-03-18 DIAGNOSIS — Z0000 Encounter for general adult medical examination without abnormal findings: Secondary | ICD-10-CM | POA: Diagnosis not present

## 2020-03-18 DIAGNOSIS — I10 Essential (primary) hypertension: Secondary | ICD-10-CM

## 2020-03-18 DIAGNOSIS — N320 Bladder-neck obstruction: Secondary | ICD-10-CM | POA: Insufficient documentation

## 2020-03-18 DIAGNOSIS — E785 Hyperlipidemia, unspecified: Secondary | ICD-10-CM

## 2020-03-18 DIAGNOSIS — E1165 Type 2 diabetes mellitus with hyperglycemia: Secondary | ICD-10-CM | POA: Diagnosis not present

## 2020-03-18 DIAGNOSIS — N529 Male erectile dysfunction, unspecified: Secondary | ICD-10-CM

## 2020-03-18 MED ORDER — TADALAFIL 5 MG PO TABS: 5.0000 mg | ORAL_TABLET | Freq: Every day | ORAL | 3 refills | Status: DC

## 2020-03-18 NOTE — Assessment & Plan Note (Addendum)
We discussed age appropriate health related issues, including available/recomended screening tests and vaccinations. We discussed a need for adhering to healthy diet and exercise. Labs/EKG were reviewed/ordered. All questions were answered. Shingrix adviced Colon due 2022 dr Ardis Hughs

## 2020-03-18 NOTE — Assessment & Plan Note (Signed)
Cialis 5 mg/d

## 2020-03-18 NOTE — Progress Notes (Signed)
Subjective:  Patient ID: Gary Fowler, male    DOB: 1961-01-12  Age: 59 y.o. MRN: [id number]  CC: No chief complaint on file.   HPI Gary Fowler presents for a well exam  Outpatient Medications Prior to Visit  Medication Sig Dispense Refill  . aspirin 81 MG chewable tablet Chew 81 mg by mouth daily.      Marland Kitchen atorvastatin (LIPITOR) 20 MG tablet TAKE 1 TABLET BY MOUTH ONCE DAILY AT 6 PM 90 tablet 2  . bisoprolol-hydrochlorothiazide (ZIAC) 10-6.25 MG tablet Take 1 tablet by mouth once daily 90 tablet 3  . CALCIUM/MAGNESIUM/ZINC FORMULA PO Take by mouth.    . doxycycline (VIBRAMYCIN) 100 MG capsule Take 100 mg by mouth 2 (two) times daily.    . fluorouracil (EFUDEX) 5 % cream APPLY A THIN LAYER ON THE SKIN TWICE DAILY TO AFFECTED AREA FOR 2 WEEKS  0  . glipiZIDE (GLUCOTROL) 5 MG tablet Take 1 tablet (5 mg total) by mouth 2 (two) times daily before a meal. 180 tablet 1  . glucose blood (ACCU-CHEK GUIDE) test strip 1 each by Other route 2 (two) times daily. Use as instructed 100 each 12  . ibuprofen (ADVIL,MOTRIN) 600 MG tablet Take 1 tablet (600 mg total) by mouth 2 (two) times daily as needed. for pain 180 tablet 0  . meloxicam (MOBIC) 15 MG tablet Take 1 tablet (15 mg total) by mouth daily. 30 tablet 3  . metFORMIN (GLUCOPHAGE) 1000 MG tablet Take 1 tablet (1,000 mg total) by mouth 2 (two) times daily with a meal. 180 tablet 3  . Multiple Vitamin (MULTIVITAMIN) tablet Take 1 tablet by mouth daily.    . pioglitazone (ACTOS) 30 MG tablet Take 1 tablet (30 mg total) by mouth daily. 30 tablet 6  . Semaglutide (RYBELSUS) 3 MG TABS Take 3 mg by mouth daily. 30 tablet 0  . Semaglutide (RYBELSUS) 7 MG TABS Take 7 mg by mouth daily. 30 tablet 3  . tadalafil (CIALIS) 20 MG tablet Take 1 tablet (20 mg total) by mouth as directed. Every 3 days as needed 30 tablet 5   No facility-administered medications prior to visit.    ROS: Review of Systems  Constitutional: Negative for appetite  change, fatigue and unexpected weight change.  HENT: Negative for congestion, nosebleeds, sneezing, sore throat and trouble swallowing.   Eyes: Negative for itching and visual disturbance.  Respiratory: Negative for cough.   Cardiovascular: Negative for chest pain, palpitations and leg swelling.  Gastrointestinal: Negative for abdominal distention, blood in stool, diarrhea and nausea.  Genitourinary: Negative for frequency and hematuria.  Musculoskeletal: Negative for back pain, gait problem, joint swelling and neck pain.  Skin: Negative for rash.  Neurological: Negative for dizziness, tremors, speech difficulty and weakness.  Psychiatric/Behavioral: Negative for agitation, dysphoric mood and sleep disturbance. The patient is not nervous/anxious.     Objective:  BP 116/62 (BP Location: Right Arm, Patient Position: Sitting, Cuff Size: Large)   Pulse 60   Temp 98.4 F (36.9 C) (Oral)   Ht 5\' 9"  (1.753 m)   Wt 250 lb (113.4 kg)   SpO2 97%   BMI 36.92 kg/m   BP Readings from Last 3 Encounters:  03/18/20 116/62  03/13/20 (!) 118/64  12/07/19 122/64    Wt Readings from Last 3 Encounters:  03/18/20 250 lb (113.4 kg)  03/13/20 (!) 250 lb (113.4 kg)  12/07/19 257 lb 6.4 oz (116.8 kg)    Physical Exam Constitutional:  General: He is not in acute distress.    Appearance: He is well-developed. He is obese.     Comments: NAD  Eyes:     Conjunctiva/sclera: Conjunctivae normal.     Pupils: Pupils are equal, round, and reactive to light.  Neck:     Thyroid: No thyromegaly.     Vascular: No JVD.  Cardiovascular:     Rate and Rhythm: Normal rate and regular rhythm.     Heart sounds: Normal heart sounds. No murmur heard.  No friction rub. No gallop.   Pulmonary:     Effort: Pulmonary effort is normal. No respiratory distress.     Breath sounds: Normal breath sounds. No wheezing or rales.  Chest:     Chest wall: No tenderness.  Abdominal:     General: Bowel sounds are normal.  There is no distension.     Palpations: Abdomen is soft. There is no mass.     Tenderness: There is no abdominal tenderness. There is no guarding or rebound.  Musculoskeletal:        General: No tenderness. Normal range of motion.     Cervical back: Normal range of motion.  Lymphadenopathy:     Cervical: No cervical adenopathy.  Skin:    General: Skin is warm and dry.     Findings: No rash.  Neurological:     Mental Status: He is alert and oriented to person, place, and time.     Cranial Nerves: No cranial nerve deficit.     Motor: No abnormal muscle tone.     Coordination: Coordination normal.     Gait: Gait normal.     Deep Tendon Reflexes: Reflexes are normal and symmetric.  Psychiatric:        Behavior: Behavior normal.        Thought Content: Thought content normal.        Judgment: Judgment normal.   obese  Lab Results  Component Value Date   WBC 8.2 03/13/2020   HGB 14.9 03/13/2020   HCT 43.8 03/13/2020   PLT 156.0 03/13/2020   GLUCOSE 193 (H) 03/13/2020   CHOL 130 03/13/2020   TRIG 117.0 03/13/2020   HDL 39.00 (L) 03/13/2020   LDLDIRECT 56.3 11/20/2012   LDLCALC 68 03/13/2020   ALT 12 03/13/2020   AST 18 03/13/2020   NA 138 03/13/2020   K 4.0 03/13/2020   CL 102 03/13/2020   CREATININE 0.88 03/13/2020   BUN 13 03/13/2020   CO2 29 03/13/2020   TSH 1.68 03/13/2020   PSA 0.55 03/13/2020   HGBA1C 7.6 (H) 03/13/2020   MICROALBUR 1.7 08/01/2017    No results found.  Assessment & Plan:     Follow-up: No follow-ups on file.  Walker Kehr, MD

## 2020-03-18 NOTE — Assessment & Plan Note (Signed)
Bisoprolol HCT

## 2020-03-18 NOTE — Assessment & Plan Note (Signed)
Labs

## 2020-04-03 NOTE — Telephone Encounter (Signed)
error 

## 2020-04-12 ENCOUNTER — Other Ambulatory Visit: Payer: Self-pay | Admitting: Internal Medicine

## 2020-06-19 ENCOUNTER — Other Ambulatory Visit: Payer: Self-pay

## 2020-06-19 ENCOUNTER — Encounter: Payer: Self-pay | Admitting: Internal Medicine

## 2020-06-19 ENCOUNTER — Ambulatory Visit (INDEPENDENT_AMBULATORY_CARE_PROVIDER_SITE_OTHER): Payer: PRIVATE HEALTH INSURANCE | Admitting: Internal Medicine

## 2020-06-19 VITALS — BP 124/84 | HR 65 | Ht 69.0 in | Wt 252.0 lb

## 2020-06-19 DIAGNOSIS — E1165 Type 2 diabetes mellitus with hyperglycemia: Secondary | ICD-10-CM | POA: Diagnosis not present

## 2020-06-19 DIAGNOSIS — N529 Male erectile dysfunction, unspecified: Secondary | ICD-10-CM | POA: Diagnosis not present

## 2020-06-19 LAB — POCT GLYCOSYLATED HEMOGLOBIN (HGB A1C): Hemoglobin A1C: 7 % — AB (ref 4.0–5.6)

## 2020-06-19 NOTE — Patient Instructions (Signed)
-   Continue  Pioglitazone 30 mg, 1 tablet daily  - Continue Rybelsus 7 mg , 1 tablet with Breakfast - Continue Glipizide 5 mg, 1 tablets before breakfast  - Continue Metformin 1000 mg , 1 tablet twice daily      HOW TO TREAT LOW BLOOD SUGARS (Blood sugar LESS THAN 70 MG/DL)  Please follow the RULE OF 15 for the treatment of hypoglycemia treatment (when your (blood sugars are less than 70 mg/dL)    STEP 1: Take 15 grams of carbohydrates when your blood sugar is low, which includes:   3-4 GLUCOSE TABS  OR  3-4 OZ OF JUICE OR REGULAR SODA OR  ONE TUBE OF GLUCOSE GEL     STEP 2: RECHECK blood sugar in 15 MINUTES STEP 3: If your blood sugar is still low at the 15 minute recheck --> then, go back to STEP 1 and treat AGAIN with another 15 grams of carbohydrates.

## 2020-06-19 NOTE — Progress Notes (Signed)
Name: Gary Fowler  Age/ Sex: 59 y.o., male   MRN/ DOB: [id number], 1960/09/22     PCP: Cassandria Anger, MD   Reason for Endocrinology Evaluation: Type 2 Diabetes Mellitus  Initial Endocrine Consultative Visit: 12/07/2019    PATIENT IDENTIFIER: Mr. Gary Fowler is a 59 y.o. male with a past medical history of HTN, T2DM, and dyslipidemia. The patient has followed with Endocrinology clinic since 12/07/2019 for consultative assistance with management of his diabetes.  DIABETIC HISTORY:  Mr. Caspers was diagnosed with T2DM in 2008, . His hemoglobin A1c has ranged from 6.3% in 2016, peaking at 8.5% in 2021.  On his initial visit to our clinic his A1c was 8.5%. He was on Metformin, Glimepiride and Oseni.We switched Glimepiride to Glipizide and continued metformin and Oseni.   By 02/2020 we switched Oseni to Pioglitazone and Rybelsus   SUBJECTIVE:   During the last visit (03/13/2020): A1c 7.3 %. We stopped Osenie, started pioglitazone and Rybelsus, continued  Glipizide and metformin  Today (06/19/2020): Mr. Graybeal is here for a follow up on diabetes management.  He checks his blood sugars 1 times daily, preprandial to breakfast and supper. The patient has not had hypoglycemic episodes since the last clinic visit. No complaints today     Has occasional loose stools at night bit nothing bothersome.    C/O Ed for the past 2 yrs. Cialis does not help. Described difficulty maintaining erections. No spontaneous erections.   Denies gynecomastia  Father with prostate ca  No tobacco use.    HOME DIABETES REGIMEN:  Metformin 1000 mg BID  Glipizide 5 mg daily  Pioglitazone 30 mg, 1 tablet daily  Rybelsus 7 mg      Statin: yes ACE-I/ARB: no    METER DOWNLOAD SUMMARY: Date range evaluated: 10/21-11/11/2019 Average Number Tests/Day = 1 Overall Mean FS Glucose = 183   BG Ranges: Low = 114 High = 498  Hypoglycemic Events/30 Days: BG < 50 = 0 Episodes of  symptomatic severe hypoglycemia = 0   DIABETIC COMPLICATIONS: Microvascular complications:    Denies: CKD, retinopapthy , neuropathy   Last eye exam: Completed  03/2020  Macrovascular complications:    Denies: CAD, PVD, CVA   HISTORY:  Past Medical History:  Past Medical History:  Diagnosis Date   ED (erectile dysfunction)    Family history of prostate cancer    Hyperlipidemia    Hypertension    Insomnia    LBP (low back pain)    Mood swings    OSA (obstructive sleep apnea)    Skin cancer of nose 2008   Left   Type II or unspecified type diabetes mellitus without mention of complication, not stated as uncontrolled    Past Surgical History:  Past Surgical History:  Procedure Laterality Date   arm fracture     with plate   ARTHROSCOPY KNEE W/ DRILLING     rt. knee   bone spur removal     lt. shoulder   LUMBAR FUSION     LUMBAR LAMINECTOMY     SKIN CANCER EXCISION     face    Social History:  reports that he quit smoking about 30 years ago. His smokeless tobacco use includes chew. He reports that he does not drink alcohol and does not use drugs. Family History:  Family History  Problem Relation Age of Onset   Cancer Father        Melanoma   Hypertension Mother    Crohn's disease  Paternal Uncle    Diabetes Maternal Grandfather    Diabetes Paternal Grandfather      HOME MEDICATIONS: Allergies as of 06/19/2020      Reactions   Morphine Sulfate    REACTION: headache      Medication List       Accurate as of June 19, 2020  7:51 AM. If you have any questions, ask your nurse or doctor.        Accu-Chek Guide test strip Generic drug: glucose blood 1 each by Other route 2 (two) times daily. Use as instructed   aspirin 81 MG chewable tablet Chew 81 mg by mouth daily.   atorvastatin 20 MG tablet Commonly known as: LIPITOR TAKE 1 TABLET BY MOUTH ONCE DAILY AT 6 PM   bisoprolol-hydrochlorothiazide 10-6.25 MG  tablet Commonly known as: ZIAC Take 1 tablet by mouth once daily   CALCIUM/MAGNESIUM/ZINC FORMULA PO Take by mouth.   doxycycline 100 MG capsule Commonly known as: VIBRAMYCIN Take 100 mg by mouth 2 (two) times daily.   fluorouracil 5 % cream Commonly known as: EFUDEX APPLY A THIN LAYER ON THE SKIN TWICE DAILY TO AFFECTED AREA FOR 2 WEEKS   glipiZIDE 5 MG tablet Commonly known as: Glucotrol Take 1 tablet (5 mg total) by mouth 2 (two) times daily before a meal.   ibuprofen 600 MG tablet Commonly known as: ADVIL Take 1 tablet (600 mg total) by mouth 2 (two) times daily as needed. for pain   meloxicam 15 MG tablet Commonly known as: MOBIC Take 1 tablet (15 mg total) by mouth daily.   metFORMIN 1000 MG tablet Commonly known as: GLUCOPHAGE Take 1 tablet (1,000 mg total) by mouth 2 (two) times daily with a meal.   multivitamin tablet Take 1 tablet by mouth daily.   pioglitazone 30 MG tablet Commonly known as: Actos Take 1 tablet (30 mg total) by mouth daily.   Rybelsus 7 MG Tabs Generic drug: Semaglutide Take 7 mg by mouth daily. What changed: Another medication with the same name was removed. Continue taking this medication, and follow the directions you see here. Changed by: Dorita Sciara, MD   tadalafil 5 MG tablet Commonly known as: Cialis Take 1 tablet (5 mg total) by mouth daily.        OBJECTIVE:   Vital Signs: BP 124/84    Pulse 65    Ht 5\' 9"  (1.753 m)    Wt 252 lb (114.3 kg)    SpO2 96%    BMI 37.21 kg/m   Wt Readings from Last 3 Encounters:  06/19/20 252 lb (114.3 kg)  03/18/20 250 lb (113.4 kg)  03/13/20 (!) 250 lb (113.4 kg)     Exam: General: Pt appears well and is in NAD  Neck: General: Supple without adenopathy. Thyroid: Thyroid size normal.  No goiter or nodules appreciated. No thyroid bruit.  Lungs: Clear with good BS bilat with no rales, rhonchi, or wheezes  Heart: RRR with normal S1 and S2 and no gallops; no murmurs; no rub   Abdomen: Normoactive bowel sounds, soft, nontender, without masses or organomegaly palpable  Extremities: No pretibial edema.  Neuro: MS is good with appropriate affect, pt is alert and Ox3    DM foot exam:  12/07/2019  The skin of the feet is intact without sores or ulcerations. The pedal pulses are +1 on right and 1+ on left. The sensation is intact to a screening 5.07, 10 gram monofilament bilaterally    DATA REVIEWED:  Lab Results  Component Value Date   HGBA1C 7.0 (A) 06/19/2020   HGBA1C 7.6 (H) 03/13/2020   HGBA1C 7.3 (A) 03/13/2020   Lab Results  Component Value Date   MICROALBUR 1.7 08/01/2017   LDLCALC 68 03/13/2020   CREATININE 0.88 03/13/2020   Lab Results  Component Value Date   MICRALBCREAT 0.6 08/01/2017     Lab Results  Component Value Date   CHOL 130 03/13/2020   HDL 39.00 (L) 03/13/2020   LDLCALC 68 03/13/2020   LDLDIRECT 56.3 11/20/2012   TRIG 117.0 03/13/2020   CHOLHDL 3 03/13/2020         ASSESSMENT / PLAN / RECOMMENDATIONS:   1) Type 2 Diabetes Mellitus,Optimally Controlled , Without  complications - Most recent A1c of 7.0 %. Goal A1c < 7.0 %.      - A1c at goal  - Tolerating medications without side effects - Somehoe he is using Glipizide less then prescribed, but that is ok for now  - NO changes today    MEDICATIONS: - Continue  Pioglitazone 30 mg, 1 tablet daily  - Continue Rybelsus 7 mg , 1 tablet with Breakfast  - Continue  Glipizide 5 mg, 1 tablets before breakfast - Continue Metformin 1000 mg , 1 tablet twice daily    EDUCATION / INSTRUCTIONS:  BG monitoring instructions: Patient is instructed to check his blood sugars 1 times a day, before breakfast  Call [facility] if: BG persistently < 70  I reviewed the Rule of 15 for the treatment of hypoglycemia in detail with the patient. Literature supplied.    2) Diabetic complications:   Eye: Does not have known diabetic retinopathy.   Neuro/ Feet:  Does not have known diabetic peripheral neuropathy .   Renal: Patient does not have known baseline CKD. He   is not on an ACEI/ARB at present.    3) Dyslipidemia :Patient is on Atorvastatin half a tablet daily, due to arthralgias with full tablet.  LDL borderline.  He was advised to start OTC vitamin D3 1000 IU daily on last visit.  Cardiovascular benefits of statins were discussed on that visit.   4) Erectile Dysfunction :  - Tried Cialis without help  - Discussed organic vs psychological causes, most likely secondary to DM and HTN - Will proceed with testosterone check  - No phlebotomist today, will return for labs   F/U in 6 months     Signed electronically by: Mack Guise, MD  [facility] Endocrinology  [facility] [address] [place], [place] [address] Phone: [phone] FAX: [phone]   CC: Cassandria Anger, MD [place] [address] Phone: [phone]  Fax: [phone]  Return to Endocrinology clinic as below: Future Appointments  Date Time Provider [place]  03/19/2021  7:50 AM Plotnikov, Evie Lacks, MD [facility] None

## 2020-06-26 ENCOUNTER — Other Ambulatory Visit: Payer: Self-pay | Admitting: Internal Medicine

## 2020-07-02 ENCOUNTER — Other Ambulatory Visit (INDEPENDENT_AMBULATORY_CARE_PROVIDER_SITE_OTHER): Payer: PRIVATE HEALTH INSURANCE

## 2020-07-02 ENCOUNTER — Other Ambulatory Visit: Payer: Self-pay

## 2020-07-02 DIAGNOSIS — N529 Male erectile dysfunction, unspecified: Secondary | ICD-10-CM | POA: Diagnosis not present

## 2020-07-02 LAB — LUTEINIZING HORMONE: LH: 4.32 m[IU]/mL (ref 1.50–9.30)

## 2020-07-02 LAB — TSH: TSH: 1.5 u[IU]/mL (ref 0.35–4.50)

## 2020-07-02 LAB — PROLACTIN: Prolactin: 5.7 ng/mL (ref 2.0–18.0)

## 2020-07-02 LAB — FOLLICLE STIMULATING HORMONE: FSH: 7 m[IU]/mL (ref 1.4–18.1)

## 2020-07-07 ENCOUNTER — Other Ambulatory Visit: Payer: Self-pay

## 2020-07-13 LAB — TESTOSTERONE FREE MS/DIALYSIS
Free Testosterone, Serum: 33 pg/mL — ABNORMAL LOW
Testosterone, Serum (Total): 367 ng/dL
Testosterone-% Free: 0.9 %

## 2020-08-27 ENCOUNTER — Other Ambulatory Visit: Payer: Self-pay | Admitting: Internal Medicine

## 2020-09-09 ENCOUNTER — Other Ambulatory Visit: Payer: Self-pay | Admitting: Internal Medicine

## 2020-09-09 MED ORDER — PIOGLITAZONE HCL 30 MG PO TABS
30.0000 mg | ORAL_TABLET | Freq: Every day | ORAL | 5 refills | Status: DC
Start: 2020-09-09 — End: 2021-04-13

## 2020-09-29 ENCOUNTER — Other Ambulatory Visit: Payer: Self-pay | Admitting: Internal Medicine

## 2020-12-13 ENCOUNTER — Other Ambulatory Visit: Payer: Self-pay | Admitting: Internal Medicine

## 2020-12-16 ENCOUNTER — Other Ambulatory Visit: Payer: Self-pay | Admitting: Internal Medicine

## 2020-12-19 ENCOUNTER — Encounter: Payer: Self-pay | Admitting: Internal Medicine

## 2020-12-19 ENCOUNTER — Ambulatory Visit: Payer: PRIVATE HEALTH INSURANCE | Admitting: Internal Medicine

## 2020-12-19 ENCOUNTER — Other Ambulatory Visit: Payer: Self-pay

## 2020-12-19 VITALS — BP 130/84 | HR 63 | Ht 69.0 in | Wt 248.4 lb

## 2020-12-19 DIAGNOSIS — E1165 Type 2 diabetes mellitus with hyperglycemia: Secondary | ICD-10-CM | POA: Diagnosis not present

## 2020-12-19 LAB — MICROALBUMIN / CREATININE URINE RATIO
Creatinine,U: 184.8 mg/dL
Microalb Creat Ratio: 0.6 mg/g (ref 0.0–30.0)
Microalb, Ur: 1.1 mg/dL (ref 0.0–1.9)

## 2020-12-19 LAB — POCT GLYCOSYLATED HEMOGLOBIN (HGB A1C): Hemoglobin A1C: 7.6 % — AB (ref 4.0–5.6)

## 2020-12-19 MED ORDER — RYBELSUS 14 MG PO TABS: 14.0000 mg | ORAL_TABLET | Freq: Every day | ORAL | 3 refills | Status: DC

## 2020-12-19 NOTE — Progress Notes (Signed)
Name: Gary Fowler  Age/ Sex: 60 y.o., male   MRN/ DOB: [id number], 1960/09/11     PCP: Cassandria Anger, MD   Reason for Endocrinology Evaluation: Type 2 Diabetes Mellitus  Initial Endocrine Consultative Visit: 12/07/2019    PATIENT IDENTIFIER: Gary Fowler is a 60 y.o. male with a past medical history of HTN, T2DM, and dyslipidemia. The patient has followed with Endocrinology clinic since 12/07/2019 for consultative assistance with management of his diabetes.  DIABETIC HISTORY:  Mr. Aguino was diagnosed with T2DM in 2008, . His hemoglobin A1c has ranged from 6.3% in 2016, peaking at 8.5% in 2021.  On his initial visit to our clinic his A1c was 8.5%. He was on Metformin, Glimepiride and Oseni.We switched Glimepiride to Glipizide and continued metformin and Oseni.   By 02/2020 we switched Oseni to Pioglitazone and Rybelsus   Testosterone checked due to complaints of ED 06/2020 with normal testosterone at 367 ng/dL    SUBJECTIVE:   During the last visit (06/19/2020): A1c 7.0 %. We continued pioglitazone,Rybelsus,  Glipizide and metformin     Today (12/19/2020): Mr. Kothari is here for a follow up on diabetes management.  He checks his blood sugars 1 times daily, preprandial to breakfast and supper. The patient has not had hypoglycemic episodes since the last clinic visit.    Forgets to take evening glycemic agents at times.   Denies nausea or vomiting    HOME DIABETES REGIMEN:  Metformin 1000 mg BID  Glipizide 5 mg daily  Pioglitazone 30 mg, 1 tablet daily  Rybelsus 7 mg      Statin: yes ACE-I/ARB: no    METER DOWNLOAD SUMMARY: Date range evaluated: 4/22-12/19/2020 Average Number Tests/Day = 0.6 Overall Mean FS Glucose = 179   BG Ranges: Low = 129 High = 235  Hypoglycemic Events/30 Days: BG < 50 = 0 Episodes of symptomatic severe hypoglycemia = 0   DIABETIC COMPLICATIONS: Microvascular complications:    Denies: CKD, retinopapthy ,  neuropathy   Last eye exam: Completed  03/2020  Macrovascular complications:    Denies: CAD, PVD, CVA   HISTORY:  Past Medical History:  Past Medical History:  Diagnosis Date  . ED (erectile dysfunction)   . Family history of prostate cancer   . Hyperlipidemia   . Hypertension   . Insomnia   . LBP (low back pain)   . Mood swings   . OSA (obstructive sleep apnea)   . Skin cancer of nose 2008   Left  . Type II or unspecified type diabetes mellitus without mention of complication, not stated as uncontrolled    Past Surgical History:  Past Surgical History:  Procedure Laterality Date  . arm fracture     with plate  . ARTHROSCOPY KNEE W/ DRILLING     rt. knee  . bone spur removal     lt. shoulder  . LUMBAR FUSION    . LUMBAR LAMINECTOMY    . SKIN CANCER EXCISION     face    Social History:  reports that he quit smoking about 31 years ago. His smokeless tobacco use includes chew. He reports that he does not drink alcohol and does not use drugs. Family History:  Family History  Problem Relation Age of Onset  . Cancer Father        Melanoma  . Hypertension Mother   . Crohn's disease Paternal Uncle   . Diabetes Maternal Grandfather   . Diabetes Paternal Grandfather  HOME MEDICATIONS: Allergies as of 12/19/2020      Reactions   Morphine Sulfate    REACTION: headache      Medication List       Accurate as of Dec 19, 2020  7:59 AM. If you have any questions, ask your nurse or doctor.        Accu-Chek Guide test strip Generic drug: glucose blood 1 each by Other route 2 (two) times daily. Use as instructed   aspirin 81 MG chewable tablet Chew 81 mg by mouth daily.   atorvastatin 20 MG tablet Commonly known as: LIPITOR TAKE 1 TABLET BY MOUTH ONCE DAILY AT 6 PM   bisoprolol-hydrochlorothiazide 10-6.25 MG tablet Commonly known as: ZIAC Take 1 tablet by mouth once daily   CALCIUM/MAGNESIUM/ZINC FORMULA PO Take by mouth.   doxycycline 100 MG  capsule Commonly known as: VIBRAMYCIN Take 100 mg by mouth 2 (two) times daily.   fluorouracil 5 % cream Commonly known as: EFUDEX APPLY A THIN LAYER ON THE SKIN TWICE DAILY TO AFFECTED AREA FOR 2 WEEKS   glipiZIDE 5 MG tablet Commonly known as: GLUCOTROL TAKE 1 TABLET BY MOUTH TWICE DAILY BEFORE MEAL   ibuprofen 600 MG tablet Commonly known as: ADVIL Take 1 tablet (600 mg total) by mouth 2 (two) times daily as needed. for pain   meloxicam 15 MG tablet Commonly known as: MOBIC Take 1 tablet (15 mg total) by mouth daily.   metFORMIN 1000 MG tablet Commonly known as: GLUCOPHAGE TAKE 1 TABLET BY MOUTH TWICE DAILY WITH MEALS   multivitamin tablet Take 1 tablet by mouth daily.   pioglitazone 30 MG tablet Commonly known as: Actos Take 1 tablet (30 mg total) by mouth daily.   Rybelsus 14 MG Tabs Generic drug: Semaglutide Take 14 mg by mouth daily. What changed:   medication strength  how much to take Changed by: Dorita Sciara, MD   tadalafil 5 MG tablet Commonly known as: Cialis Take 1 tablet (5 mg total) by mouth daily.        OBJECTIVE:   Vital Signs: BP 130/84   Pulse 63   Ht 5\' 9"  (1.753 m)   Wt 248 lb 6 oz (112.7 kg)   SpO2 99%   BMI 36.68 kg/m   Wt Readings from Last 3 Encounters:  12/19/20 248 lb 6 oz (112.7 kg)  06/19/20 252 lb (114.3 kg)  03/18/20 250 lb (113.4 kg)     Exam: General: Pt appears well and is in NAD  Neck: General: Supple without adenopathy. Thyroid: Thyroid size normal.  No goiter or nodules appreciated. No thyroid bruit.  Lungs: Clear with good BS bilat with no rales, rhonchi, or wheezes  Heart: RRR with normal S1 and S2 and no gallops; no murmurs; no rub  Abdomen: Normoactive bowel sounds, soft, nontender, without masses or organomegaly palpable  Extremities: No pretibial edema.  Neuro: MS is good with appropriate affect, pt is alert and Ox3    DM foot exam:  12/19/2020  The skin of the feet is intact without sores  or ulcerations. The pedal pulses are +1 on right and 1+ on left. The sensation is intact to a screening 5.07, 10 gram monofilament bilaterally    DATA REVIEWED:  Lab Results  Component Value Date   HGBA1C 7.6 (A) 12/19/2020   HGBA1C 7.0 (A) 06/19/2020   HGBA1C 7.6 (H) 03/13/2020   Results for JERMY, COUPER (MRN [id number]) as of 12/19/2020 13:40  Ref. Range 12/19/2020 08:01  Creatinine,U Latest Units: mg/dL 184.8  Microalb, Ur Latest Ref Range: 0.0 - 1.9 mg/dL 1.1  MICROALB/CREAT RATIO Latest Ref Range: 0.0 - 30.0 mg/g 0.6     ASSESSMENT / PLAN / RECOMMENDATIONS:   1) Type 2 Diabetes Mellitus,  Controlled , Without  complications - Most recent A1c of 7.6 %. Goal A1c < 7.0 %.     - Pt with hyperglycemia , discussed the importance of limiting snacks and reducing amount of CHO intake as much as possible.  - Will increase Rybelsus as below    MEDICATIONS: - Continue  Pioglitazone 30 mg, 1 tablet daily  - Increase Rybelsus 14 mg , 1 tablet with Breakfast  - Continue  Glipizide 5 mg, 1 tablets before breakfast - Continue Metformin 1000 mg , 1 tablet twice daily    EDUCATION / INSTRUCTIONS:  BG monitoring instructions: Patient is instructed to check his blood sugars 1 times a day, before breakfast  Call [facility] if: BG persistently < 70 . I reviewed the Rule of 15 for the treatment of hypoglycemia in detail with the patient. Literature supplied.    2) Diabetic complications:   Eye: Does not have known diabetic retinopathy.   Neuro/ Feet: Does not have known diabetic peripheral neuropathy .   Renal: Patient does not have known baseline CKD. He   is not on an ACEI/ARB at present.       F/U in 4 months     Signed electronically by: Mack Guise, MD  [facility]  [facility] [address] [place], [place] [address] Phone: [phone] FAX: [phone]   CC: Cassandria Anger, MD [place] [address] Phone: [phone]  Fax: [phone]  Return to Endocrinology clinic as below: Future Appointments  Date Time Provider [place]  03/19/2021  7:50 AM Plotnikov, Evie Lacks, MD [facility] None

## 2020-12-19 NOTE — Patient Instructions (Addendum)
-   Continue  Pioglitazone 30 mg, 1 tablet before breakfast  - Increase Rybelsus to 14  mg , 1 tablet before  Breakfast - Continue Glipizide 5 mg, 1 tablets before breakfast  - Continue Metformin 1000 mg , 1 tablet twice daily      HOW TO TREAT LOW BLOOD SUGARS (Blood sugar LESS THAN 70 MG/DL)  Please follow the RULE OF 15 for the treatment of hypoglycemia treatment (when your (blood sugars are less than 70 mg/dL)    STEP 1: Take 15 grams of carbohydrates when your blood sugar is low, which includes:   3-4 GLUCOSE TABS  OR  3-4 OZ OF JUICE OR REGULAR SODA OR  ONE TUBE OF GLUCOSE GEL     STEP 2: RECHECK blood sugar in 15 MINUTES STEP 3: If your blood sugar is still low at the 15 minute recheck --> then, go back to STEP 1 and treat AGAIN with another 15 grams of carbohydrates.

## 2021-01-15 ENCOUNTER — Other Ambulatory Visit: Payer: Self-pay | Admitting: Internal Medicine

## 2021-02-04 ENCOUNTER — Other Ambulatory Visit: Payer: Self-pay | Admitting: Internal Medicine

## 2021-02-13 ENCOUNTER — Other Ambulatory Visit: Payer: Self-pay | Admitting: Internal Medicine

## 2021-02-13 DIAGNOSIS — E1165 Type 2 diabetes mellitus with hyperglycemia: Secondary | ICD-10-CM

## 2021-03-08 ENCOUNTER — Other Ambulatory Visit: Payer: Self-pay | Admitting: Internal Medicine

## 2021-03-16 ENCOUNTER — Other Ambulatory Visit: Payer: No Typology Code available for payment source

## 2021-03-16 ENCOUNTER — Telehealth: Payer: Self-pay | Admitting: *Deleted

## 2021-03-16 DIAGNOSIS — Z125 Encounter for screening for malignant neoplasm of prostate: Secondary | ICD-10-CM

## 2021-03-16 DIAGNOSIS — I10 Essential (primary) hypertension: Secondary | ICD-10-CM

## 2021-03-16 DIAGNOSIS — Z0000 Encounter for general adult medical examination without abnormal findings: Secondary | ICD-10-CM

## 2021-03-16 DIAGNOSIS — E785 Hyperlipidemia, unspecified: Secondary | ICD-10-CM

## 2021-03-16 DIAGNOSIS — E1165 Type 2 diabetes mellitus with hyperglycemia: Secondary | ICD-10-CM

## 2021-03-16 NOTE — Telephone Encounter (Signed)
[  10:22 AM] Sanjuan Dame, i have the lab on the phone for you regarding a pt. the pt is scheduled for a physical on thursday & i guess they went in for labs but there is no order   Verified pt record. Pt is schedule for annual cpx. Pt has [facility]). Entered standard cpx lbs.Marland KitchenJohny Chess

## 2021-03-17 ENCOUNTER — Other Ambulatory Visit (INDEPENDENT_AMBULATORY_CARE_PROVIDER_SITE_OTHER): Payer: No Typology Code available for payment source

## 2021-03-17 DIAGNOSIS — I10 Essential (primary) hypertension: Secondary | ICD-10-CM | POA: Diagnosis not present

## 2021-03-17 DIAGNOSIS — Z125 Encounter for screening for malignant neoplasm of prostate: Secondary | ICD-10-CM | POA: Diagnosis not present

## 2021-03-17 DIAGNOSIS — E785 Hyperlipidemia, unspecified: Secondary | ICD-10-CM

## 2021-03-17 DIAGNOSIS — Z0000 Encounter for general adult medical examination without abnormal findings: Secondary | ICD-10-CM | POA: Diagnosis not present

## 2021-03-17 DIAGNOSIS — E1165 Type 2 diabetes mellitus with hyperglycemia: Secondary | ICD-10-CM

## 2021-03-17 LAB — HEPATIC FUNCTION PANEL
ALT: 10 U/L (ref 0–53)
AST: 13 U/L (ref 0–37)
Albumin: 4.2 g/dL (ref 3.5–5.2)
Alkaline Phosphatase: 76 U/L (ref 39–117)
Bilirubin, Direct: 0.2 mg/dL (ref 0.0–0.3)
Total Bilirubin: 0.8 mg/dL (ref 0.2–1.2)
Total Protein: 6.8 g/dL (ref 6.0–8.3)

## 2021-03-17 LAB — URINALYSIS, ROUTINE W REFLEX MICROSCOPIC
Bilirubin Urine: NEGATIVE
Hgb urine dipstick: NEGATIVE
Ketones, ur: NEGATIVE
Leukocytes,Ua: NEGATIVE
Nitrite: NEGATIVE
RBC / HPF: NONE SEEN (ref 0–?)
Specific Gravity, Urine: 1.02 (ref 1.000–1.030)
Total Protein, Urine: NEGATIVE
Urine Glucose: 500 — AB
Urobilinogen, UA: 0.2 (ref 0.0–1.0)
pH: 6 (ref 5.0–8.0)

## 2021-03-17 LAB — CBC WITH DIFFERENTIAL/PLATELET
Basophils Absolute: 0 10*3/uL (ref 0.0–0.1)
Basophils Relative: 0.4 % (ref 0.0–3.0)
Eosinophils Absolute: 0.2 10*3/uL (ref 0.0–0.7)
Eosinophils Relative: 2.7 % (ref 0.0–5.0)
HCT: 43.1 % (ref 39.0–52.0)
Hemoglobin: 14.9 g/dL (ref 13.0–17.0)
Lymphocytes Relative: 30.5 % (ref 12.0–46.0)
Lymphs Abs: 1.9 10*3/uL (ref 0.7–4.0)
MCHC: 34.6 g/dL (ref 30.0–36.0)
MCV: 85.5 fl (ref 78.0–100.0)
Monocytes Absolute: 0.3 10*3/uL (ref 0.1–1.0)
Monocytes Relative: 5.5 % (ref 3.0–12.0)
Neutro Abs: 3.8 10*3/uL (ref 1.4–7.7)
Neutrophils Relative %: 60.9 % (ref 43.0–77.0)
Platelets: 159 10*3/uL (ref 150.0–400.0)
RBC: 5.05 Mil/uL (ref 4.22–5.81)
RDW: 13.3 % (ref 11.5–15.5)
WBC: 6.2 10*3/uL (ref 4.0–10.5)

## 2021-03-17 LAB — LIPID PANEL
Cholesterol: 120 mg/dL (ref 0–200)
HDL: 34.7 mg/dL — ABNORMAL LOW (ref >39.00)
LDL Cholesterol: 60 mg/dL (ref 0–99)
NonHDL: 85.76
Total CHOL/HDL Ratio: 3
Triglycerides: 128 mg/dL (ref 0.0–149.0)
VLDL: 25.6 mg/dL (ref 0.0–40.0)

## 2021-03-17 LAB — BASIC METABOLIC PANEL
BUN: 10 mg/dL (ref 6–23)
CO2: 29 mEq/L (ref 19–32)
Calcium: 8.9 mg/dL (ref 8.4–10.5)
Chloride: 103 mEq/L (ref 96–112)
Creatinine, Ser: 0.83 mg/dL (ref 0.40–1.50)
GFR: 94.19 mL/min (ref >60.00)
Glucose, Bld: 220 mg/dL — ABNORMAL HIGH (ref 70–99)
Potassium: 4.3 mEq/L (ref 3.5–5.1)
Sodium: 140 mEq/L (ref 135–145)

## 2021-03-17 LAB — PSA: PSA: 0.62 ng/mL (ref 0.10–4.00)

## 2021-03-17 LAB — TSH: TSH: 2.02 u[IU]/mL (ref 0.35–5.50)

## 2021-03-17 LAB — HEMOGLOBIN A1C: Hgb A1c MFr Bld: 7.8 % — ABNORMAL HIGH (ref 4.6–6.5)

## 2021-03-19 ENCOUNTER — Encounter: Payer: Self-pay | Admitting: Internal Medicine

## 2021-03-19 ENCOUNTER — Other Ambulatory Visit: Payer: Self-pay

## 2021-03-19 ENCOUNTER — Ambulatory Visit (INDEPENDENT_AMBULATORY_CARE_PROVIDER_SITE_OTHER): Payer: No Typology Code available for payment source | Admitting: Internal Medicine

## 2021-03-19 VITALS — BP 118/80 | HR 67 | Temp 98.2°F | Ht 69.0 in | Wt 242.0 lb

## 2021-03-19 DIAGNOSIS — D485 Neoplasm of uncertain behavior of skin: Secondary | ICD-10-CM

## 2021-03-19 DIAGNOSIS — Z1211 Encounter for screening for malignant neoplasm of colon: Secondary | ICD-10-CM

## 2021-03-19 DIAGNOSIS — Z0000 Encounter for general adult medical examination without abnormal findings: Secondary | ICD-10-CM

## 2021-03-19 MED ORDER — SILDENAFIL CITRATE 100 MG PO TABS: 50.0000 mg | ORAL_TABLET | Freq: Every day | ORAL | 5 refills | Status: DC | PRN

## 2021-03-19 NOTE — Assessment & Plan Note (Signed)
  We discussed age appropriate health related issues, including available/recomended screening tests and vaccinations. Labs were ordered to be later reviewed . All questions were answered. We discussed one or more of the following - seat belt use, use of sunscreen/sun exposure exercise,  fall risk reduction, second hand smoke exposure, firearm use and storage, seat belt use, a need for adhering to healthy diet and exercise. Labs were ordered.  All questions were answered. [facility] advised 12/19, 2020, 2022 CT calcium scoring test was offered Colon due 2022 Dr Ardis Hughs

## 2021-03-19 NOTE — Assessment & Plan Note (Signed)
Derm f/u pending

## 2021-03-19 NOTE — Patient Instructions (Signed)

## 2021-03-19 NOTE — Progress Notes (Signed)
Subjective:  Patient ID: Gary Fowler, male    DOB: 12-Feb-1961  Age: 60 y.o. MRN: [id number]  CC: No chief complaint on file.   HPI Gary Fowler presents for a well exam F/u DM, HTN, dyslipidemia f/u  Outpatient Medications Prior to Visit  Medication Sig Dispense Refill   ACCU-CHEK GUIDE test strip USE 1 SRIP TO CHECK GLUCOSE TWICE DAILY 100 each 0   aspirin 81 MG chewable tablet Chew 81 mg by mouth daily.     atorvastatin (LIPITOR) 20 MG tablet TAKE 1 TABLET BY MOUTH ONCE DAILY AT 6 PM 90 tablet 1   BINAXNOW COVID-19 AG HOME TEST KIT Use as Directed on the Package     bisoprolol-hydrochlorothiazide (ZIAC) 10-6.25 MG tablet Take 1 tablet by mouth once daily Annual appt due in AUGUST must see provider for future refills 90 tablet 0   CALCIUM/MAGNESIUM/ZINC FORMULA PO Take by mouth.     doxycycline (VIBRAMYCIN) 100 MG capsule Take 100 mg by mouth 2 (two) times daily.     fluorouracil (EFUDEX) 5 % cream APPLY A THIN LAYER ON THE SKIN TWICE DAILY TO AFFECTED AREA FOR 2 WEEKS  0   glipiZIDE (GLUCOTROL) 5 MG tablet Take 1 tablet (5 mg total) by mouth daily before breakfast. 90 tablet 1   ibuprofen (ADVIL,MOTRIN) 600 MG tablet Take 1 tablet (600 mg total) by mouth 2 (two) times daily as needed. for pain 180 tablet 0   metFORMIN (GLUCOPHAGE) 1000 MG tablet TAKE 1 TABLET BY MOUTH TWICE DAILY WITH MEALS 180 tablet 1   Multiple Vitamin (MULTIVITAMIN) tablet Take 1 tablet by mouth daily.     pioglitazone (ACTOS) 30 MG tablet Take 1 tablet (30 mg total) by mouth daily. 30 tablet 5   Semaglutide (RYBELSUS) 14 MG TABS Take 14 mg by mouth daily. 90 tablet 3   tadalafil (CIALIS) 5 MG tablet Take 1 tablet (5 mg total) by mouth daily. Annual appt due in August must see provider for future refills 30 tablet 0   meloxicam (MOBIC) 15 MG tablet Take 1 tablet (15 mg total) by mouth daily. (Patient not taking: Reported on 03/19/2021) 30 tablet 3   No facility-administered medications prior to visit.     ROS: Review of Systems  Constitutional:  Negative for appetite change, fatigue and unexpected weight change.  HENT:  Negative for congestion, nosebleeds, sneezing, sore throat and trouble swallowing.   Eyes:  Negative for itching and visual disturbance.  Respiratory:  Negative for cough.   Cardiovascular:  Negative for chest pain, palpitations and leg swelling.  Gastrointestinal:  Negative for abdominal distention, blood in stool, diarrhea and nausea.  Genitourinary:  Negative for frequency and hematuria.  Musculoskeletal:  Negative for back pain, gait problem, joint swelling and neck pain.  Skin:  Negative for rash.  Neurological:  Negative for dizziness, tremors, speech difficulty and weakness.  Psychiatric/Behavioral:  Negative for agitation, dysphoric mood and sleep disturbance. The patient is not nervous/anxious.    Objective:  BP 118/80 (BP Location: Left Arm, Patient Position: Sitting, Cuff Size: Normal)   Pulse 67   Temp 98.2 F (36.8 C) (Oral)   Ht _0  (1.753 m)   Wt 242 lb (109.8 kg)   SpO2 97%   BMI 35.74 kg/m   BP Readings from Last 3 Encounters:  03/19/21 118/80  12/19/20 130/84  06/19/20 124/84    Wt Readings from Last 3 Encounters:  03/19/21 242 lb (109.8 kg)  12/19/20 248 lb 6 oz (112.7  kg)  06/19/20 252 lb (114.3 kg)    Physical Exam Constitutional:      General: He is not in acute distress.    Appearance: He is well-developed. He is obese.     Comments: NAD  Eyes:     Conjunctiva/sclera: Conjunctivae normal.     Pupils: Pupils are equal, round, and reactive to light.  Neck:     Thyroid: No thyromegaly.     Vascular: No JVD.  Cardiovascular:     Rate and Rhythm: Normal rate and regular rhythm.     Heart sounds: Normal heart sounds. No murmur heard.   No friction rub. No gallop.  Pulmonary:     Effort: Pulmonary effort is normal. No respiratory distress.     Breath sounds: Normal breath sounds. No wheezing or rales.  Chest:     Chest  wall: No tenderness.  Abdominal:     General: Bowel sounds are normal. There is no distension.     Palpations: Abdomen is soft. There is no mass.     Tenderness: There is no abdominal tenderness. There is no guarding or rebound.  Musculoskeletal:        General: No tenderness. Normal range of motion.     Cervical back: Normal range of motion.  Lymphadenopathy:     Cervical: No cervical adenopathy.  Skin:    General: Skin is warm and dry.     Findings: No rash.  Neurological:     Mental Status: He is alert and oriented to person, place, and time.     Cranial Nerves: No cranial nerve deficit.     Motor: No abnormal muscle tone.     Coordination: Coordination normal.     Gait: Gait normal.     Deep Tendon Reflexes: Reflexes are normal and symmetric.  Psychiatric:        Behavior: Behavior normal.        Thought Content: Thought content normal.        Judgment: Judgment normal.  Rectal - per GI AKs on face  Lab Results  Component Value Date   WBC 6.2 03/17/2021   HGB 14.9 03/17/2021   HCT 43.1 03/17/2021   PLT 159.0 03/17/2021   GLUCOSE 220 (H) 03/17/2021   CHOL 120 03/17/2021   TRIG 128.0 03/17/2021   HDL 34.70 (L) 03/17/2021   LDLDIRECT 56.3 11/20/2012   LDLCALC 60 03/17/2021   ALT 10 03/17/2021   AST 13 03/17/2021   NA 140 03/17/2021   K 4.3 03/17/2021   CL 103 03/17/2021   CREATININE 0.83 03/17/2021   BUN 10 03/17/2021   CO2 29 03/17/2021   TSH 2.02 03/17/2021   PSA 0.62 03/17/2021   HGBA1C 7.8 (H) 03/17/2021   MICROALBUR 1.1 12/19/2020    No results found.  Assessment & Plan:     Walker Kehr, MD

## 2021-04-11 ENCOUNTER — Other Ambulatory Visit: Payer: Self-pay | Admitting: Internal Medicine

## 2021-04-11 DIAGNOSIS — E1165 Type 2 diabetes mellitus with hyperglycemia: Secondary | ICD-10-CM

## 2021-04-17 ENCOUNTER — Encounter: Payer: Self-pay | Admitting: Internal Medicine

## 2021-04-17 ENCOUNTER — Ambulatory Visit (INDEPENDENT_AMBULATORY_CARE_PROVIDER_SITE_OTHER): Payer: No Typology Code available for payment source | Admitting: Internal Medicine

## 2021-04-17 ENCOUNTER — Other Ambulatory Visit: Payer: Self-pay

## 2021-04-17 VITALS — BP 120/82 | HR 70 | Ht 69.0 in | Wt 241.4 lb

## 2021-04-17 DIAGNOSIS — E1165 Type 2 diabetes mellitus with hyperglycemia: Secondary | ICD-10-CM | POA: Diagnosis not present

## 2021-04-17 MED ORDER — METFORMIN HCL 1000 MG PO TABS: 1000.0000 mg | ORAL_TABLET | Freq: Two times a day (BID) | ORAL | 3 refills | Status: DC

## 2021-04-17 MED ORDER — GLIPIZIDE 5 MG PO TABS: 5.0000 mg | ORAL_TABLET | Freq: Two times a day (BID) | ORAL | 3 refills | Status: DC

## 2021-04-17 MED ORDER — RYBELSUS 14 MG PO TABS: 14.0000 mg | ORAL_TABLET | Freq: Every day | ORAL | 3 refills | Status: DC

## 2021-04-17 MED ORDER — PIOGLITAZONE HCL 30 MG PO TABS: 30.0000 mg | ORAL_TABLET | Freq: Every day | ORAL | 3 refills | Status: DC

## 2021-04-17 NOTE — Patient Instructions (Addendum)
-   Continue  Pioglitazone 30 mg, 1 tablet before breakfast  - Continue Rybelsus 14  mg , 1 tablet before  Breakfast - Increase Glipizide 5 mg, 1 tablets before breakfast and 1 tablet before Supper - Continue Metformin 1000 mg , 1 tablet twice daily     HOW TO TREAT LOW BLOOD SUGARS (Blood sugar LESS THAN 70 MG/DL) Please follow the RULE OF 15 for the treatment of hypoglycemia treatment (when your (blood sugars are less than 70 mg/dL)   STEP 1: Take 15 grams of carbohydrates when your blood sugar is low, which includes:  3-4 GLUCOSE TABS  OR 3-4 OZ OF JUICE OR REGULAR SODA OR ONE TUBE OF GLUCOSE GEL    STEP 2: RECHECK blood sugar in 15 MINUTES STEP 3: If your blood sugar is still low at the 15 minute recheck --> then, go back to STEP 1 and treat AGAIN with another 15 grams of carbohydrates.

## 2021-04-17 NOTE — Progress Notes (Signed)
Name: Gary Fowler  Age/ Sex: 60 y.o., male   MRN/ DOB: [id number], November 01, 1960     PCP: Cassandria Anger, MD   Reason for Endocrinology Evaluation: Type 2 Diabetes Mellitus  Initial Endocrine Consultative Visit: 12/07/2019    PATIENT IDENTIFIER: Mr. Gary Fowler is a 60 y.o. male with a past medical history of HTN, T2DM, and dyslipidemia. The patient has followed with Endocrinology clinic since 12/07/2019 for consultative assistance with management of his diabetes.  DIABETIC HISTORY:  Mr. Sinning was diagnosed with T2DM in 2008, . His hemoglobin A1c has ranged from 6.3% in 2016, peaking at 8.5% in 2021.  On his initial visit to our clinic his A1c was 8.5%. He was on Metformin, Glimepiride and Oseni.We switched Glimepiride to Glipizide and continued metformin and Oseni.   By 02/2020 we switched Oseni to Pioglitazone and Rybelsus   Testosterone checked due to complaints of ED 06/2020 with normal testosterone at 367 ng/dL    SUBJECTIVE:   During the last visit (12/19/2020): A1c 7.6 %. We continued pioglitazone, Glipizide,metformin and increased Rybelsus      Today (04/17/2021): Mr. Kinzler is here for a follow up on diabetes management.  He checks his blood sugars 1 -2 times daily. The patient has not had hypoglycemic episodes since the last clinic visit.    Denies nausea or vomiting but has noted loose stools once a day after a certain meal    HOME DIABETES REGIMEN:  Metformin 1000 mg BID  Glipizide 5 mg daily  Pioglitazone 30 mg, 1 tablet daily  Rybelsus 14 mg      Statin: yes ACE-I/ARB: no    METER DOWNLOAD SUMMARY: Date range evaluated: 8/19-04/17/2021 Average Number Tests/Day = 0.8 Overall Mean FS Glucose = 186   BG Ranges: Low = 140 High = 257  Hypoglycemic Events/30 Days: BG < 50 = 0 Episodes of symptomatic severe hypoglycemia = 0   DIABETIC COMPLICATIONS: Microvascular complications:    Denies: CKD, retinopapthy , neuropathy  Last eye  exam: Completed  20222   Macrovascular complications:    Denies: CAD, PVD, CVA    HISTORY:  Past Medical History:  Past Medical History:  Diagnosis Date   ED (erectile dysfunction)    Family history of prostate cancer    Hyperlipidemia    Hypertension    Insomnia    LBP (low back pain)    Mood swings    OSA (obstructive sleep apnea)    Skin cancer of nose 2008   Left   Type II or unspecified type diabetes mellitus without mention of complication, not stated as uncontrolled    Past Surgical History:  Past Surgical History:  Procedure Laterality Date   arm fracture     with plate   ARTHROSCOPY KNEE W/ DRILLING     rt. knee   bone spur removal     lt. shoulder   LUMBAR FUSION     LUMBAR LAMINECTOMY     SKIN CANCER EXCISION     face   Social History:  reports that he has quit smoking. His smokeless tobacco use includes chew. He reports that he does not drink alcohol and does not use drugs. Family History:  Family History  Problem Relation Age of Onset   Cancer Father        Melanoma   Hypertension Mother    Crohn's disease Paternal Uncle    Diabetes Maternal Grandfather    Diabetes Paternal Grandfather      HOME MEDICATIONS: Allergies  as of 04/17/2021       Reactions   Morphine Sulfate    REACTION: headache        Medication List        Accurate as of April 17, 2021  8:06 AM. If you have any questions, ask your nurse or doctor.          STOP taking these medications    doxycycline 100 MG capsule Commonly known as: VIBRAMYCIN Stopped by: Dorita Sciara, MD       TAKE these medications    Accu-Chek Guide test strip Generic drug: glucose blood USE 1 SRIP TO CHECK GLUCOSE TWICE DAILY   aspirin 81 MG chewable tablet Chew 81 mg by mouth daily.   atorvastatin 20 MG tablet Commonly known as: LIPITOR TAKE 1 TABLET BY MOUTH ONCE DAILY AT 6 PM   BinaxNOW COVID-19 Ag Home Test Kit Generic drug: COVID-19 At Home Antigen Test Use as  Directed on the Package   bisoprolol-hydrochlorothiazide 10-6.25 MG tablet Commonly known as: ZIAC Take 1 tablet by mouth once daily Annual appt due in AUGUST must see provider for future refills   CALCIUM/MAGNESIUM/ZINC FORMULA PO Take by mouth.   fluorouracil 5 % cream Commonly known as: EFUDEX APPLY A THIN LAYER ON THE SKIN TWICE DAILY TO AFFECTED AREA FOR 2 WEEKS   glipiZIDE 5 MG tablet Commonly known as: GLUCOTROL Take 1 tablet (5 mg total) by mouth 2 (two) times daily before a meal. What changed: when to take this Changed by: Dorita Sciara, MD   ibuprofen 600 MG tablet Commonly known as: ADVIL Take 1 tablet (600 mg total) by mouth 2 (two) times daily as needed. for pain   metFORMIN 1000 MG tablet Commonly known as: GLUCOPHAGE Take 1 tablet (1,000 mg total) by mouth 2 (two) times daily with a meal.   multivitamin tablet Take 1 tablet by mouth daily.   pioglitazone 30 MG tablet Commonly known as: ACTOS Take 1 tablet (30 mg total) by mouth daily.   Rybelsus 14 MG Tabs Generic drug: Semaglutide Take 14 mg by mouth daily.   sildenafil 100 MG tablet Commonly known as: Viagra Take 0.5-1 tablets (50-100 mg total) by mouth daily as needed for erectile dysfunction.         OBJECTIVE:   Vital Signs: BP 120/82 (BP Location: Left Arm, Patient Position: Sitting, Cuff Size: Small)   Pulse 70   Ht '5\' 9"'  (1.753 m)   Wt 241 lb 6.4 oz (109.5 kg)   SpO2 96%   BMI 35.65 kg/m   Wt Readings from Last 3 Encounters:  04/17/21 241 lb 6.4 oz (109.5 kg)  03/19/21 242 lb (109.8 kg)  12/19/20 248 lb 6 oz (112.7 kg)     Exam: General: Pt appears well and is in NAD  Neck: General: Supple without adenopathy. Thyroid: Thyroid size normal.  No goiter or nodules appreciated.   Lungs: Clear with good BS bilat with no rales, rhonchi, or wheezes  Heart: RRR with normal S1 and S2 and no gallops; no murmurs; no rub  Abdomen: Normoactive bowel sounds, soft, nontender, without  masses or organomegaly palpable  Extremities: No pretibial edema.  Neuro: MS is good with appropriate affect, pt is alert and Ox3    DM foot exam:  12/19/2020   The skin of the feet is intact without sores or ulcerations. The pedal pulses are +1 on right and 1+ on left. The sensation is intact to a screening 5.07, 10 gram monofilament  bilaterally     DATA REVIEWED:  Lab Results  Component Value Date   HGBA1C 7.8 (H) 03/17/2021   HGBA1C 7.6 (A) 12/19/2020   HGBA1C 7.0 (A) 06/19/2020   Results for WYETH, HOFFER (MRN [id number]) as of 12/19/2020 13:40  Ref. Range 12/19/2020 08:01  Creatinine,U Latest Units: mg/dL 184.8  Microalb, Ur Latest Ref Range: 0.0 - 1.9 mg/dL 1.1  MICROALB/CREAT RATIO Latest Ref Range: 0.0 - 30.0 mg/g 0.6     ASSESSMENT / PLAN / RECOMMENDATIONS:   1) Type 2 Diabetes Mellitus,  Sub-Optimally Controlled , Without  complications - Most recent A1c of 7.8%. Goal A1c < 7.0 %.    - His A1c continues to trend up despite escalating medication doses and despite lifestyle changes  - I have praised him on the weight loss and encouraged him to continue with lifestyle changes even though his A1c has not reflected that.  - I am going to increase his Glipizide as he has been noted with fasting hyperglycemia     MEDICATIONS: - Continue  Pioglitazone 30 mg, 1 tablet daily  - Continue Rybelsus 14 mg , 1 tablet with Breakfast  - Increase Glipizide 5 mg, BID  - Continue Metformin 1000 mg , 1 tablet twice daily    EDUCATION / INSTRUCTIONS: BG monitoring instructions: Patient is instructed to check his blood sugars 1 times a day, before breakfast Call [facility] if: BG persistently < 70 I reviewed the Rule of 15 for the treatment of hypoglycemia in detail with the patient. Literature supplied.    2) Diabetic complications:  Eye: Does not have known diabetic retinopathy.  Neuro/ Feet: Does not have known diabetic peripheral neuropathy .  Renal:  Patient does not have known baseline CKD. He   is not on an ACEI/ARB at present.       F/U in 4 months     Signed electronically by: Mack Guise, MD  [facility]  [facility] [address] [place], [place] [address] Phone: [phone] FAX: [phone]   CC: Cassandria Anger, MD [place] [address] Phone: [phone]  Fax: [phone]  Return to Endocrinology clinic as below: Future Appointments  Date Time Provider [place]  08/21/2021  7:30 AM Casie Sturgeon, Melanie Crazier, MD [facility] None  09/21/2021  7:50 AM Plotnikov, Evie Lacks, MD [facility] None

## 2021-04-20 ENCOUNTER — Encounter: Payer: Self-pay | Admitting: Gastroenterology

## 2021-04-21 ENCOUNTER — Other Ambulatory Visit: Payer: Self-pay | Admitting: Internal Medicine

## 2021-05-13 ENCOUNTER — Other Ambulatory Visit: Payer: Self-pay | Admitting: Internal Medicine

## 2021-06-02 ENCOUNTER — Other Ambulatory Visit: Payer: Self-pay | Admitting: Internal Medicine

## 2021-06-02 DIAGNOSIS — E1165 Type 2 diabetes mellitus with hyperglycemia: Secondary | ICD-10-CM

## 2021-06-03 MED ORDER — ACCU-CHEK GUIDE VI STRP: ORAL_STRIP | 0 refills | Status: DC

## 2021-08-21 ENCOUNTER — Ambulatory Visit (INDEPENDENT_AMBULATORY_CARE_PROVIDER_SITE_OTHER): Payer: No Typology Code available for payment source | Admitting: Internal Medicine

## 2021-08-21 ENCOUNTER — Encounter: Payer: Self-pay | Admitting: Internal Medicine

## 2021-08-21 ENCOUNTER — Other Ambulatory Visit: Payer: Self-pay

## 2021-08-21 VITALS — BP 128/80 | HR 74 | Ht 69.0 in | Wt 234.8 lb

## 2021-08-21 DIAGNOSIS — E1165 Type 2 diabetes mellitus with hyperglycemia: Secondary | ICD-10-CM | POA: Diagnosis not present

## 2021-08-21 LAB — POCT GLYCOSYLATED HEMOGLOBIN (HGB A1C): Hemoglobin A1C: 7.6 % — AB (ref 4.0–5.6)

## 2021-08-21 LAB — GLUCOSE, POCT (MANUAL RESULT ENTRY): POC Glucose: 157 mg/dl — AB (ref 70–99)

## 2021-08-21 MED ORDER — PIOGLITAZONE HCL 45 MG PO TABS: 45.0000 mg | ORAL_TABLET | Freq: Every day | ORAL | 3 refills | Status: DC

## 2021-08-21 MED ORDER — METFORMIN HCL ER 500 MG PO TB24: 1000.0000 mg | ORAL_TABLET | Freq: Two times a day (BID) | ORAL | 1 refills | Status: DC

## 2021-08-21 MED ORDER — GLIPIZIDE 5 MG PO TABS: 7.5000 mg | ORAL_TABLET | Freq: Two times a day (BID) | ORAL | 3 refills | Status: DC

## 2021-08-21 NOTE — Progress Notes (Signed)
Name: Gary Fowler  Age/ Sex: 61 y.o., male   MRN/ DOB: [id number], August 23, 1960     PCP: Cassandria Anger, MD   Reason for Endocrinology Evaluation: Type 2 Diabetes Mellitus  Initial Endocrine Consultative Visit: 12/07/2019    PATIENT IDENTIFIER: Mr. MARKISE HAYMER is a 61 y.o. male with a past medical history of HTN, T2DM, and dyslipidemia. The patient has followed with Endocrinology clinic since 12/07/2019 for consultative assistance with management of his diabetes.  DIABETIC HISTORY:  Mr. Doshi was diagnosed with T2DM in 2008, . His hemoglobin A1c has ranged from 6.3% in 2016, peaking at 8.5% in 2021.  On his initial visit to our clinic his A1c was 8.5%. He was on Metformin, Glimepiride and Oseni.We switched Glimepiride to Glipizide and continued metformin and Oseni.   By 02/2020 we switched Oseni to Pioglitazone and Rybelsus   Testosterone checked due to complaints of ED 06/2020 with normal testosterone at 367 ng/dL    SUBJECTIVE:   During the last visit (04/17/2021): A1c 7.8 %. We continued pioglitazone, metformin and  Rybelsus and increased Glipizide      Today (08/21/2021): Mr. Hulgan is here for a follow up on diabetes management.  He checks his blood sugars 2 times daily. The patient has not had hypoglycemic episodes since the last clinic visit.   He has been avoiding sugar-sweetened beverages   Has has occasional watery loose stools , pending colonoscopy attributes this to possibly Metformin   HOME DIABETES REGIMEN:  Metformin 1000 mg BID  Glipizide 5 mg BID  Pioglitazone 30 mg, 1 tablet daily  Rybelsus 14 mg      Statin: yes ACE-I/ARB: no    METER DOWNLOAD SUMMARY: did not bring    DIABETIC COMPLICATIONS: Microvascular complications:    Denies: CKD, retinopapthy , neuropathy  Last eye exam: Completed  20222   Macrovascular complications:    Denies: CAD, PVD, CVA    HISTORY:  Past Medical History:  Past Medical History:   Diagnosis Date   ED (erectile dysfunction)    Family history of prostate cancer    Hyperlipidemia    Hypertension    Insomnia    LBP (low back pain)    Mood swings    OSA (obstructive sleep apnea)    Skin cancer of nose 2008   Left   Type II or unspecified type diabetes mellitus without mention of complication, not stated as uncontrolled    Past Surgical History:  Past Surgical History:  Procedure Laterality Date   arm fracture     with plate   ARTHROSCOPY KNEE W/ DRILLING     rt. knee   bone spur removal     lt. shoulder   LUMBAR FUSION     LUMBAR LAMINECTOMY     SKIN CANCER EXCISION     face   Social History:  reports that he has quit smoking. His smokeless tobacco use includes chew. He reports that he does not drink alcohol and does not use drugs. Family History:  Family History  Problem Relation Age of Onset   Cancer Father        Melanoma   Hypertension Mother    Crohn's disease Paternal Uncle    Diabetes Maternal Grandfather    Diabetes Paternal Grandfather      HOME MEDICATIONS: Allergies as of 08/21/2021       Reactions   Morphine Sulfate    REACTION: headache        Medication List  Accurate as of August 21, 2021  7:38 AM. If you have any questions, ask your nurse or doctor.  °  °  ° °  ° °Accu-Chek Guide test strip °Generic drug: glucose blood °Use as instructed °  °aspirin 81 MG chewable tablet °Chew 81 mg by mouth daily. °  °atorvastatin 20 MG tablet °Commonly known as: LIPITOR °TAKE 1 TABLET BY MOUTH ONCE DAILY AT  6  PM °  °BinaxNOW COVID-19 Ag Home Test Kit °Generic drug: COVID-19 At Home Antigen Test °Use as Directed on the Package °  °bisoprolol-hydrochlorothiazide 10-6.25 MG tablet °Commonly known as: ZIAC °TAKE 1 TABLET BY MOUTH ONCE DAILY ANNUAL  APPOINTMENT  DUE  IN  AUGUST  MUST  SEE  PROVIDER  FOR  FUTURE  REFILLS °  °CALCIUM/MAGNESIUM/ZINC FORMULA PO °Take by mouth. °  °fluorouracil 5 % cream °Commonly known as: EFUDEX °APPLY A THIN  LAYER ON THE SKIN TWICE DAILY TO AFFECTED AREA FOR 2 WEEKS °  °glipiZIDE 5 MG tablet °Commonly known as: GLUCOTROL °Take 1 tablet (5 mg total) by mouth 2 (two) times daily before a meal. °  °ibuprofen 600 MG tablet °Commonly known as: ADVIL °Take 1 tablet (600 mg total) by mouth 2 (two) times daily as needed. for pain °  °metFORMIN 1000 MG tablet °Commonly known as: GLUCOPHAGE °Take 1 tablet (1,000 mg total) by mouth 2 (two) times daily with a meal. °  °multivitamin tablet °Take 1 tablet by mouth daily. °  °pioglitazone 30 MG tablet °Commonly known as: ACTOS °Take 1 tablet (30 mg total) by mouth daily. °  °Rybelsus 14 MG Tabs °Generic drug: Semaglutide °Take 14 mg by mouth daily. °  °sildenafil 100 MG tablet °Commonly known as: Viagra °Take 0.5-1 tablets (50-100 mg total) by mouth daily as needed for erectile dysfunction. °  ° °  ° ° ° °OBJECTIVE:  ° °Vital Signs: BP 128/80 (BP Location: Left Arm, Patient Position: Sitting, Cuff Size: Small)    Pulse 74    Ht 5' 9" (1.753 m)    Wt 234 lb 12.8 oz (106.5 kg)    SpO2 98%    BMI 34.67 kg/m²   °Wt Readings from Last 3 Encounters:  °08/21/21 234 lb 12.8 oz (106.5 kg)  °04/17/21 241 lb 6.4 oz (109.5 kg)  °03/19/21 242 lb (109.8 kg)  ° ° ° °Exam: °General: Pt appears well and is in NAD  °Neck: General: Supple without adenopathy. °Thyroid: Thyroid size normal.  No goiter or nodules appreciated.   °Lungs: Clear with good BS bilat with no rales, rhonchi, or wheezes  °Heart: RRR with normal S1 and S2 and no gallops; no murmurs; no rub  °Abdomen: Normoactive bowel sounds, soft, nontender, without masses or organomegaly palpable  °Extremities: No pretibial edema.  °Neuro: MS is good with appropriate affect, pt is alert and Ox3  ° ° °DM foot exam:  12/19/2020 °  °The skin of the feet is intact without sores or ulcerations. °The pedal pulses are +1 on right and 1+ on left. °The sensation is intact to a screening 5.07, 10 gram monofilament bilaterally °  ° ° °DATA REVIEWED: ° °Lab  Results  °Component Value Date  ° HGBA1C 7.6 (A) 08/21/2021  ° HGBA1C 7.8 (H) 03/17/2021  ° HGBA1C 7.6 (A) 12/19/2020  ° °Results for Riecke, Aubry D (MRN [id number]) as of 12/19/2020 13:40 ° Ref. Range 12/19/2020 08:01  °Creatinine,U Latest Units: mg/dL 184.8  °Microalb, Ur Latest Ref Range: 0.0 - 1.9 mg/dL 1.1  °  MICROALB/CREAT RATIO Latest Ref Range: 0.0 - 30.0 mg/g 0.6  ° ° Latest Reference Range & Units 03/17/21 07:33  °Sodium 135 - 145 mEq/L 140  °Potassium 3.5 - 5.1 mEq/L 4.3  °Chloride 96 - 112 mEq/L 103  °CO2 19 - 32 mEq/L 29  °Glucose 70 - 99 mg/dL 220 (H)  °BUN 6 - 23 mg/dL 10  °Creatinine 0.40 - 1.50 mg/dL 0.83  °Calcium 8.4 - 10.5 mg/dL 8.9  °Alkaline Phosphatase 39 - 117 U/L 76  °Albumin 3.5 - 5.2 g/dL 4.2  °AST 0 - 37 U/L 13  °ALT 0 - 53 U/L 10  °Total Protein 6.0 - 8.3 g/dL 6.8  °Bilirubin, Direct 0.0 - 0.3 mg/dL 0.2  °Total Bilirubin 0.2 - 1.2 mg/dL 0.8  °GFR >60.00 mL/min 94.19  °Total CHOL/HDL Ratio  3  °Cholesterol 0 - 200 mg/dL 120  °HDL Cholesterol >39.00 mg/dL 34.70 (L)  °LDL (calc) 0 - 99 mg/dL 60  °NonHDL  85.76  °Triglycerides 0.0 - 149.0 mg/dL 128.0  °VLDL 0.0 - 40.0 mg/dL 25.6  ° ° °ASSESSMENT / PLAN / RECOMMENDATIONS:  ° °1) Type 2 Diabetes Mellitus,  Sub-Optimally Controlled , Without  complications - Most recent A1c of 7.6 %. Goal A1c < 7.0 %.   ° °- Minimal improvement in A1c  °- I have praised him on the weight loss and encouraged him to continue with lifestyle changes  °- He is concerned about metformin, he has heard that it has grave side effects, we had a long discussion about this . We discussed the risk and the benefits of Metformin.  °- We briefly discussed SGLT-2 inhibitors if needed in the future  °- He has diarrhea which I explained to him could be due to either metformin or Rybelsus . Will switch metformin to XR and see how he does on that  °- Will increase Glipizide and Piolitzone as below  ° ° ° ° °MEDICATIONS: °- Increase  Pioglitazone 45 mg, 1 tablet daily  °- Continue  Rybelsus 14 mg , 1 tablet with Breakfast  °- Increase Glipizide 5 mg, 1.5 tablets before breakfast and supper  °- Change  Metformin 500 mg  XR , 2 tablet twice daily  ° ° °EDUCATION / INSTRUCTIONS: °BG monitoring instructions: Patient is instructed to check his blood sugars 1 times a day, before breakfast °Call [facility] if: BG persistently < 70 °I reviewed the Rule of 15 for the treatment of hypoglycemia in detail with the patient. Literature supplied. ° ° ° °2) Diabetic complications:  °Eye: Does not have known diabetic retinopathy.  °Neuro/ Feet: Does not have known diabetic peripheral neuropathy .  °Renal: Patient does not have known baseline CKD. He   is not on an ACEI/ARB at present.  ° ° ° ° ° °F/U in 4 months   ° ° °I spent 25 minutes preparing to see the patient by review of recent labs, imaging and procedures, obtaining and reviewing separately obtained history, communicating with the patient, ordering medications, and documenting clinical information in the EHR including the differential Dx, treatment, and any further evaluation and other management  ° ° °Signed electronically by: °Abby Jaralla Shamleffer, MD ° °[facility]  °[facility] °[address] °[place],  [address] °Phone: [phone] °FAX: [phone] ° ° °CC: °Plotnikov, Aleksei V, MD °[address] °[place]  [address] °Phone: [phone]  °Fax: [phone] ° °Return to Endocrinology clinic as below: °Future Appointments  °Date Time Provider Department Center  °  09/21/2021  7:50 AM Plotnikov, Evie Lacks, MD [facility] None

## 2021-08-21 NOTE — Patient Instructions (Addendum)
-   Increase  Pioglitazone to 45  mg, 1 tablet before breakfast  - Increase Glipizide 5 mg, to 1.5 tablets before Breakfast and Supper - Change Metformin to 500 mg XR, 2 tablets with Breakfast and Supper  - Continue Rybelsus 14  mg , 1 tablet before  Breakfast    HOW TO TREAT LOW BLOOD SUGARS (Blood sugar LESS THAN 70 MG/DL) Please follow the RULE OF 15 for the treatment of hypoglycemia treatment (when your (blood sugars are less than 70 mg/dL)   STEP 1: Take 15 grams of carbohydrates when your blood sugar is low, which includes:  3-4 GLUCOSE TABS  OR 3-4 OZ OF JUICE OR REGULAR SODA OR ONE TUBE OF GLUCOSE GEL    STEP 2: RECHECK blood sugar in 15 MINUTES STEP 3: If your blood sugar is still low at the 15 minute recheck --> then, go back to STEP 1 and treat AGAIN with another 15 grams of carbohydrates.

## 2021-09-14 ENCOUNTER — Other Ambulatory Visit: Payer: Self-pay | Admitting: Internal Medicine

## 2021-09-14 DIAGNOSIS — E1165 Type 2 diabetes mellitus with hyperglycemia: Secondary | ICD-10-CM

## 2021-09-21 ENCOUNTER — Ambulatory Visit: Payer: No Typology Code available for payment source | Admitting: Internal Medicine

## 2021-12-25 ENCOUNTER — Ambulatory Visit (INDEPENDENT_AMBULATORY_CARE_PROVIDER_SITE_OTHER): Payer: No Typology Code available for payment source | Admitting: Internal Medicine

## 2021-12-25 ENCOUNTER — Encounter: Payer: Self-pay | Admitting: Internal Medicine

## 2021-12-25 VITALS — BP 126/78 | HR 83 | Ht 69.0 in | Wt 248.2 lb

## 2021-12-25 DIAGNOSIS — E1165 Type 2 diabetes mellitus with hyperglycemia: Secondary | ICD-10-CM | POA: Diagnosis not present

## 2021-12-25 DIAGNOSIS — R739 Hyperglycemia, unspecified: Secondary | ICD-10-CM

## 2021-12-25 LAB — POCT GLYCOSYLATED HEMOGLOBIN (HGB A1C): Hemoglobin A1C: 7.1 % — AB (ref 4.0–5.6)

## 2021-12-25 MED ORDER — GLIPIZIDE 5 MG PO TABS: ORAL_TABLET | ORAL | 3 refills | Status: DC

## 2021-12-25 NOTE — Progress Notes (Signed)
?Name: Gary Fowler  ?Age/ Sex: 61 y.o., male   ?MRN/ DOB: [id number], 1960/11/12    ? ?PCP: Plotnikov, Evie Lacks, MD   ?Reason for Endocrinology Evaluation: Type 2 Diabetes Mellitus  ?Initial Endocrine Consultative Visit: 12/07/2019  ? ? ?PATIENT IDENTIFIER: Mr. Gary Fowler is a 61 y.o. male with a past medical history of HTN, T2DM, and dyslipidemia. The patient has followed with Endocrinology clinic since 12/07/2019 for consultative assistance with management of his diabetes. ? ?DIABETIC HISTORY:  ?Mr. Mazon was diagnosed with T2DM in 2008, . His hemoglobin A1c has ranged from 6.3% in 2016, peaking at 8.5% in 2021. ? ?On his initial visit to our clinic his A1c was 8.5%. He was on Metformin, Glimepiride and Oseni.We switched Glimepiride to Glipizide and continued metformin and Oseni.  ? ?By 02/2020 we switched Oseni to Pioglitazone and Rybelsus ? ? ?Testosterone checked due to complaints of ED 06/2020 with normal testosterone at 367 ng/dL  ? ? ?SUBJECTIVE:  ? ?During the last visit (08/21/2020): A1c 7.6 %. We increased pioglitazone, continued metformin,  Rybelsus and Glipizide  ? ? ? ? ?Today (12/25/2021): [name] is here for a follow up on diabetes management.  He checks his blood sugars 2 times daily. The patient has not had hypoglycemic episodes since the last clinic visit. ? ? ?S/P  colonoscopy , repeat 8 years  ? ?Loose stools have resolved now with constipation  ? ? ? ? ? ? ? ? ?HOME DIABETES REGIMEN:  ?Metformin 500 mg XR, 2 tabs  BID  ?Glipizide 5 mg , 1.5 tabs BID  ?Pioglitazone 45 mg, 1 tablet daily  ?Rybelsus 14 mg  ? ? ? ? ?Statin: yes ?ACE-I/ARB: no ? ? ? ?METER DOWNLOAD SUMMARY: did not bring  ?90's -142 mg/dL  ? ?DIABETIC COMPLICATIONS: ?Microvascular complications:  ?  ?Denies: CKD, retinopapthy , neuropathy  ?Last eye exam: Completed  2022 ?  ?Macrovascular complications:  ?  ?Denies: CAD, PVD, CVA ?  ? ?HISTORY:  ?Past Medical History:  ?Past Medical History:  ?Diagnosis Date  ? ED  (erectile dysfunction)   ? Family history of prostate cancer   ? Hyperlipidemia   ? Hypertension   ? Insomnia   ? LBP (low back pain)   ? Mood swings   ? OSA (obstructive sleep apnea)   ? Skin cancer of nose 2008  ? Left  ? Type II or unspecified type diabetes mellitus without mention of complication, not stated as uncontrolled   ? ?Past Surgical History:  ?Past Surgical History:  ?Procedure Laterality Date  ? arm fracture    ? with plate  ? ARTHROSCOPY KNEE W/ DRILLING    ? rt. knee  ? bone spur removal    ? lt. shoulder  ? LUMBAR FUSION    ? LUMBAR LAMINECTOMY    ? SKIN CANCER EXCISION    ? face  ? ?Social History:  reports that he has quit smoking. His smokeless tobacco use includes chew. He reports that he does not drink alcohol and does not use drugs. ?Family History:  ?Family History  ?Problem Relation Age of Onset  ? Cancer Father   ?     Melanoma  ? Hypertension Mother   ? Crohn's disease Paternal Uncle   ? Diabetes Maternal Grandfather   ? Diabetes Paternal Grandfather   ? ? ? ?HOME MEDICATIONS: ?Allergies as of 12/25/2021   ? ?   Reactions  ? Morphine Sulfate   ? REACTION: headache  ? ?  ? ?  ?  Medication List  ?  ? ?  ? Accurate as of Dec 25, 2021  7:44 AM. If you have any questions, ask your nurse or doctor.  ?  ?  ? ?  ? ?Accu-Chek Guide test strip ?Generic drug: glucose blood ?USE AS DIRECTED ?  ?aspirin 81 MG chewable tablet ?Chew 81 mg by mouth daily. ?  ?atorvastatin 20 MG tablet ?Commonly known as: LIPITOR ?TAKE 1 TABLET BY MOUTH ONCE DAILY AT  6PM ?  ?BinaxNOW COVID-19 Ag Home Test Kit ?Generic drug: COVID-19 At Home Antigen Test ?Use as Directed on the Package ?  ?bisoprolol-hydrochlorothiazide 10-6.25 MG tablet ?Commonly known as: ZIAC ?TAKE 1 TABLET BY MOUTH ONCE DAILY ANNUAL  APPOINTMENT  DUE  IN  AUGUST  MUST  SEE  PROVIDER  FOR  FUTURE  REFILLS ?  ?CALCIUM/MAGNESIUM/ZINC FORMULA PO ?Take by mouth. ?  ?fluorouracil 5 % cream ?Commonly known as: EFUDEX ?APPLY A THIN LAYER ON THE SKIN TWICE DAILY  TO AFFECTED AREA FOR 2 WEEKS ?  ?glipiZIDE 5 MG tablet ?Commonly known as: GLUCOTROL ?Take 1.5 tablets (7.5 mg total) by mouth 2 (two) times daily before a meal. ?  ?ibuprofen 600 MG tablet ?Commonly known as: ADVIL ?Take 1 tablet (600 mg total) by mouth 2 (two) times daily as needed. for pain ?  ?metFORMIN 500 MG 24 hr tablet ?Commonly known as: GLUCOPHAGE-XR ?Take 2 tablets (1,000 mg total) by mouth 2 (two) times daily. ?  ?multivitamin tablet ?Take 1 tablet by mouth daily. ?  ?pioglitazone 45 MG tablet ?Commonly known as: Actos ?Take 1 tablet (45 mg total) by mouth daily. ?  ?Rybelsus 14 MG Tabs ?Generic drug: Semaglutide ?Take 14 mg by mouth daily. ?  ?sildenafil 100 MG tablet ?Commonly known as: Viagra ?Take 0.5-1 tablets (50-100 mg total) by mouth daily as needed for erectile dysfunction. ?  ? ?  ? ? ? ?OBJECTIVE:  ? ?Vital Signs: BP 126/78 (BP Location: Left Arm, Patient Position: Sitting, Cuff Size: Small)   Pulse 83   Ht '5\' 9"'  (1.753 m)   Wt 248 lb 3.2 oz (112.6 kg)   SpO2 96%   BMI 36.65 kg/m?   ?Wt Readings from Last 3 Encounters:  ?12/25/21 248 lb 3.2 oz (112.6 kg)  ?08/21/21 234 lb 12.8 oz (106.5 kg)  ?04/17/21 241 lb 6.4 oz (109.5 kg)  ? ? ? ?Exam: ?General: Pt appears well and is in NAD  ?Neck: General: Supple without adenopathy. ?Thyroid: Thyroid size normal.  No goiter or nodules appreciated.   ?Lungs: Clear with good BS bilat with no rales, rhonchi, or wheezes  ?Heart: RRR with normal S1 and S2 and no gallops; no murmurs; no rub  ?Abdomen: Normoactive bowel sounds, soft, nontender, without masses or organomegaly palpable  ?Extremities: No pretibial edema.  ?Neuro: MS is good with appropriate affect, pt is alert and Ox3  ? ? ?DM foot exam:  12/25/2021 ?  ?The skin of the feet is intact without sores or ulcerations. ?The pedal pulses are +1 on right and 1+ on left. ?The sensation is intact to a screening 5.07, 10 gram monofilament bilaterally ?  ? ? ?DATA REVIEWED: ? ?Lab Results  ?Component  Value Date  ? HGBA1C 7.1 (A) 12/25/2021  ? HGBA1C 7.6 (A) 08/21/2021  ? HGBA1C 7.8 (H) 03/17/2021  ? ? ? Latest Reference Range & Units 03/17/21 07:33  ?Sodium 135 - 145 mEq/L 140  ?Potassium 3.5 - 5.1 mEq/L 4.3  ?Chloride 96 - 112 mEq/L 103  ?  CO2 19 - 32 mEq/L 29  ?Glucose 70 - 99 mg/dL 220 (H)  ?BUN 6 - 23 mg/dL 10  ?Creatinine 0.40 - 1.50 mg/dL 0.83  ?Calcium 8.4 - 10.5 mg/dL 8.9  ?Alkaline Phosphatase 39 - 117 U/L 76  ?Albumin 3.5 - 5.2 g/dL 4.2  ?AST 0 - 37 U/L 13  ?ALT 0 - 53 U/L 10  ?Total Protein 6.0 - 8.3 g/dL 6.8  ?Bilirubin, Direct 0.0 - 0.3 mg/dL 0.2  ?Total Bilirubin 0.2 - 1.2 mg/dL 0.8  ?GFR >60.00 mL/min 94.19  ?Total CHOL/HDL Ratio  3  ?Cholesterol 0 - 200 mg/dL 120  ?HDL Cholesterol >39.00 mg/dL 34.70 (L)  ?LDL (calc) 0 - 99 mg/dL 60  ?NonHDL  85.76  ?Triglycerides 0.0 - 149.0 mg/dL 128.0  ?VLDL 0.0 - 40.0 mg/dL 25.6  ? ?ASSESSMENT / PLAN / RECOMMENDATIONS:  ? ?1) Type 2 Diabetes Mellitus,  Sub-Optimally Controlled , Without  complications - Most recent A1c of 7.1 %. Goal A1c < 7.0 %.   ? ?-I have praised the patient improved glycemic control ?-Switch metformin to XR has improved his GI symptoms ?-I have recommended increasing his suppertime glipizide ? ? ? ?MEDICATIONS: ?- Continue  Pioglitazone 45 mg, 1 tablet daily  ?- Continue Rybelsus 14 mg , 1 tablet with Breakfast  ?- Increase Glipizide 5 mg, 1.5 tablets before breakfast and 2 tablets before supper  ?- Continue metformin 500 mg  XR , 2 tablet twice daily  ? ? ?EDUCATION / INSTRUCTIONS: ?BG monitoring instructions: Patient is instructed to check his blood sugars 1 times a day, before breakfast ?Call [facility] if: BG persistently < 70 ?I reviewed the Rule of 15 for the treatment of hypoglycemia in detail with the patient. Literature supplied. ? ? ? ?2) Diabetic complications:  ?Eye: Does not have known diabetic retinopathy.  ?Neuro/ Feet: Does not have known diabetic peripheral neuropathy .  ?Renal: Patient does not have  known baseline CKD. He   is not on an ACEI/ARB at present.  ? ? ? ? ? ?F/U in 6 months   ? ? ?Signed electronically by: ?Abby Nena Jordan, MD ? ?[facility]  ?[facility] ?[place]

## 2021-12-25 NOTE — Patient Instructions (Addendum)
-   Continue Pioglitazone to 45  mg, 1 tablet before breakfast  ?- Increase Glipizide 5 mg, to 1.5 tablets before Breakfast and 2 tablets before Supper ?- Continue Metformin to 500 mg XR, 2 tablets with Breakfast and Supper  ?- Continue Rybelsus 14  mg , 1 tablet before  Breakfast ? ? ? ?HOW TO TREAT LOW BLOOD SUGARS (Blood sugar LESS THAN 70 MG/DL) ?Please follow the RULE OF 15 for the treatment of hypoglycemia treatment (when your (blood sugars are less than 70 mg/dL)  ? ?STEP 1: Take 15 grams of carbohydrates when your blood sugar is low, which includes:  ?3-4 GLUCOSE TABS  OR ?3-4 OZ OF JUICE OR REGULAR SODA OR ?ONE TUBE OF GLUCOSE GEL   ? ?STEP 2: RECHECK blood sugar in 15 MINUTES ?STEP 3: If your blood sugar is still low at the 15 minute recheck --> then, go back to STEP 1 and treat AGAIN with another 15 grams of carbohydrates. ? ?

## 2022-01-28 ENCOUNTER — Other Ambulatory Visit: Payer: Self-pay | Admitting: Internal Medicine

## 2022-01-28 DIAGNOSIS — E1165 Type 2 diabetes mellitus with hyperglycemia: Secondary | ICD-10-CM

## 2022-02-28 ENCOUNTER — Other Ambulatory Visit: Payer: Self-pay | Admitting: Internal Medicine

## 2022-03-22 ENCOUNTER — Telehealth: Payer: Self-pay | Admitting: Internal Medicine

## 2022-03-22 DIAGNOSIS — E785 Hyperlipidemia, unspecified: Secondary | ICD-10-CM

## 2022-03-22 DIAGNOSIS — Z125 Encounter for screening for malignant neoplasm of prostate: Secondary | ICD-10-CM

## 2022-03-22 DIAGNOSIS — Z0000 Encounter for general adult medical examination without abnormal findings: Secondary | ICD-10-CM

## 2022-03-22 DIAGNOSIS — E1165 Type 2 diabetes mellitus with hyperglycemia: Secondary | ICD-10-CM

## 2022-03-22 DIAGNOSIS — I10 Essential (primary) hypertension: Secondary | ICD-10-CM

## 2022-03-22 NOTE — Telephone Encounter (Signed)
Entered cpx labs. Made lab appt for Friday.../l;mb

## 2022-03-22 NOTE — Telephone Encounter (Signed)
Patient would like orders put in for his labs prior to his physical on the 15th.  He would like to come in on Friday before at 8:00 am.

## 2022-03-26 ENCOUNTER — Other Ambulatory Visit (INDEPENDENT_AMBULATORY_CARE_PROVIDER_SITE_OTHER): Payer: No Typology Code available for payment source

## 2022-03-26 DIAGNOSIS — Z125 Encounter for screening for malignant neoplasm of prostate: Secondary | ICD-10-CM | POA: Diagnosis not present

## 2022-03-26 DIAGNOSIS — E785 Hyperlipidemia, unspecified: Secondary | ICD-10-CM

## 2022-03-26 DIAGNOSIS — Z0000 Encounter for general adult medical examination without abnormal findings: Secondary | ICD-10-CM

## 2022-03-26 LAB — CBC WITH DIFFERENTIAL/PLATELET
Basophils Absolute: 0 10*3/uL (ref 0.0–0.1)
Basophils Relative: 0.4 % (ref 0.0–3.0)
Eosinophils Absolute: 0.2 10*3/uL (ref 0.0–0.7)
Eosinophils Relative: 2.8 % (ref 0.0–5.0)
HCT: 44.4 % (ref 39.0–52.0)
Hemoglobin: 15 g/dL (ref 13.0–17.0)
Lymphocytes Relative: 28.3 % (ref 12.0–46.0)
Lymphs Abs: 1.9 10*3/uL (ref 0.7–4.0)
MCHC: 33.8 g/dL (ref 30.0–36.0)
MCV: 87.5 fl (ref 78.0–100.0)
Monocytes Absolute: 0.4 10*3/uL (ref 0.1–1.0)
Monocytes Relative: 5.3 % (ref 3.0–12.0)
Neutro Abs: 4.3 10*3/uL (ref 1.4–7.7)
Neutrophils Relative %: 63.2 % (ref 43.0–77.0)
Platelets: 163 10*3/uL (ref 150.0–400.0)
RBC: 5.08 Mil/uL (ref 4.22–5.81)
RDW: 13.5 % (ref 11.5–15.5)
WBC: 6.8 10*3/uL (ref 4.0–10.5)

## 2022-03-26 LAB — MICROALBUMIN / CREATININE URINE RATIO
Creatinine,U: 190 mg/dL
Microalb Creat Ratio: 0.5 mg/g (ref 0.0–30.0)
Microalb, Ur: 1 mg/dL (ref 0.0–1.9)

## 2022-03-26 LAB — LIPID PANEL
Cholesterol: 119 mg/dL (ref 0–200)
HDL: 35.9 mg/dL — ABNORMAL LOW (ref >39.00)
LDL Cholesterol: 55 mg/dL (ref 0–99)
NonHDL: 83.52
Total CHOL/HDL Ratio: 3
Triglycerides: 145 mg/dL (ref 0.0–149.0)
VLDL: 29 mg/dL (ref 0.0–40.0)

## 2022-03-26 LAB — TSH: TSH: 1.21 u[IU]/mL (ref 0.35–5.50)

## 2022-03-26 LAB — PSA: PSA: 0.55 ng/mL (ref 0.10–4.00)

## 2022-03-30 ENCOUNTER — Encounter: Payer: Self-pay | Admitting: Internal Medicine

## 2022-03-30 ENCOUNTER — Ambulatory Visit (INDEPENDENT_AMBULATORY_CARE_PROVIDER_SITE_OTHER): Payer: No Typology Code available for payment source | Admitting: Internal Medicine

## 2022-03-30 DIAGNOSIS — E118 Type 2 diabetes mellitus with unspecified complications: Secondary | ICD-10-CM

## 2022-03-30 DIAGNOSIS — Z0000 Encounter for general adult medical examination without abnormal findings: Secondary | ICD-10-CM | POA: Diagnosis not present

## 2022-03-30 DIAGNOSIS — E785 Hyperlipidemia, unspecified: Secondary | ICD-10-CM

## 2022-03-30 DIAGNOSIS — E1165 Type 2 diabetes mellitus with hyperglycemia: Secondary | ICD-10-CM | POA: Diagnosis not present

## 2022-03-30 DIAGNOSIS — N529 Male erectile dysfunction, unspecified: Secondary | ICD-10-CM

## 2022-03-30 DIAGNOSIS — Z86010 Personal history of colonic polyps: Secondary | ICD-10-CM

## 2022-03-30 DIAGNOSIS — I10 Essential (primary) hypertension: Secondary | ICD-10-CM

## 2022-03-30 MED ORDER — STENDRA 100 MG PO TABS: 100.0000 mg | ORAL_TABLET | Freq: Every day | ORAL | 5 refills | Status: DC | PRN

## 2022-03-30 MED ORDER — IBUPROFEN 600 MG PO TABS: 600.0000 mg | ORAL_TABLET | Freq: Two times a day (BID) | ORAL | 0 refills | Status: DC | PRN

## 2022-03-30 NOTE — Progress Notes (Signed)
Subjective:  Patient ID: Gary Fowler, male    DOB: 1960/12/12  Age: 61 y.o. MRN: [id number]  CC: No chief complaint on file.   HPI Gary Fowler presents for a well exam  F/u DM, HTN  Outpatient Medications Prior to Visit  Medication Sig Dispense Refill   ACCU-CHEK GUIDE test strip USE AS DIRECTED 100 each 0   aspirin 81 MG chewable tablet Chew 81 mg by mouth daily.     atorvastatin (LIPITOR) 20 MG tablet TAKE 1 TABLET BY MOUTH ONCE DAILY AT  6PM 90 tablet 3   bisoprolol-hydrochlorothiazide (ZIAC) 10-6.25 MG tablet TAKE 1 TABLET BY MOUTH ONCE DAILY ANNUAL  APPOINTMENT  DUE  IN  AUGUST  MUST  SEE  PROVIDER  FOR  FUTURE  REFILLS 90 tablet 3   CALCIUM/MAGNESIUM/ZINC FORMULA PO Take by mouth.     fluorouracil (EFUDEX) 5 % cream APPLY A THIN LAYER ON THE SKIN TWICE DAILY TO AFFECTED AREA FOR 2 WEEKS  0   glipiZIDE (GLUCOTROL) 5 MG tablet Take 1.5 tablets (7.5 mg total) by mouth daily before breakfast AND 2 tablets (10 mg total) daily before supper. 315 tablet 3   metFORMIN (GLUCOPHAGE-XR) 500 MG 24 hr tablet Take 2 tablets (1,000 mg total) by mouth 2 (two) times daily. 360 tablet 1   Multiple Vitamin (MULTIVITAMIN) tablet Take 1 tablet by mouth daily.     pioglitazone (ACTOS) 45 MG tablet Take 1 tablet (45 mg total) by mouth daily. 90 tablet 3   Semaglutide (RYBELSUS) 14 MG TABS Take 14 mg by mouth daily. 90 tablet 3   sildenafil (VIAGRA) 100 MG tablet Take 0.5-1 tablets (50-100 mg total) by mouth daily as needed for erectile dysfunction. 12 tablet 5   BINAXNOW COVID-19 AG HOME TEST KIT      ibuprofen (ADVIL,MOTRIN) 600 MG tablet Take 1 tablet (600 mg total) by mouth 2 (two) times daily as needed. for pain 180 tablet 0   No facility-administered medications prior to visit.    ROS: Review of Systems  Constitutional:  Negative for appetite change, fatigue and unexpected weight change.  HENT:  Negative for congestion, nosebleeds, sneezing, sore throat and trouble swallowing.    Eyes:  Negative for itching and visual disturbance.  Respiratory:  Negative for cough.   Cardiovascular:  Negative for chest pain, palpitations and leg swelling.  Gastrointestinal:  Negative for abdominal distention, blood in stool, diarrhea and nausea.  Genitourinary:  Negative for frequency and hematuria.  Musculoskeletal:  Positive for arthralgias. Negative for back pain, gait problem, joint swelling and neck pain.  Skin:  Negative for rash.  Neurological:  Negative for dizziness, tremors, speech difficulty and weakness.  Psychiatric/Behavioral:  Negative for agitation, dysphoric mood, sleep disturbance and suicidal ideas. The patient is not nervous/anxious.     Objective:  BP 110/60 (BP Location: Left Arm, Patient Position: Sitting, Cuff Size: Large)   Pulse 63   Temp 97.8 F (36.6 C) (Oral)   Ht '5\' 9"'  (1.753 m)   Wt 248 lb (112.5 kg)   SpO2 98%   BMI 36.62 kg/m   BP Readings from Last 3 Encounters:  03/30/22 110/60  12/25/21 126/78  08/21/21 128/80    Wt Readings from Last 3 Encounters:  03/30/22 248 lb (112.5 kg)  12/25/21 248 lb 3.2 oz (112.6 kg)  08/21/21 234 lb 12.8 oz (106.5 kg)    Physical Exam Constitutional:      General: He is not in acute distress.  Appearance: He is well-developed. He is obese.     Comments: NAD  Eyes:     Conjunctiva/sclera: Conjunctivae normal.     Pupils: Pupils are equal, round, and reactive to light.  Neck:     Thyroid: No thyromegaly.     Vascular: No JVD.  Cardiovascular:     Rate and Rhythm: Normal rate and regular rhythm.     Heart sounds: Normal heart sounds. No murmur heard.    No friction rub. No gallop.  Pulmonary:     Effort: Pulmonary effort is normal. No respiratory distress.     Breath sounds: Normal breath sounds. No wheezing or rales.  Chest:     Chest wall: No tenderness.  Abdominal:     General: Bowel sounds are normal. There is no distension.     Palpations: Abdomen is soft. There is no mass.      Tenderness: There is no abdominal tenderness. There is no guarding or rebound.  Musculoskeletal:        General: No tenderness. Normal range of motion.     Cervical back: Normal range of motion.  Lymphadenopathy:     Cervical: No cervical adenopathy.  Skin:    General: Skin is warm and dry.     Findings: No rash.  Neurological:     Mental Status: He is alert and oriented to person, place, and time.     Cranial Nerves: No cranial nerve deficit.     Motor: No abnormal muscle tone.     Coordination: Coordination normal.     Gait: Gait normal.     Deep Tendon Reflexes: Reflexes are normal and symmetric.  Psychiatric:        Behavior: Behavior normal.        Thought Content: Thought content normal.        Judgment: Judgment normal.   Rectal per GI  Lab Results  Component Value Date   WBC 6.8 03/26/2022   HGB 15.0 03/26/2022   HCT 44.4 03/26/2022   PLT 163.0 03/26/2022   GLUCOSE 220 (H) 03/17/2021   CHOL 119 03/26/2022   TRIG 145.0 03/26/2022   HDL 35.90 (L) 03/26/2022   LDLDIRECT 56.3 11/20/2012   LDLCALC 55 03/26/2022   ALT 10 03/17/2021   AST 13 03/17/2021   NA 140 03/17/2021   K 4.3 03/17/2021   CL 103 03/17/2021   CREATININE 0.83 03/17/2021   BUN 10 03/17/2021   CO2 29 03/17/2021   TSH 1.21 03/26/2022   PSA 0.55 03/26/2022   HGBA1C 7.1 (A) 12/25/2021   MICROALBUR 1.0 03/26/2022    No results found.  Assessment & Plan:   Problem List Items Addressed This Visit     Dyslipidemia     CT calcium scoring test was offered      ERECTILE DYSFUNCTION    On Viagra - not effective Try Stendra. Hold BP Rx x 1-2 d prn Testosterone was nl      Essential hypertension    ED: Hold BP Rx x 1-2 d prn      Relevant Medications   Avanafil (STENDRA) 100 MG TABS   History of colon polyps     Pt had it at [facility] in 12/2021 due to insurance - 1 polyp. Due in 2031      Hyperglycemia due to type 2 diabetes mellitus (HCC)    Monitor A1c      Type II diabetes mellitus with  manifestations (HCC)     CT calcium scoring test was  offered Check A1c      Well adult exam     We discussed age appropriate health related issues, including available/recomended screening tests and vaccinations. Labs were ordered to be later reviewed . All questions were answered. We discussed one or more of the following - seat belt use, use of sunscreen/sun exposure exercise,  fall risk reduction, second hand smoke exposure, firearm use and storage, seat belt use, a need for adhering to healthy diet and exercise. Labs were ordered.  All questions were answered. [facility] advised 12/19, 2020, 2022 CT calcium scoring test was offered Colon due 2022 Dr Ardis Hughs.  Pt had it at [facility] in 12/2021 due to insurance - 1 polyp. Due in 2031         Meds ordered this encounter  Medications   ibuprofen (ADVIL) 600 MG tablet    Sig: Take 1 tablet (600 mg total) by mouth 2 (two) times daily as needed for moderate pain. for pain    Dispense:  180 tablet    Refill:  0   Avanafil (STENDRA) 100 MG TABS    Sig: Take 100 mg by mouth daily as needed.    Dispense:  20 tablet    Refill:  5      Follow-up: Return in about 3 months (around 06/30/2022) for a follow-up visit.  Walker Kehr, MD

## 2022-03-30 NOTE — Assessment & Plan Note (Signed)
CT calcium scoring test was offered Check A1c

## 2022-03-30 NOTE — Assessment & Plan Note (Signed)
Pt had it at [facility] in 12/2021 due to insurance - 1 polyp. Due in 2031

## 2022-03-30 NOTE — Assessment & Plan Note (Signed)
ED: Hold BP Rx x 1-2 d prn

## 2022-03-30 NOTE — Assessment & Plan Note (Signed)
On Viagra - not effective Try Stendra. Hold BP Rx x 1-2 d prn Testosterone was nl

## 2022-03-30 NOTE — Assessment & Plan Note (Addendum)
  We discussed age appropriate health related issues, including available/recomended screening tests and vaccinations. Labs were ordered to be later reviewed . All questions were answered. We discussed one or more of the following - seat belt use, use of sunscreen/sun exposure exercise,  fall risk reduction, second hand smoke exposure, firearm use and storage, seat belt use, a need for adhering to healthy diet and exercise. Labs were ordered.  All questions were answered. [facility] advised 12/19, 2020, 2022 CT calcium scoring test was offered Colon due 2022 Dr Ardis Hughs.  Pt had it at [facility] in 12/2021 due to insurance - 1 polyp. Due in 2031

## 2022-03-30 NOTE — Assessment & Plan Note (Signed)
Monitor A1c 

## 2022-03-30 NOTE — Assessment & Plan Note (Signed)
CT calcium scoring test was offered

## 2022-04-12 ENCOUNTER — Telehealth: Payer: Self-pay | Admitting: Internal Medicine

## 2022-04-12 DIAGNOSIS — E785 Hyperlipidemia, unspecified: Secondary | ICD-10-CM

## 2022-04-12 DIAGNOSIS — E1165 Type 2 diabetes mellitus with hyperglycemia: Secondary | ICD-10-CM

## 2022-04-12 NOTE — Telephone Encounter (Signed)
Patient called and states that he thinks Dr. Alain Marion was going to order some cardiac testing during his last visit. I don't see a referral in the system. Patient requests call back for clarification at [phone].

## 2022-04-13 NOTE — Telephone Encounter (Signed)
Notified pt w/MD response.../lmb 

## 2022-04-13 NOTE — Telephone Encounter (Signed)
Was supposed to let me know if he wants this test.  I ordered coronary calcium CT scan today.  They will call him.  Thanks

## 2022-04-28 ENCOUNTER — Other Ambulatory Visit: Payer: Self-pay | Admitting: Internal Medicine

## 2022-04-28 DIAGNOSIS — E1165 Type 2 diabetes mellitus with hyperglycemia: Secondary | ICD-10-CM

## 2022-05-03 ENCOUNTER — Other Ambulatory Visit: Payer: Self-pay | Admitting: Internal Medicine

## 2022-05-07 ENCOUNTER — Telehealth: Payer: Self-pay | Admitting: *Deleted

## 2022-05-07 NOTE — Telephone Encounter (Signed)
Pt need PA for Stendra 100 mg. Can not do PA on cover-my-meds. Have to go to Prompt [email or web address] on response from insurance./lmb  Thank you for creating a prior authorization request using PromptPA. The prior authorization department will contact you if additional information is needed. Once a decision is made you will be notified of the decision. You can check the status of your request using the Check Status link. Please note down (Prior Auth EOC ID) to check status. Prior Authorization request details: Prior Auth (EOC) ID: [id number] Drug/Service Name: [id number] 100 [place] TABLET Patient: Gary Fowler Date Requested: 05/07/2022 12:00:55 PM   MemberID: [id number] DOB: [birth year]

## 2022-05-07 NOTE — Telephone Encounter (Signed)
Rec'd determination med was DENIED. It states plan prefers generic Tadalafil or Sildenafil. Plan benefit allows 10 tab for 30 days...Gary Fowler

## 2022-05-09 NOTE — Telephone Encounter (Signed)
Noted. Thanks.

## 2022-06-07 ENCOUNTER — Other Ambulatory Visit: Payer: Self-pay | Admitting: Internal Medicine

## 2022-07-05 ENCOUNTER — Other Ambulatory Visit: Payer: No Typology Code available for payment source

## 2022-07-23 ENCOUNTER — Ambulatory Visit
Admission: RE | Admit: 2022-07-23 | Discharge: 2022-07-23 | Disposition: A | Discharge status: Home / Self Care | Payer: No Typology Code available for payment source | Source: Ambulatory Visit | Attending: Internal Medicine | Admitting: Internal Medicine

## 2022-07-23 ENCOUNTER — Other Ambulatory Visit: Payer: No Typology Code available for payment source

## 2022-07-23 DIAGNOSIS — E1165 Type 2 diabetes mellitus with hyperglycemia: Secondary | ICD-10-CM

## 2022-07-23 DIAGNOSIS — E785 Hyperlipidemia, unspecified: Secondary | ICD-10-CM

## 2022-07-25 ENCOUNTER — Encounter: Payer: Self-pay | Admitting: Internal Medicine

## 2022-07-25 DIAGNOSIS — I2510 Atherosclerotic heart disease of native coronary artery without angina pectoris: Secondary | ICD-10-CM | POA: Insufficient documentation

## 2022-07-25 DIAGNOSIS — I700 Atherosclerosis of aorta: Secondary | ICD-10-CM | POA: Insufficient documentation

## 2022-08-19 NOTE — Progress Notes (Signed)
Name: Gary Fowler  Age/ Sex: 62 y.o., male   MRN/ DOB: [id number], 10/19/1960     PCP: Cassandria Anger, MD   Reason for Endocrinology Evaluation: Type 2 Diabetes Mellitus  Initial Endocrine Consultative Visit: 12/07/2019    PATIENT IDENTIFIER: Gary Fowler is a 62 y.o. male with a past medical history of HTN, T2DM, and dyslipidemia. The patient has followed with Endocrinology clinic since 12/07/2019 for consultative assistance with management of his diabetes.  DIABETIC HISTORY:  Gary Fowler was diagnosed with T2DM in 2008, . His hemoglobin A1c has ranged from 6.3% in 2016, peaking at 8.5% in 2021.  On his initial visit to our clinic his A1c was 8.5%. He was on Metformin, Glimepiride and Oseni.We switched Glimepiride to Glipizide and continued metformin and Oseni.   By 02/2020 we switched Oseni to Pioglitazone and Rybelsus   Testosterone checked due to complaints of ED 06/2020 with normal testosterone at 367 ng/dL    SUBJECTIVE:   During the last visit (12/25/2021): A1c 7.1 %.    Today (08/20/2022): Gary Fowler is here for a follow up on diabetes management.  He checks his blood sugars 2 times daily. The patient has not had hypoglycemic episodes since the last clinic visit.   Weight has been trending down  Has been stressed with health issues concerning his son that has been in and out of the hospital  Denies vomiting, diarrhea or constipation  He has been noted with urinary frequency depending on what her drinks     HOME DIABETES REGIMEN:  Metformin 500 mg XR, 2 tabs  BID  Glipizide 5 mg , 1.5 tabs QAM and 2 tabs QPM Pioglitazone 45 mg, 1 tablet daily  Rybelsus 14 mg daily      Statin: yes ACE-I/ARB: no    METER DOWNLOAD SUMMARY: unable to download 103 -184 mg/dL     DIABETIC COMPLICATIONS: Microvascular complications:    Denies: CKD, retinopapthy , neuropathy  Last eye exam: Completed  2022   Macrovascular complications:    Denies:  CAD, PVD, CVA    HISTORY:  Past Medical History:  Past Medical History:  Diagnosis Date   ED (erectile dysfunction)    Family history of prostate cancer    Hyperlipidemia    Hypertension    Insomnia    LBP (low back pain)    Mood swings    OSA (obstructive sleep apnea)    Skin cancer of nose 2008   Left   Type II or unspecified type diabetes mellitus without mention of complication, not stated as uncontrolled    Past Surgical History:  Past Surgical History:  Procedure Laterality Date   arm fracture     with plate   ARTHROSCOPY KNEE W/ DRILLING     rt. knee   bone spur removal     lt. shoulder   LUMBAR FUSION     LUMBAR LAMINECTOMY     SKIN CANCER EXCISION     face   Social History:  reports that he has quit smoking. His smokeless tobacco use includes chew. He reports that he does not drink alcohol and does not use drugs. Family History:  Family History  Problem Relation Age of Onset   Cancer Father        Melanoma   Hypertension Mother    Crohn's disease Paternal Uncle    Diabetes Maternal Grandfather    Diabetes Paternal Grandfather      HOME MEDICATIONS: Allergies as of 08/20/2022  Reactions   Morphine Sulfate    REACTION: headache        Medication List        Accurate as of August 20, 2022  8:02 AM. If you have any questions, ask your nurse or doctor.          Accu-Chek Guide test strip Generic drug: glucose blood USE AS DIRECTED   aspirin 81 MG chewable tablet Chew 81 mg by mouth daily.   atorvastatin 20 MG tablet Commonly known as: LIPITOR TAKE 1 TABLET BY MOUTH ONCE DAILY AT  6PM   bisoprolol-hydrochlorothiazide 10-6.25 MG tablet Commonly known as: ZIAC TAKE 1 TABLET BY MOUTH ONCE DAILY ANNUAL  APPOINTMENT  DUE  IN  AUGUST  MUST  SEE  PROVIDER  FOR  FUTURE  REFILLS   CALCIUM/MAGNESIUM/ZINC FORMULA PO Take by mouth.   fluorouracil 5 % cream Commonly known as: EFUDEX APPLY A THIN LAYER ON THE SKIN TWICE DAILY TO AFFECTED  AREA FOR 2 WEEKS   glipiZIDE 5 MG tablet Commonly known as: GLUCOTROL Take 1.5 tablets (7.5 mg total) by mouth daily before breakfast AND 2 tablets (10 mg total) daily before supper.   ibuprofen 600 MG tablet Commonly known as: ADVIL Take 1 tablet (600 mg total) by mouth 2 (two) times daily as needed for moderate pain. for pain   metFORMIN 500 MG 24 hr tablet Commonly known as: GLUCOPHAGE-XR Take 2 tablets by mouth twice daily   multivitamin tablet Take 1 tablet by mouth daily.   pioglitazone 45 MG tablet Commonly known as: Actos Take 1 tablet (45 mg total) by mouth daily.   Rybelsus 14 MG Tabs Generic drug: Semaglutide Take 1 tablet by mouth once daily   sildenafil 100 MG tablet Commonly known as: Viagra Take 0.5-1 tablets (50-100 mg total) by mouth daily as needed for erectile dysfunction.   Stendra 100 MG Tabs Generic drug: Avanafil Take 100 mg by mouth daily as needed.         OBJECTIVE:   Vital Signs: BP 114/70 (BP Location: Left Arm, Patient Position: Sitting, Cuff Size: Large)   Pulse 68   Ht '5\' 9"'$  (1.753 m)   Wt 246 lb (111.6 kg)   SpO2 95%   BMI 36.33 kg/m   Wt Readings from Last 3 Encounters:  08/20/22 246 lb (111.6 kg)  03/30/22 248 lb (112.5 kg)  12/25/21 248 lb 3.2 oz (112.6 kg)     Exam: General: Pt appears well and is in NAD  Neck: General: Supple without adenopathy. Thyroid: Thyroid size normal.  No goiter or nodules appreciated.   Lungs: Clear with good BS bilat with no rales, rhonchi, or wheezes  Heart: RRR with normal S1 and S2 and no gallops; no murmurs; no rub  Abdomen: Normoactive bowel sounds, soft, nontender, without masses or organomegaly palpable  Extremities: No pretibial edema.  Neuro: MS is good with appropriate affect, pt is alert and Ox3    DM foot exam:  12/25/2021   The skin of the feet is intact without sores or ulcerations. The pedal pulses are +1 on right and 1+ on left. The sensation is intact to a screening  5.07, 10 gram monofilament bilaterally     DATA REVIEWED:  Lab Results  Component Value Date   HGBA1C 7.3 (A) 08/20/2022   HGBA1C 7.1 (A) 12/25/2021   HGBA1C 7.6 (A) 08/21/2021    Latest Reference Range & Units 03/26/22 07:48  Total CHOL/HDL Ratio  3  Cholesterol 0 - 200 mg/dL  119  HDL Cholesterol >39.00 mg/dL 35.90 (L)  LDL (calc) 0 - 99 mg/dL 55  MICROALB/CREAT RATIO 0.0 - 30.0 mg/g 0.5  NonHDL  83.52  Triglycerides 0.0 - 149.0 mg/dL 145.0  VLDL 0.0 - 40.0 mg/dL 29.0  WBC 4.0 - 10.5 K/uL 6.8  RBC 4.22 - 5.81 Mil/uL 5.08  Hemoglobin 13.0 - 17.0 g/dL 15.0  HCT 39.0 - 52.0 % 44.4  MCV 78.0 - 100.0 fl 87.5  MCHC 30.0 - 36.0 g/dL 33.8  RDW 11.5 - 15.5 % 13.5  Platelets 150.0 - 400.0 K/uL 163.0  Neutrophils 43.0 - 77.0 % 63.2  Lymphocytes 12.0 - 46.0 % 28.3  Monocytes Relative 3.0 - 12.0 % 5.3  Eosinophil 0.0 - 5.0 % 2.8  Basophil 0.0 - 3.0 % 0.4  NEUT# 1.4 - 7.7 K/uL 4.3  Lymphocyte # 0.7 - 4.0 K/uL 1.9  Monocyte # 0.1 - 1.0 K/uL 0.4  Eosinophils Absolute 0.0 - 0.7 K/uL 0.2  Basophils Absolute 0.0 - 0.1 K/uL 0.0    Latest Reference Range & Units 03/26/22 07:48  Creatinine,U mg/dL 190.0  Microalb, Ur 0.0 - 1.9 mg/dL 1.0  MICROALB/CREAT RATIO 0.0 - 30.0 mg/g 0.5    ASSESSMENT / PLAN / RECOMMENDATIONS:   1) Type 2 Diabetes Mellitus,  Sub-Optimally Controlled , Without  complications - Most recent A1c of 7.3 %. Goal A1c < 7.0 %.    - A1c remains above goal  - We discussed adding SGLT-2i, cautioned against frequency and urinary symptoms  -Switch metformin to XR has improved his GI symptoms - Will reduce morning glipizide as below   MEDICATIONS:  - Start Jardiance 10 mg daily  - Continue  Pioglitazone 45 mg, 1 tablet daily  - Continue Rybelsus 14 mg , 1 tablet with Breakfast  - Decrease Glipizide 5 mg, 1 tablet before breakfast and 2 tablets before supper  - Continue metformin 500 mg  XR , 2 tablet twice daily    EDUCATION / INSTRUCTIONS: BG monitoring  instructions: Patient is instructed to check his blood sugars 1 times a day, before breakfast Call [facility] if: BG persistently < 70 I reviewed the Rule of 15 for the treatment of hypoglycemia in detail with the patient. Literature supplied.    2) Diabetic complications:  Eye: Does not have known diabetic retinopathy.  Neuro/ Feet: Does not have known diabetic peripheral neuropathy .  Renal: Patient does not have known baseline CKD. He   is not on an ACEI/ARB at present.       F/U in 6 months     Signed electronically by: Mack Guise, MD  [facility] End[facility]in[facility]  [facility] [address] [place], [place] [address] Phone: [phone] FAX: [phone]   CC: Cassandria Anger, MD [place] [address] Phone: [phone]  Fax: [phone]  Return to Endocrinology clinic as below: Future Appointments  Date Time Provider [place]  09/28/2022  7:50 AM Plotnikov, Evie Lacks, MD [facility] None

## 2022-08-20 ENCOUNTER — Ambulatory Visit (INDEPENDENT_AMBULATORY_CARE_PROVIDER_SITE_OTHER): Payer: Managed Care, Other (non HMO) | Admitting: Internal Medicine

## 2022-08-20 ENCOUNTER — Encounter: Payer: Self-pay | Admitting: Internal Medicine

## 2022-08-20 VITALS — BP 114/70 | HR 68 | Ht 69.0 in | Wt 246.0 lb

## 2022-08-20 DIAGNOSIS — E1165 Type 2 diabetes mellitus with hyperglycemia: Secondary | ICD-10-CM

## 2022-08-20 LAB — POCT GLYCOSYLATED HEMOGLOBIN (HGB A1C): Hemoglobin A1C: 7.3 % — AB (ref 4.0–5.6)

## 2022-08-20 MED ORDER — RYBELSUS 14 MG PO TABS: 1.0000 | ORAL_TABLET | Freq: Every day | ORAL | 3 refills | Status: DC

## 2022-08-20 MED ORDER — METFORMIN HCL ER 500 MG PO TB24: 1000.0000 mg | ORAL_TABLET | Freq: Two times a day (BID) | ORAL | 3 refills | Status: DC

## 2022-08-20 MED ORDER — GLIPIZIDE 5 MG PO TABS: ORAL_TABLET | ORAL | 3 refills | Status: DC

## 2022-08-20 MED ORDER — EMPAGLIFLOZIN 10 MG PO TABS: 10.0000 mg | ORAL_TABLET | Freq: Every day | ORAL | 3 refills | Status: DC

## 2022-08-20 MED ORDER — PIOGLITAZONE HCL 45 MG PO TABS: 45.0000 mg | ORAL_TABLET | Freq: Every day | ORAL | 3 refills | Status: DC

## 2022-08-20 NOTE — Patient Instructions (Signed)
-  Start Jardiance 10 mg, 1 tablet every morning  - Continue Pioglitazone to 45  mg, 1 tablet before breakfast  - Decrease Glipizide 5 mg, to 1 tablet before Breakfast and 2 tablets before Supper - Continue Metformin to 500 mg XR, 2 tablets with Breakfast and Supper  - Continue Rybelsus 14  mg , 1 tablet before  Breakfast    HOW TO TREAT LOW BLOOD SUGARS (Blood sugar LESS THAN 70 MG/DL) Please follow the RULE OF 15 for the treatment of hypoglycemia treatment (when your (blood sugars are less than 70 mg/dL)   STEP 1: Take 15 grams of carbohydrates when your blood sugar is low, which includes:  3-4 GLUCOSE TABS  OR 3-4 OZ OF JUICE OR REGULAR SODA OR ONE TUBE OF GLUCOSE GEL    STEP 2: RECHECK blood sugar in 15 MINUTES STEP 3: If your blood sugar is still low at the 15 minute recheck --> then, go back to STEP 1 and treat AGAIN with another 15 grams of carbohydrates.

## 2022-09-28 ENCOUNTER — Ambulatory Visit (INDEPENDENT_AMBULATORY_CARE_PROVIDER_SITE_OTHER): Payer: Managed Care, Other (non HMO) | Admitting: Internal Medicine

## 2022-09-28 ENCOUNTER — Encounter: Payer: Self-pay | Admitting: Internal Medicine

## 2022-09-28 VITALS — BP 138/80 | HR 62 | Temp 97.9°F | Ht 69.0 in | Wt 243.0 lb

## 2022-09-28 DIAGNOSIS — R252 Cramp and spasm: Secondary | ICD-10-CM | POA: Diagnosis not present

## 2022-09-28 DIAGNOSIS — I2510 Atherosclerotic heart disease of native coronary artery without angina pectoris: Secondary | ICD-10-CM | POA: Diagnosis not present

## 2022-09-28 DIAGNOSIS — E1165 Type 2 diabetes mellitus with hyperglycemia: Secondary | ICD-10-CM

## 2022-09-28 DIAGNOSIS — E785 Hyperlipidemia, unspecified: Secondary | ICD-10-CM

## 2022-09-28 DIAGNOSIS — I2583 Coronary atherosclerosis due to lipid rich plaque: Secondary | ICD-10-CM

## 2022-09-28 LAB — LIPID PANEL
Cholesterol: 137 mg/dL (ref 0–200)
HDL: 36.6 mg/dL — ABNORMAL LOW (ref >39.00)
LDL Cholesterol: 75 mg/dL (ref 0–99)
NonHDL: 100.61
Total CHOL/HDL Ratio: 4
Triglycerides: 128 mg/dL (ref 0.0–149.0)
VLDL: 25.6 mg/dL (ref 0.0–40.0)

## 2022-09-28 LAB — COMPREHENSIVE METABOLIC PANEL
ALT: 12 U/L (ref 0–53)
AST: 17 U/L (ref 0–37)
Albumin: 4.2 g/dL (ref 3.5–5.2)
Alkaline Phosphatase: 75 U/L (ref 39–117)
BUN: 10 mg/dL (ref 6–23)
CO2: 29 mEq/L (ref 19–32)
Calcium: 9.1 mg/dL (ref 8.4–10.5)
Chloride: 104 mEq/L (ref 96–112)
Creatinine, Ser: 0.88 mg/dL (ref 0.40–1.50)
GFR: 91.55 mL/min (ref >60.00)
Glucose, Bld: 127 mg/dL — ABNORMAL HIGH (ref 70–99)
Potassium: 3.9 mEq/L (ref 3.5–5.1)
Sodium: 141 mEq/L (ref 135–145)
Total Bilirubin: 0.8 mg/dL (ref 0.2–1.2)
Total Protein: 6.8 g/dL (ref 6.0–8.3)

## 2022-09-28 LAB — CK: Total CK: 64 U/L (ref 7–232)

## 2022-09-28 NOTE — Progress Notes (Signed)
Subjective:  Patient ID: Gary Fowler, male    DOB: [birth year]  Age: 62 y.o. MRN: [id number]  CC: Follow-up (No concerns )   HPI Panagiotis D Thursby presents for DM, dyslipidemia, HTN, CAD  Outpatient Medications Prior to Visit  Medication Sig Dispense Refill   ACCU-CHEK GUIDE test strip USE AS DIRECTED 100 each 3   aspirin 81 MG chewable tablet Chew 81 mg by mouth daily.     atorvastatin (LIPITOR) 20 MG tablet TAKE 1 TABLET BY MOUTH ONCE DAILY AT  6PM 90 tablet 3   Avanafil (STENDRA) 100 MG TABS Take 100 mg by mouth daily as needed. 20 tablet 5   bisoprolol-hydrochlorothiazide (ZIAC) 10-6.25 MG tablet TAKE 1 TABLET BY MOUTH ONCE DAILY ANNUAL  APPOINTMENT  DUE  IN  AUGUST  MUST  SEE  PROVIDER  FOR  FUTURE  REFILLS 90 tablet 3   CALCIUM/MAGNESIUM/ZINC FORMULA PO Take by mouth.     empagliflozin (JARDIANCE) 10 MG TABS tablet Take 1 tablet (10 mg total) by mouth daily before breakfast. 90 tablet 3   fluorouracil (EFUDEX) 5 % cream APPLY A THIN LAYER ON THE SKIN TWICE DAILY TO AFFECTED AREA FOR 2 WEEKS  0   glipiZIDE (GLUCOTROL) 5 MG tablet Take 1 tablet (5 mg total) by mouth daily before breakfast AND 2 tablets (10 mg total) daily before supper. 270 tablet 3   ibuprofen (ADVIL) 600 MG tablet Take 1 tablet (600 mg total) by mouth 2 (two) times daily as needed for moderate pain. for pain 180 tablet 0   metFORMIN (GLUCOPHAGE-XR) 500 MG 24 hr tablet Take 2 tablets (1,000 mg total) by mouth 2 (two) times daily. 360 tablet 3   Multiple Vitamin (MULTIVITAMIN) tablet Take 1 tablet by mouth daily.     pioglitazone (ACTOS) 45 MG tablet Take 1 tablet (45 mg total) by mouth daily. 90 tablet 3   Semaglutide (RYBELSUS) 14 MG TABS Take 1 tablet (14 mg total) by mouth daily. 90 tablet 3   sildenafil (VIAGRA) 100 MG tablet Take 0.5-1 tablets (50-100 mg total) by mouth daily as needed for erectile dysfunction. 12 tablet 5   No facility-administered medications prior to visit.    ROS: Review of  Systems  Constitutional:  Negative for appetite change, fatigue and unexpected weight change.  HENT:  Negative for congestion, nosebleeds, sneezing, sore throat and trouble swallowing.   Eyes:  Negative for itching and visual disturbance.  Respiratory:  Negative for cough.   Cardiovascular:  Negative for chest pain, palpitations and leg swelling.  Gastrointestinal:  Negative for abdominal distention, blood in stool, diarrhea and nausea.  Genitourinary:  Negative for frequency and hematuria.  Musculoskeletal:  Negative for back pain, gait problem, joint swelling and neck pain.  Skin:  Negative for rash.  Neurological:  Negative for dizziness, tremors, speech difficulty and weakness.  Psychiatric/Behavioral:  Negative for agitation, dysphoric mood and sleep disturbance. The patient is not nervous/anxious.     Objective:  BP 138/80 (BP Location: Right Arm, Patient Position: Sitting, Cuff Size: Normal)   Pulse 62   Temp 97.9 F (36.6 C) (Oral)   Ht 5' 9"$  (1.753 m)   Wt 243 lb (110.2 kg)   SpO2 97%   BMI 35.88 kg/m   BP Readings from Last 3 Encounters:  09/28/22 138/80  08/20/22 114/70  03/30/22 110/60    Wt Readings from Last 3 Encounters:  09/28/22 243 lb (110.2 kg)  08/20/22 246 lb (111.6 kg)  03/30/22 248 lb (112.5  kg)    Physical Exam Constitutional:      General: He is not in acute distress.    Appearance: He is well-developed. He is obese.     Comments: NAD  Eyes:     Conjunctiva/sclera: Conjunctivae normal.     Pupils: Pupils are equal, round, and reactive to light.  Neck:     Thyroid: No thyromegaly.     Vascular: No JVD.  Cardiovascular:     Rate and Rhythm: Normal rate and regular rhythm.     Heart sounds: Normal heart sounds. No murmur heard.    No friction rub. No gallop.  Pulmonary:     Effort: Pulmonary effort is normal. No respiratory distress.     Breath sounds: Normal breath sounds. No wheezing or rales.  Chest:     Chest wall: No tenderness.   Abdominal:     General: Bowel sounds are normal. There is no distension.     Palpations: Abdomen is soft. There is no mass.     Tenderness: There is no abdominal tenderness. There is no guarding or rebound.  Musculoskeletal:        General: No tenderness. Normal range of motion.     Cervical back: Normal range of motion.  Lymphadenopathy:     Cervical: No cervical adenopathy.  Skin:    General: Skin is warm and dry.     Findings: No rash.  Neurological:     Mental Status: He is alert and oriented to person, place, and time.     Cranial Nerves: No cranial nerve deficit.     Motor: No abnormal muscle tone.     Coordination: Coordination normal.     Gait: Gait normal.     Deep Tendon Reflexes: Reflexes are normal and symmetric.  Psychiatric:        Behavior: Behavior normal.        Thought Content: Thought content normal.        Judgment: Judgment normal.     Lab Results  Component Value Date   WBC 6.8 03/26/2022   HGB 15.0 03/26/2022   HCT 44.4 03/26/2022   PLT 163.0 03/26/2022   GLUCOSE 220 (H) 03/17/2021   CHOL 119 03/26/2022   TRIG 145.0 03/26/2022   HDL 35.90 (L) 03/26/2022   LDLDIRECT 56.3 11/20/2012   LDLCALC 55 03/26/2022   ALT 10 03/17/2021   AST 13 03/17/2021   NA 140 03/17/2021   K 4.3 03/17/2021   CL 103 03/17/2021   CREATININE 0.83 03/17/2021   BUN 10 03/17/2021   CO2 29 03/17/2021   TSH 1.21 03/26/2022   PSA 0.55 03/26/2022   HGBA1C 7.3 (A) 08/20/2022   MICROALBUR 1.0 03/26/2022    CT CARDIAC SCORING ([facility] LOCATIONS ONLY)  Result Date: 07/24/2022 CLINICAL DATA:  Dyslipidemia * Tracking Code: [place] * EXAM: CT CARDIAC CORONARY ARTERY CALCIUM SCORE TECHNIQUE: Non-contrast imaging through the heart was performed using prospective ECG gating. Image post processing was performed on an independent workstation, allowing for quantitative analysis of the heart and coronary arteries. Note that this exam targets the heart and the chest was not imaged in its  entirety. COMPARISON:  None available. FINDINGS: CORONARY CALCIUM SCORES: Left Main: 0 LAD: 2.3 LCx: 7.9 RCA: 0 Total Agatston Score: 10.2 MESA database percentile: 32 AORTA MEASUREMENTS: Ascending Aorta: 38 cm Descending Aorta:25 cm OTHER FINDINGS: Heart is normal size. Aorta normal caliber. Scattered calcifications in the aortic root. No adenopathy. No confluent opacities or effusions. No acute findings in the  upper abdomen. Chest wall soft tissues are unremarkable. No acute bony abnormality. IMPRESSION: Total Agatston score: 10.2 [place] database percentile: 32 Scattered calcifications in the aortic root. No acute extra cardiac abnormality. Electronically Signed   By: Rolm Baptise M.D.   On: 07/24/2022 19:12    Assessment & Plan:   Problem List Items Addressed This Visit       Cardiovascular and Mediastinum   Coronary atherosclerosis    On Lipitor and aspirin        Endocrine   Type 2 diabetes mellitus with hyperglycemia, without long-term current use of insulin (HCC) - Primary    On Rybelsus, Metformin, Jardiance, Glucotrol, Acos      Relevant Orders   Comprehensive metabolic panel   CK   Lipid panel     Other   Dyslipidemia    On Lipitor -      Relevant Orders   Comprehensive metabolic panel   CK   Lipid panel   Cramps, extremity    Try magnesium oil spray for cramps Reduce Lipitor to qod      Relevant Orders   Comprehensive metabolic panel   CK      No orders of the defined types were placed in this encounter.     Follow-up: Return in about 6 months (around 03/29/2023) for Wellness Exam.  Walker Kehr, MD

## 2022-09-28 NOTE — Assessment & Plan Note (Signed)
Try magnesium oil spray for cramps Reduce Lipitor to qod

## 2022-09-28 NOTE — Assessment & Plan Note (Signed)
On Rybelsus, Metformin, Jardiance, Glucotrol, Acos

## 2022-09-28 NOTE — Patient Instructions (Signed)
Try magnesium oil spray for cramps

## 2022-09-28 NOTE — Assessment & Plan Note (Signed)
On Lipitor and aspirin

## 2022-09-28 NOTE — Assessment & Plan Note (Addendum)
On Rybelsus, Metformin, Jardiance, Glucotrol, Acos

## 2022-09-28 NOTE — Assessment & Plan Note (Signed)
On Lipitor 

## 2022-10-31 ENCOUNTER — Other Ambulatory Visit: Payer: Self-pay | Admitting: Internal Medicine

## 2022-12-30 ENCOUNTER — Ambulatory Visit
Admission: EM | Admit: 2022-12-30 | Discharge: 2022-12-30 | Disposition: A | Discharge status: Home / Self Care | Payer: Managed Care, Other (non HMO) | Attending: Family Medicine | Admitting: Family Medicine

## 2022-12-30 DIAGNOSIS — L03116 Cellulitis of left lower limb: Secondary | ICD-10-CM | POA: Diagnosis not present

## 2022-12-30 MED ORDER — DOXYCYCLINE HYCLATE 100 MG PO CAPS: 100.0000 mg | ORAL_CAPSULE | Freq: Two times a day (BID) | ORAL | 0 refills | Status: AC

## 2022-12-30 NOTE — ED Provider Notes (Addendum)
[name]    CSN: [id number] Arrival date & time: 12/30/22  1625      History   Chief Complaint Chief Complaint  Patient presents with   Abscess    HPI Gary Fowler is a 62 y.o. male.    Abscess  Here for swelling and redness on his posterior left leg. He first started noticing it on May 13 and has been getting bigger.  No fever or chills.  He is allergic to morphine  He does have diabetes and control is fairly good lately.   Of note in 2019 he had an cellulitis that advanced to an abscess.  On review of the notes from March 2019 from his primary care office, he was initially placed on Bactrim.  Then a week later he had an abscess that had to be drained.  He was then placed on doxycycline  Past Medical History:  Diagnosis Date   ED (erectile dysfunction)    Family history of prostate cancer    Hyperlipidemia    Hypertension    Insomnia    LBP (low back pain)    Mood swings    OSA (obstructive sleep apnea)    Skin cancer of nose 2008   Left   Type II or unspecified type diabetes mellitus without mention of complication, not stated as uncontrolled     Patient Active Problem List   Diagnosis Date Noted   Coronary atherosclerosis 07/25/2022   Atherosclerosis of aorta (HCC) 07/25/2022   Hyperglycemia due to type 2 diabetes mellitus (HCC) 03/30/2022   History of colon polyps 03/30/2022   Bladder neck obstruction 03/18/2020   Type 2 diabetes mellitus with hyperglycemia, without long-term current use of insulin (HCC) 12/07/2019   Cramps, extremity 04/01/2016   Blood in urine 07/23/2015   Hematuria, gross 07/23/2015   Right nephrolithiasis 07/23/2015   Cerumen impaction 11/24/2012   Actinic keratoses 11/24/2012   Well adult exam 11/19/2011   Obesity 11/19/2011   Routine general medical examination at a health care facility 11/06/2010   Observation for suspected malignant neoplasm 11/06/2010   Type II diabetes mellitus with manifestations  (HCC) 11/06/2010   EPISTAXIS 11/04/2009   TOBACCO USE, QUIT 11/04/2009   LOW BACK PAIN 02/28/2009   ABDOMINAL PAIN, RIGHT LOWER QUADRANT 02/12/2008   Neoplasm of uncertain behavior of skin 01/26/2008   PNEUMONIA, ORGANISM UNSPECIFIED 01/09/2008   Febrile illness, acute 01/09/2008   COUGH 01/09/2008   Essential hypertension 09/25/2007   Dyslipidemia 06/27/2007   ERECTILE DYSFUNCTION 06/27/2007    Past Surgical History:  Procedure Laterality Date   arm fracture     with plate   ARTHROSCOPY KNEE W/ DRILLING     rt. knee   bone spur removal     lt. shoulder   LUMBAR FUSION     LUMBAR LAMINECTOMY     SKIN CANCER EXCISION     face       Home Medications    Prior to Admission medications   Medication Sig Start Date End Date Taking? Authorizing Provider  doxycycline (VIBRAMYCIN) 100 MG capsule Take 1 capsule (100 mg total) by mouth 2 (two) times daily for 7 days. 12/30/22 01/06/23 Yes Zenia Resides, MD  ACCU-CHEK GUIDE test strip USE AS DIRECTED 04/28/22   Shamleffer, Konrad Dolores, MD  aspirin 81 MG chewable tablet Chew 81 mg by mouth daily.    [provider]  atorvastatin (LIPITOR) 20 MG tablet TAKE 1 TABLET BY MOUTH ONCE DAILY AT  [facility] 09/15/21  Plotnikov, Georgina Quint, MD  Avanafil (STENDRA) 100 MG TABS Take 100 mg by mouth daily as needed. 03/30/22   Plotnikov, Georgina Quint, MD  bisoprolol-hydrochlorothiazide (ZIAC) 10-6.25 MG tablet TAKE 1 TABLET BY MOUTH ONCE DAILY ANNUAL  APPOINTMENT  DUE  IN  AUGUST  MUST  SEE  PROVIDER  FOR  FUTURE  REFILLS 09/15/21   Plotnikov, Georgina Quint, MD  CALCIUM/MAGNESIUM/ZINC FORMULA PO Take by mouth.    [provider]  empagliflozin (JARDIANCE) 10 MG TABS tablet Take 1 tablet (10 mg total) by mouth daily before breakfast. 08/20/22   Shamleffer, Konrad Dolores, MD  fluorouracil (EFUDEX) 5 % cream APPLY A THIN LAYER ON THE SKIN TWICE DAILY TO AFFECTED AREA FOR 2 WEEKS 03/21/18   [provider]  glipiZIDE (GLUCOTROL) 5 MG  tablet Take 1 tablet (5 mg total) by mouth daily before breakfast AND 2 tablets (10 mg total) daily before supper. 08/20/22   Shamleffer, Konrad Dolores, MD  ibuprofen (ADVIL) 600 MG tablet Take 1 tablet (600 mg total) by mouth 2 (two) times daily as needed for moderate pain. for pain 03/30/22   Plotnikov, Georgina Quint, MD  metFORMIN (GLUCOPHAGE-XR) 500 MG 24 hr tablet Take 2 tablets (1,000 mg total) by mouth 2 (two) times daily. 08/20/22   Shamleffer, Konrad Dolores, MD  Multiple Vitamin (MULTIVITAMIN) tablet Take 1 tablet by mouth daily.    [provider]  pioglitazone (ACTOS) 45 MG tablet Take 1 tablet (45 mg total) by mouth daily. 08/20/22   Shamleffer, Konrad Dolores, MD  Semaglutide (RYBELSUS) 14 MG TABS Take 1 tablet (14 mg total) by mouth daily. 08/20/22   Shamleffer, Konrad Dolores, MD  sildenafil (VIAGRA) 100 MG tablet TAKE 1/2 TO 1 (ONE-HALF TO ONE) TABLET BY MOUTH ONCE DAILY AS NEEDED FOR ERECTILE DYSFUNCTION 11/01/22   Plotnikov, Georgina Quint, MD    Family History Family History  Problem Relation Age of Onset   Cancer Father        Melanoma   Hypertension Mother    Crohn's disease Paternal Uncle    Diabetes Maternal Grandfather    Diabetes Paternal Grandfather     Social History Social History   Tobacco Use   Smoking status: Former   Smokeless tobacco: Current    Types: Chew  Substance Use Topics   Alcohol use: No   Drug use: No     Allergies   Morphine sulfate   Review of Systems Review of Systems   Physical Exam Triage Vital Signs ED Triage Vitals  Enc Vitals Group     BP 12/30/22 1705 133/81     Pulse Rate 12/30/22 1703 77     Resp 12/30/22 1703 16     Temp 12/30/22 1703 98.1 F (36.7 C)     Temp Source 12/30/22 1703 Oral     SpO2 12/30/22 1703 97 %     Weight --      Height --      Head Circumference --      Peak Flow --      Pain Score 12/30/22 1704 5     Pain Loc --      Pain Edu? --      Excl. in GC? --    No data found.  Updated Vital  Signs BP 133/81 (BP Location: Left Arm)   Pulse 77   Temp 98.1 F (36.7 C) (Oral)   Resp 16   SpO2 97%   Visual Acuity Right Eye Distance:   Left Eye  Distance:   Bilateral Distance:    Right Eye Near:   Left Eye Near:    Bilateral Near:     Physical Exam Vitals reviewed.  Constitutional:      General: He is not in acute distress.    Appearance: He is not ill-appearing, toxic-appearing or diaphoretic.  HENT:     Mouth/Throat:     Mouth: Mucous membranes are moist.  Skin:    Coloration: Skin is not jaundiced or pale.     Comments: On his posterior left thigh there is an area of erythema and induration and tenderness.  In the horizontal aspect it is 4 cm x 2.5 cm in the vertical aspect.  There is no fluctuance, but there is a little yellow eschar in the middle that is about 2 mm in diameter.  Neurological:     Mental Status: He is alert and oriented to person, place, and time.  Psychiatric:        Behavior: Behavior normal.      UC Treatments / Results  Labs (all labs ordered are listed, but only abnormal results are displayed) Labs Reviewed - No data to display  EKG   Radiology No results found.  Procedures Procedures (including critical care time)  Medications Ordered in UC Medications - No data to display  Initial Impression / Assessment and Plan / UC Course  I have reviewed the triage vital signs and the nursing notes.  Pertinent labs & imaging results that were available during my care of the patient were reviewed by me and considered in my medical decision making (see chart for details).        Doxycycline is sent in to treat the infection.   Final Clinical Impressions(s) / UC Diagnoses   Final diagnoses:  Cellulitis of left lower extremity     Discharge Instructions      Take doxycycline 100 mg --1 capsule 2 times daily for 7 days  Do warm soaks on the area about 3 times a day.  That helps the circulation and helps the antibiotics work  better.  Return to be seen if you are worsening or you can present to the emergency room to be seen if worsening.     ED Prescriptions     Medication Sig Dispense Auth. Provider   doxycycline (VIBRAMYCIN) 100 MG capsule Take 1 capsule (100 mg total) by mouth 2 (two) times daily for 7 days. 14 capsule Marlinda Mike, Janace Aris, MD      PDMP not reviewed this encounter.   Zenia Resides, MD 12/30/22 1742    Zenia Resides, MD 12/30/22 [phone]

## 2022-12-30 NOTE — Discharge Instructions (Addendum)
Take doxycycline 100 mg --1 capsule 2 times daily for 7 days  Do warm soaks on the area about 3 times a day.  That helps the circulation and helps the antibiotics work better.  Return to be seen if you are worsening or you can present to the emergency room to be seen if worsening.

## 2022-12-30 NOTE — ED Triage Notes (Signed)
Pt states abscess to the back of his left thigh for the past 3 days.

## 2022-12-31 ENCOUNTER — Other Ambulatory Visit: Payer: Self-pay | Admitting: Internal Medicine

## 2023-01-26 ENCOUNTER — Ambulatory Visit
Admission: EM | Admit: 2023-01-26 | Discharge: 2023-01-26 | Disposition: A | Discharge status: Home / Self Care | Payer: Managed Care, Other (non HMO) | Attending: Emergency Medicine | Admitting: Emergency Medicine

## 2023-01-26 DIAGNOSIS — L03317 Cellulitis of buttock: Secondary | ICD-10-CM

## 2023-01-26 MED ORDER — DOXYCYCLINE HYCLATE 100 MG PO CAPS: 100.0000 mg | ORAL_CAPSULE | Freq: Two times a day (BID) | ORAL | 0 refills | Status: DC

## 2023-01-26 NOTE — Discharge Instructions (Signed)
You are being treated for a skin infection on your right buttocks  Take doxycycline every morning and every evening for 10 days  May hold warm compresses over the affected area in 10 to 15-minute intervals  May take Tylenol and/or Motrin as needed for any discomfort  For any concerns regarding healing please return for reevaluation

## 2023-01-26 NOTE — ED Triage Notes (Signed)
Patient here today for an inflamed area on his right buttock since Sunday. The area is sore and areas to be getting a little bigger. 3 weeks ago he had an abscess on the back of his left leg and completed his antibiotics for that. That area has healed.

## 2023-01-26 NOTE — ED Provider Notes (Signed)
[name]    CSN: [id number] Arrival date & time: 01/26/23  1717      History   Chief Complaint Chief Complaint  Patient presents with   Abscess    Right buttock    HPI Gary Fowler is a 62 y.o. male.   For evaluation of erythematous and tender area to the right buttocks present for 3 days.  Has applied topical triple antibiotic ointment to the affected area with no improvement.  Endorses he had a similar dysuria to the left leg about 3 weeks ago which resolved after use of oral antibiotics.  Denies drainage or fever.   Past Medical History:  Diagnosis Date   ED (erectile dysfunction)    Family history of prostate cancer    Hyperlipidemia    Hypertension    Insomnia    LBP (low back pain)    Mood swings    OSA (obstructive sleep apnea)    Skin cancer of nose 2008   Left   Type II or unspecified type diabetes mellitus without mention of complication, not stated as uncontrolled     Patient Active Problem List   Diagnosis Date Noted   Coronary atherosclerosis 07/25/2022   Atherosclerosis of aorta (HCC) 07/25/2022   Hyperglycemia due to type 2 diabetes mellitus (HCC) 03/30/2022   History of colon polyps 03/30/2022   Bladder neck obstruction 03/18/2020   Type 2 diabetes mellitus with hyperglycemia, without long-term current use of insulin (HCC) 12/07/2019   Cramps, extremity 04/01/2016   Blood in urine 07/23/2015   Hematuria, gross 07/23/2015   Right nephrolithiasis 07/23/2015   Cerumen impaction 11/24/2012   Actinic keratoses 11/24/2012   Well adult exam 11/19/2011   Obesity 11/19/2011   Routine general medical examination at a health care facility 11/06/2010   Observation for suspected malignant neoplasm 11/06/2010   Type II diabetes mellitus with manifestations (HCC) 11/06/2010   EPISTAXIS 11/04/2009   TOBACCO USE, QUIT 11/04/2009   LOW BACK PAIN 02/28/2009   ABDOMINAL PAIN, RIGHT LOWER QUADRANT 02/12/2008   Neoplasm of uncertain  behavior of skin 01/26/2008   PNEUMONIA, ORGANISM UNSPECIFIED 01/09/2008   Febrile illness, acute 01/09/2008   COUGH 01/09/2008   Essential hypertension 09/25/2007   Dyslipidemia 06/27/2007   ERECTILE DYSFUNCTION 06/27/2007    Past Surgical History:  Procedure Laterality Date   arm fracture     with plate   ARTHROSCOPY KNEE W/ DRILLING     rt. knee   bone spur removal     lt. shoulder   LUMBAR FUSION     LUMBAR LAMINECTOMY     SKIN CANCER EXCISION     face       Home Medications    Prior to Admission medications   Medication Sig Start Date End Date Taking? Authorizing Provider  aspirin 81 MG chewable tablet Chew 81 mg by mouth daily.   Yes [provider]  atorvastatin (LIPITOR) 20 MG tablet TAKE 1 TABLET BY MOUTH ONCE DAILY AT  6PM 09/15/21  Yes Plotnikov, Georgina Quint, MD  bisoprolol-hydrochlorothiazide (ZIAC) 10-6.25 MG tablet TAKE 1 TABLET BY MOUTH ONCE DAILY ANNUAL  APPOINTMENT  DUE  IN  [place],  MUST  SEE  PROVIDER  FOR  FUTURE  REFILLS 01/03/23  Yes Plotnikov, Georgina Quint, MD  doxycycline (VIBRAMYCIN) 100 MG capsule Take 1 capsule (100 mg total) by mouth 2 (two) times daily. 01/26/23  Yes Dashel Goines R, NP  empagliflozin (JARDIANCE) 10 MG TABS tablet Take 1 tablet (10 mg total)  by mouth daily before breakfast. 08/20/22  Yes Shamleffer, Konrad Dolores, MD  glipiZIDE (GLUCOTROL) 5 MG tablet Take 1 tablet (5 mg total) by mouth daily before breakfast AND 2 tablets (10 mg total) daily before supper. 08/20/22  Yes Shamleffer, Konrad Dolores, MD  metFORMIN (GLUCOPHAGE-XR) 500 MG 24 hr tablet Take 2 tablets (1,000 mg total) by mouth 2 (two) times daily. 08/20/22  Yes Shamleffer, Konrad Dolores, MD  pioglitazone (ACTOS) 45 MG tablet Take 1 tablet (45 mg total) by mouth daily. 08/20/22  Yes Shamleffer, Konrad Dolores, MD  Semaglutide (RYBELSUS) 14 MG TABS Take 1 tablet (14 mg total) by mouth daily. 08/20/22  Yes Shamleffer, Konrad Dolores, MD  ACCU-CHEK GUIDE test strip USE AS  DIRECTED 04/28/22   Shamleffer, Konrad Dolores, MD  Avanafil (STENDRA) 100 MG TABS Take 100 mg by mouth daily as needed. 03/30/22   Plotnikov, Georgina Quint, MD  CALCIUM/MAGNESIUM/ZINC FORMULA PO Take by mouth.    [provider]  fluorouracil (EFUDEX) 5 % cream APPLY A THIN LAYER ON THE SKIN TWICE DAILY TO AFFECTED AREA FOR 2 WEEKS 03/21/18   [provider]  ibuprofen (ADVIL) 600 MG tablet Take 1 tablet (600 mg total) by mouth 2 (two) times daily as needed for moderate pain. for pain 03/30/22   Plotnikov, Georgina Quint, MD  Multiple Vitamin (MULTIVITAMIN) tablet Take 1 tablet by mouth daily.    [provider]  sildenafil (VIAGRA) 100 MG tablet TAKE 1/2 TO 1 (ONE-HALF TO ONE) TABLET BY MOUTH ONCE DAILY AS NEEDED FOR ERECTILE DYSFUNCTION 11/01/22   Plotnikov, Georgina Quint, MD    Family History Family History  Problem Relation Age of Onset   Cancer Father        Melanoma   Hypertension Mother    Crohn's disease Paternal Uncle    Diabetes Maternal Grandfather    Diabetes Paternal Grandfather     Social History Social History   Tobacco Use   Smoking status: Former   Smokeless tobacco: Current    Types: Chew  Substance Use Topics   Alcohol use: No   Drug use: No     Allergies   Morphine sulfate   Review of Systems Review of Systems   Physical Exam Triage Vital Signs ED Triage Vitals  Enc Vitals Group     BP 01/26/23 1727 131/74     Pulse Rate 01/26/23 1727 72     Resp 01/26/23 1727 18     Temp 01/26/23 1727 98.6 F (37 C)     Temp Source 01/26/23 1727 Oral     SpO2 01/26/23 1727 94 %     Weight 01/26/23 1727 235 lb (106.6 kg)     Height 01/26/23 1727 5\' 9"  (1.753 m)     Head Circumference --      Peak Flow --      Pain Score 01/26/23 1726 5     Pain Loc --      Pain Edu? --      Excl. in GC? --    No data found.  Updated Vital Signs BP 131/74 (BP Location: Left Arm)   Pulse 72   Temp 98.6 F (37 C) (Oral)   Resp 18   Ht 5\' 9"  (1.753 m)    Wt 235 lb (106.6 kg)   SpO2 94%   BMI 34.70 kg/m   Visual Acuity Right Eye Distance:   Left Eye Distance:   Bilateral Distance:    Right Eye Near:   Left Eye Near:  Bilateral Near:     Physical Exam Constitutional:      Appearance: Normal appearance.  Eyes:     Extraocular Movements: Extraocular movements intact.  Pulmonary:     Effort: Pulmonary effort is normal.  Skin:    Comments: 1 x 1 cm hyperpigmented papule present to the right buttocks with surrounding erythema, tender and skin hot to touch  Neurological:     Mental Status: He is alert and oriented to person, place, and time. Mental status is at baseline.      UC Treatments / Results  Labs (all labs ordered are listed, but only abnormal results are displayed) Labs Reviewed - No data to display  EKG   Radiology No results found.  Procedures Procedures (including critical care time)  Medications Ordered in UC Medications - No data to display  Initial Impression / Assessment and Plan / UC Course  I have reviewed the triage vital signs and the nursing notes.  Pertinent labs & imaging results that were available during my care of the patient were reviewed by me and considered in my medical decision making (see chart for details).  Cellulitis of the right buttock  Presentation consistent with infection, discussed with patient, prescribed doxycycline, extended course from 7 to 10 days as he had recent skin infection, recommend over-the-counter analgesics and warm compresses, advised to follow-up if symptoms persist or worsen or if any concern regarding healing Final Clinical Impressions(s) / UC Diagnoses   Final diagnoses:  Cellulitis of right buttock     Discharge Instructions      You are being treated for a skin infection on your right buttocks  Take doxycycline every morning and every evening for 10 days  May hold warm compresses over the affected area in 10 to 15-minute intervals  May take  Tylenol and/or Motrin as needed for any discomfort  For any concerns regarding healing please return for reevaluation   ED Prescriptions     Medication Sig Dispense Auth. Provider   doxycycline (VIBRAMYCIN) 100 MG capsule Take 1 capsule (100 mg total) by mouth 2 (two) times daily. 20 capsule Valinda Hoar, NP      PDMP not reviewed this encounter.   Valinda Hoar, NP 01/26/23 1742

## 2023-02-24 NOTE — Progress Notes (Signed)
Name: Gary Fowler  Age/ Sex: 62 y.o., male   MRN/ DOB: [id number], May 10, 1961     PCP: Tresa Garter, MD   Reason for Endocrinology Evaluation: Type 2 Diabetes Mellitus  Initial Endocrine Consultative Visit: 12/07/2019    PATIENT IDENTIFIER: Gary Fowler is a 62 y.o. male with a past medical history of HTN, T2DM, and dyslipidemia. The patient has followed with Endocrinology clinic since 12/07/2019 for consultative assistance with management of his diabetes.  DIABETIC HISTORY:  Gary Fowler was diagnosed with T2DM in 2008, . His hemoglobin A1c has ranged from 6.3% in 2016, peaking at 8.5% in 2021.  On his initial visit to our clinic his A1c was 8.5%. He was on Metformin, Glimepiride and Oseni.We switched Glimepiride to Glipizide and continued metformin and Oseni.   By 02/2020 we switched Oseni to Pioglitazone and Rybelsus   Testosterone checked due to complaints of ED 06/2020 with normal testosterone at 367 ng/dL    Jardiance started 04/5283  SUBJECTIVE:   During the last visit (08/20/2022): A1c 7.3 %.    Today (02/25/2023): Gary Fowler is here for a follow up on diabetes management.  He checks his blood sugars 2 times daily. The patient has not had hypoglycemic episodes since the last clinic visit.  Patient presented to urgent care with cellulitis in May and June 2024, this has resolved  Has noted aches and pain for the past month, seeing [facility] next Wednesday  He is on MVI   He denies nausea or vomiting He recently noted diarrhea after eating certain meals      HOME DIABETES REGIMEN:  Metformin 500 mg XR, 2 tabs  BID  Glipizide 5 mg , 1 tabs QAM and 2 tabs QPM Pioglitazone 45 mg, 1 tablet daily  Rybelsus 14 mg daily Jardiance 10 mg daily     Statin: yes ACE-I/ARB: no    METER DOWNLOAD SUMMARY: unable to download 85- 285mg /dL     DIABETIC COMPLICATIONS: Microvascular complications:    Denies: CKD, retinopapthy , neuropathy   Last eye exam: Completed  2022   Macrovascular complications:    Denies: CAD, PVD, CVA    HISTORY:  Past Medical History:  Past Medical History:  Diagnosis Date   ED (erectile dysfunction)    Family history of prostate cancer    Hyperlipidemia    Hypertension    Insomnia    LBP (low back pain)    Mood swings    OSA (obstructive sleep apnea)    Skin cancer of nose 2008   Left   Type II or unspecified type diabetes mellitus without mention of complication, not stated as uncontrolled    Past Surgical History:  Past Surgical History:  Procedure Laterality Date   arm fracture     with plate   ARTHROSCOPY KNEE W/ DRILLING     rt. knee   bone spur removal     lt. shoulder   LUMBAR FUSION     LUMBAR LAMINECTOMY     SKIN CANCER EXCISION     face   Social History:  reports that he has quit smoking. His smokeless tobacco use includes chew. He reports that he does not drink alcohol and does not use drugs. Family History:  Family History  Problem Relation Age of Onset   Cancer Father        Melanoma   Hypertension Mother    Crohn's disease Paternal Uncle    Diabetes Maternal Grandfather    Diabetes Paternal Grandfather  HOME MEDICATIONS: Allergies as of 02/25/2023       Reactions   Morphine Sulfate    REACTION: headache        Medication List        Accurate as of February 25, 2023  7:59 AM. If you have any questions, ask your nurse or doctor.          STOP taking these medications    doxycycline 100 MG capsule Commonly known as: VIBRAMYCIN Stopped by: Gary Fowler   Stendra 100 MG Tabs Generic drug: Avanafil Stopped by: Gary Fowler       TAKE these medications    Accu-Chek Guide test strip Generic drug: glucose blood USE AS DIRECTED   aspirin 81 MG chewable tablet Chew 81 mg by mouth daily.   atorvastatin 20 MG tablet Commonly known as: LIPITOR TAKE 1 TABLET BY MOUTH ONCE DAILY AT  6PM    bisoprolol-hydrochlorothiazide 10-6.25 MG tablet Commonly known as: ZIAC TAKE 1 TABLET BY MOUTH ONCE DAILY ANNUAL  APPOINTMENT  DUE  IN  [place],  MUST  SEE  PROVIDER  FOR  FUTURE  REFILLS   CALCIUM/MAGNESIUM/ZINC FORMULA PO Take by mouth.   empagliflozin 25 MG Tabs tablet Commonly known as: Jardiance Take 1 tablet (25 mg total) by mouth daily before breakfast. What changed:  medication strength how much to take Changed by: Gary Fowler   fluorouracil 5 % cream Commonly known as: EFUDEX APPLY A THIN LAYER ON THE SKIN TWICE DAILY TO AFFECTED AREA FOR 2 WEEKS   glipiZIDE 5 MG tablet Commonly known as: GLUCOTROL Take 1 tablet (5 mg total) by mouth 2 (two) times daily before a meal. What changed: See the new instructions. Changed by: Gary Fowler   ibuprofen 600 MG tablet Commonly known as: ADVIL Take 1 tablet (600 mg total) by mouth 2 (two) times daily as needed for moderate pain. for pain   metFORMIN 500 MG 24 hr tablet Commonly known as: GLUCOPHAGE-XR Take 2 tablets (1,000 mg total) by mouth 2 (two) times daily.   multivitamin tablet Take 1 tablet by mouth daily.   pioglitazone 45 MG tablet Commonly known as: Actos Take 1 tablet (45 mg total) by mouth daily.   Rybelsus 14 MG Tabs Generic drug: Semaglutide Take 1 tablet (14 mg total) by mouth daily.   sildenafil 100 MG tablet Commonly known as: VIAGRA TAKE 1/2 TO 1 (ONE-HALF TO ONE) TABLET BY MOUTH ONCE DAILY AS NEEDED FOR ERECTILE DYSFUNCTION         OBJECTIVE:   Vital Signs: BP 120/82 (BP Location: Left Arm, Patient Position: Sitting, Cuff Size: Large)   Pulse 65   Ht 5\' 9"  (1.753 m)   Wt 239 lb (108.4 kg)   SpO2 97%   BMI 35.29 kg/m   Wt Readings from Last 3 Encounters:  02/25/23 239 lb (108.4 kg)  01/26/23 235 lb (106.6 kg)  09/28/22 243 lb (110.2 kg)     Exam: General: Pt appears well and is in NAD  Lungs: Clear with good BS bilat   Heart: RRR with normal   Extremities: No  pretibial edema.  Neuro: MS is good with appropriate affect, pt is alert and Ox3    DM foot exam:  02/25/2023   The skin of the feet is intact without sores or ulcerations. The pedal pulses are +1 on right and 1+ on left. The sensation is intact to a screening 5.07, 10 gram monofilament bilaterally     DATA  REVIEWED:  Lab Results  Component Value Date   HGBA1C 6.8 (A) 02/25/2023   HGBA1C 7.3 (A) 08/20/2022   HGBA1C 7.1 (A) 12/25/2021    Latest Reference Range & Units 09/28/22 08:30  Sodium 135 - 145 mEq/L 141  Potassium 3.5 - 5.1 mEq/L 3.9  Chloride 96 - 112 mEq/L 104  CO2 19 - 32 mEq/L 29  Glucose 70 - 99 mg/dL 161 (H)  BUN 6 - 23 mg/dL 10  Creatinine 0.96 - 0.45 mg/dL 4.09  Calcium 8.4 - 81.1 mg/dL 9.1  Alkaline Phosphatase 39 - 117 U/L 75  Albumin 3.5 - 5.2 g/dL 4.2  AST 0 - 37 U/L 17  ALT 0 - 53 U/L 12  Total Protein 6.0 - 8.3 g/dL 6.8  Total Bilirubin 0.2 - 1.2 mg/dL 0.8  GFR >91.47 mL/min 91.55  CK Total 7 - 232 U/L 64  Total CHOL/HDL Ratio  4  Cholesterol 0 - 200 mg/dL 829  HDL Cholesterol >56.21 mg/dL 30.86 (L)  LDL (calc) 0 - 99 mg/dL 75  NonHDL  578.46  Triglycerides 0.0 - 149.0 mg/dL 962.9  VLDL 0.0 - 52.8 mg/dL 41.3  (H): Data is abnormally high (L): Data is abnormally low   ASSESSMENT / PLAN / RECOMMENDATIONS:   1) Type 2 Diabetes Mellitus,   Optimally Controlled , Without  complications - Most recent A1c of 6.8 %. Goal A1c < 7.0 %.    - A1c has trended down to goal -His diarrhea resolved with switching metformin to XR formulation, but he has noted recent recurrence of diarrhea, it is unclear if it is the metformin versus the Rybelsus, symptoms are tolerable at this time, we entertained switching to [facility] versus Ozempic in the future -In the meantime we will increase Jardiance and decrease glipizide as below  MEDICATIONS:  -Increase Jardiance 25 mg daily  - Continue  Pioglitazone 45 mg, 1 tablet daily  - Continue Rybelsus 14 mg , 1 tablet  with Breakfast  - Decrease Glipizide 5 mg, 1 tablet before breakfast and 1 tablet before supper  - Continue metformin 500 mg  XR , 2 tablet twice daily    EDUCATION / INSTRUCTIONS: BG monitoring instructions: Patient is instructed to check his blood sugars 1 times a day, before breakfast Call [place] [facility] if: BG persistently < 70 I reviewed the Rule of 15 for the treatment of hypoglycemia in detail with the patient. Literature supplied.    2) Diabetic complications:  Eye: Does not have known diabetic retinopathy.  Neuro/ Feet: Does not have known diabetic peripheral neuropathy .  Renal: Patient does not have known baseline CKD. He   is not on an ACEI/ARB at present.       F/U in 6 months     I spent 25 minutes preparing to see the patient by review of recent labs, imaging and procedures, obtaining and reviewing separately obtained history, communicating with the patient, ordering medications, tests or procedures, and documenting clinical information in the EHR including the differential Dx, treatment, and any further evaluation and other management    Signed electronically by: Lyndle Herrlich, MD  [facility]  [facility] [address] [place] [place], [place] [address] Phone: [phone] FAX: [phone]   CC: Tresa Garter, MD [address] [place] [address] Phone: [phone]  Fax: [phone]  Return to Endocrinology clinic as below: Future Appointments  Date Time Provider Department Center  03/24/2023  7:50 AM Plotnikov, Georgina Quint, MD [id number]  None

## 2023-02-25 ENCOUNTER — Ambulatory Visit: Payer: Managed Care, Other (non HMO) | Admitting: Internal Medicine

## 2023-02-25 ENCOUNTER — Encounter: Payer: Self-pay | Admitting: Internal Medicine

## 2023-02-25 VITALS — BP 120/82 | HR 65 | Ht 69.0 in | Wt 239.0 lb

## 2023-02-25 DIAGNOSIS — E1165 Type 2 diabetes mellitus with hyperglycemia: Secondary | ICD-10-CM | POA: Diagnosis not present

## 2023-02-25 DIAGNOSIS — E119 Type 2 diabetes mellitus without complications: Secondary | ICD-10-CM | POA: Insufficient documentation

## 2023-02-25 LAB — POCT GLYCOSYLATED HEMOGLOBIN (HGB A1C): Hemoglobin A1C: 6.8 % — AB (ref 4.0–5.6)

## 2023-02-25 MED ORDER — EMPAGLIFLOZIN 25 MG PO TABS: 25.0000 mg | ORAL_TABLET | Freq: Every day | ORAL | 3 refills | Status: DC

## 2023-02-25 MED ORDER — GLIPIZIDE 5 MG PO TABS: 5.0000 mg | ORAL_TABLET | Freq: Two times a day (BID) | ORAL | 3 refills | Status: DC

## 2023-02-25 NOTE — Patient Instructions (Addendum)
-   Increase Jardiance 25 mg, 1 tablet every morning  - Continue Pioglitazone  45  mg, 1 tablet before breakfast  - Decrease Glipizide 5 mg, to 1 tablet before Breakfast and 1 tablets before Supper - Continue Metformin to 500 mg XR, 2 tablets with Breakfast and Supper  - Continue Rybelsus 14  mg , 1 tablet before  Breakfast    HOW TO TREAT LOW BLOOD SUGARS (Blood sugar LESS THAN 70 MG/DL) Please follow the RULE OF 15 for the treatment of hypoglycemia treatment (when your (blood sugars are less than 70 mg/dL)   STEP 1: Take 15 grams of carbohydrates when your blood sugar is low, which includes:  3-4 GLUCOSE TABS  OR 3-4 OZ OF JUICE OR REGULAR SODA OR ONE TUBE OF GLUCOSE GEL    STEP 2: RECHECK blood sugar in 15 MINUTES STEP 3: If your blood sugar is still low at the 15 minute recheck --> then, go back to STEP 1 and treat AGAIN with another 15 grams of carbohydrates.

## 2023-03-18 ENCOUNTER — Ambulatory Visit: Payer: Managed Care, Other (non HMO) | Admitting: Internal Medicine

## 2023-03-18 ENCOUNTER — Encounter: Payer: Self-pay | Admitting: Internal Medicine

## 2023-03-18 VITALS — BP 122/78 | HR 66 | Temp 97.7°F | Ht 69.0 in | Wt 236.4 lb

## 2023-03-18 DIAGNOSIS — E1165 Type 2 diabetes mellitus with hyperglycemia: Secondary | ICD-10-CM

## 2023-03-18 DIAGNOSIS — E119 Type 2 diabetes mellitus without complications: Secondary | ICD-10-CM | POA: Diagnosis not present

## 2023-03-18 DIAGNOSIS — E785 Hyperlipidemia, unspecified: Secondary | ICD-10-CM | POA: Diagnosis not present

## 2023-03-18 DIAGNOSIS — Z125 Encounter for screening for malignant neoplasm of prostate: Secondary | ICD-10-CM | POA: Diagnosis not present

## 2023-03-18 DIAGNOSIS — N529 Male erectile dysfunction, unspecified: Secondary | ICD-10-CM

## 2023-03-18 DIAGNOSIS — I10 Essential (primary) hypertension: Secondary | ICD-10-CM

## 2023-03-18 DIAGNOSIS — Z0000 Encounter for general adult medical examination without abnormal findings: Secondary | ICD-10-CM

## 2023-03-18 DIAGNOSIS — E118 Type 2 diabetes mellitus with unspecified complications: Secondary | ICD-10-CM

## 2023-03-18 LAB — CBC WITH DIFFERENTIAL/PLATELET
Basophils Absolute: 0 10*3/uL (ref 0.0–0.1)
Basophils Relative: 0.4 % (ref 0.0–3.0)
Eosinophils Absolute: 0.1 10*3/uL (ref 0.0–0.7)
Eosinophils Relative: 1.6 % (ref 0.0–5.0)
HCT: 51.2 % (ref 39.0–52.0)
Hemoglobin: 16.6 g/dL (ref 13.0–17.0)
Lymphocytes Relative: 22.8 % (ref 12.0–46.0)
Lymphs Abs: 1.6 10*3/uL (ref 0.7–4.0)
MCHC: 32.4 g/dL (ref 30.0–36.0)
MCV: 89.3 fl (ref 78.0–100.0)
Monocytes Absolute: 0.4 10*3/uL (ref 0.1–1.0)
Monocytes Relative: 6.1 % (ref 3.0–12.0)
Neutro Abs: 4.8 10*3/uL (ref 1.4–7.7)
Neutrophils Relative %: 69.1 % (ref 43.0–77.0)
Platelets: 190 10*3/uL (ref 150.0–400.0)
RBC: 5.74 Mil/uL (ref 4.22–5.81)
RDW: 14.9 % (ref 11.5–15.5)
WBC: 6.9 10*3/uL (ref 4.0–10.5)

## 2023-03-18 LAB — LIPID PANEL
Cholesterol: 203 mg/dL — ABNORMAL HIGH (ref 0–200)
HDL: 45.4 mg/dL (ref >39.00)
LDL Cholesterol: 128 mg/dL — ABNORMAL HIGH (ref 0–99)
NonHDL: 157.22
Total CHOL/HDL Ratio: 4
Triglycerides: 145 mg/dL (ref 0.0–149.0)
VLDL: 29 mg/dL (ref 0.0–40.0)

## 2023-03-18 LAB — COMPREHENSIVE METABOLIC PANEL
ALT: 13 U/L (ref 0–53)
AST: 18 U/L (ref 0–37)
Albumin: 4.8 g/dL (ref 3.5–5.2)
Alkaline Phosphatase: 80 U/L (ref 39–117)
BUN: 13 mg/dL (ref 6–23)
CO2: 29 mEq/L (ref 19–32)
Calcium: 9.6 mg/dL (ref 8.4–10.5)
Chloride: 105 mEq/L (ref 96–112)
Creatinine, Ser: 0.86 mg/dL (ref 0.40–1.50)
GFR: 91.89 mL/min (ref >60.00)
Glucose, Bld: 136 mg/dL — ABNORMAL HIGH (ref 70–99)
Potassium: 4.7 mEq/L (ref 3.5–5.1)
Sodium: 142 mEq/L (ref 135–145)
Total Bilirubin: 1.1 mg/dL (ref 0.2–1.2)
Total Protein: 7.5 g/dL (ref 6.0–8.3)

## 2023-03-18 LAB — URINALYSIS
Bilirubin Urine: NEGATIVE
Hgb urine dipstick: NEGATIVE
Ketones, ur: NEGATIVE
Leukocytes,Ua: NEGATIVE
Nitrite: NEGATIVE
Specific Gravity, Urine: 1.025 (ref 1.000–1.030)
Total Protein, Urine: NEGATIVE
Urine Glucose: >=1000 — AB
Urobilinogen, UA: 0.2 (ref 0.0–1.0)
pH: 6 (ref 5.0–8.0)

## 2023-03-18 LAB — MICROALBUMIN / CREATININE URINE RATIO
Creatinine,U: 106.1 mg/dL
Microalb Creat Ratio: 0.7 mg/g (ref 0.0–30.0)
Microalb, Ur: <0.7 mg/dL (ref 0.0–1.9)

## 2023-03-18 LAB — HEMOGLOBIN A1C: Hgb A1c MFr Bld: 6.8 % — ABNORMAL HIGH (ref 4.6–6.5)

## 2023-03-18 LAB — TSH: TSH: 1.02 u[IU]/mL (ref 0.35–5.50)

## 2023-03-18 LAB — PSA: PSA: 0.91 ng/mL (ref 0.10–4.00)

## 2023-03-18 MED ORDER — VARDENAFIL HCL 20 MG PO TABS: 20.0000 mg | ORAL_TABLET | Freq: Every day | ORAL | 6 refills | Status: AC | PRN

## 2023-03-18 NOTE — Assessment & Plan Note (Signed)
Chronic  On Bisoprolol HCT ED: Hold BP Rx x 1-2 d prn

## 2023-03-18 NOTE — Assessment & Plan Note (Signed)
We can try Levitra.

## 2023-03-18 NOTE — Assessment & Plan Note (Signed)
2023 CT: Total Agatston score: 10.2

## 2023-03-18 NOTE — Assessment & Plan Note (Signed)
2023 CT: Total Agatston score: 10.2  On Lipitor

## 2023-03-18 NOTE — Assessment & Plan Note (Addendum)
  We discussed age appropriate health related issues, including available/recomended screening tests and vaccinations. Labs were ordered to be later reviewed . All questions were answered. We discussed one or more of the following - seat belt use, use of sunscreen/sun exposure exercise,  fall risk reduction, second hand smoke exposure, firearm use and storage, seat belt use, a need for adhering to healthy diet and exercise. Labs were ordered.  All questions were answered. Shingrix advised 2023 CT: Total Agatston score: 10.2  Colon due 2022 Dr Christella Hartigan.  Pt had it at [facility] in 12/2021 due to insurance - 1 polyp. Due in 2031

## 2023-03-18 NOTE — Assessment & Plan Note (Signed)
Dr Lonzo Cloud On Rybelsus, Metformin, Jardiance, Glucotrol, Acos

## 2023-03-18 NOTE — Progress Notes (Signed)
Subjective:  Patient ID: Gary Fowler, male    DOB: [birth year]  Age: 62 y.o. MRN: [id number]  CC: No chief complaint on file.   HPI Gary Fowler presents for a well exam  Outpatient Medications Prior to Visit  Medication Sig Dispense Refill  . ACCU-CHEK GUIDE test strip USE AS DIRECTED 100 each 3  . aspirin 81 MG chewable tablet Chew 81 mg by mouth daily.    Marland Kitchen atorvastatin (LIPITOR) 20 MG tablet TAKE 1 TABLET BY MOUTH ONCE DAILY AT  6PM 90 tablet 3  . bisoprolol-hydrochlorothiazide (ZIAC) 10-6.25 MG tablet TAKE 1 TABLET BY MOUTH ONCE DAILY ANNUAL  APPOINTMENT  DUE  IN  [place],  MUST  SEE  PROVIDER  FOR  FUTURE  REFILLS 90 tablet 0  . CALCIUM/MAGNESIUM/ZINC FORMULA PO Take by mouth.    . empagliflozin (JARDIANCE) 25 MG TABS tablet Take 1 tablet (25 mg total) by mouth daily before breakfast. 90 tablet 3  . fluorouracil (EFUDEX) 5 % cream APPLY A THIN LAYER ON THE SKIN TWICE DAILY TO AFFECTED AREA FOR 2 WEEKS  0  . glipiZIDE (GLUCOTROL) 5 MG tablet Take 1 tablet (5 mg total) by mouth 2 (two) times daily before a meal. 180 tablet 3  . ibuprofen (ADVIL) 600 MG tablet Take 1 tablet (600 mg total) by mouth 2 (two) times daily as needed for moderate pain. for pain 180 tablet 0  . meloxicam (MOBIC) 15 MG tablet Take 15 mg by mouth daily.    . metFORMIN (GLUCOPHAGE-XR) 500 MG 24 hr tablet Take 2 tablets (1,000 mg total) by mouth 2 (two) times daily. 360 tablet 3  . Multiple Vitamin (MULTIVITAMIN) tablet Take 1 tablet by mouth daily.    . pioglitazone (ACTOS) 45 MG tablet Take 1 tablet (45 mg total) by mouth daily. 90 tablet 3  . Semaglutide (RYBELSUS) 14 MG TABS Take 1 tablet (14 mg total) by mouth daily. 90 tablet 3  . sildenafil (VIAGRA) 100 MG tablet TAKE 1/2 TO 1 (ONE-HALF TO ONE) TABLET BY MOUTH ONCE DAILY AS NEEDED FOR ERECTILE DYSFUNCTION 12 tablet 0   No facility-administered medications prior to visit.    ROS: Review of Systems  Constitutional:  Negative for appetite  change, fatigue and unexpected weight change.  HENT:  Negative for congestion, nosebleeds, sneezing, sore throat and trouble swallowing.   Eyes:  Negative for itching and visual disturbance.  Respiratory:  Negative for cough.   Cardiovascular:  Negative for chest pain, palpitations and leg swelling.  Gastrointestinal:  Negative for abdominal distention, blood in stool, diarrhea and nausea.  Genitourinary:  Negative for frequency and hematuria.  Musculoskeletal:  Negative for back pain, gait problem, joint swelling and neck pain.  Skin:  Negative for rash.  Neurological:  Negative for dizziness, tremors, speech difficulty and weakness.  Psychiatric/Behavioral:  Negative for agitation, dysphoric mood and sleep disturbance. The patient is not nervous/anxious.     Objective:  BP 122/78 (BP Location: Left Arm, Patient Position: Sitting, Cuff Size: Normal)   Pulse 66   Temp 97.7 F (36.5 C) (Oral)   Ht 5\' 9"  (1.753 m)   Wt 236 lb 6.4 oz (107.2 kg)   SpO2 98%   BMI 34.91 kg/m   BP Readings from Last 3 Encounters:  03/18/23 122/78  02/25/23 120/82  01/26/23 131/74    Wt Readings from Last 3 Encounters:  03/18/23 236 lb 6.4 oz (107.2 kg)  02/25/23 239 lb (108.4 kg)  01/26/23 235 lb (106.6 kg)  Physical Exam Constitutional:      General: He is not in acute distress.    Appearance: He is well-developed. He is obese.     Comments: NAD  Eyes:     Conjunctiva/sclera: Conjunctivae normal.     Pupils: Pupils are equal, round, and reactive to light.  Neck:     Thyroid: No thyromegaly.     Vascular: No JVD.  Cardiovascular:     Rate and Rhythm: Normal rate and regular rhythm.     Heart sounds: Normal heart sounds. No murmur heard.    No friction rub. No gallop.  Pulmonary:     Effort: Pulmonary effort is normal. No respiratory distress.     Breath sounds: Normal breath sounds. No wheezing or rales.  Chest:     Chest wall: No tenderness.  Abdominal:     General: Bowel sounds  are normal. There is no distension.     Palpations: Abdomen is soft. There is no mass.     Tenderness: There is no abdominal tenderness. There is no guarding or rebound.  Musculoskeletal:        General: No tenderness. Normal range of motion.     Cervical back: Normal range of motion.  Lymphadenopathy:     Cervical: No cervical adenopathy.  Skin:    General: Skin is warm and dry.     Findings: No rash.  Neurological:     Mental Status: He is alert and oriented to person, place, and time.     Cranial Nerves: No cranial nerve deficit.     Motor: No abnormal muscle tone.     Coordination: Coordination normal.     Gait: Gait normal.     Deep Tendon Reflexes: Reflexes are normal and symmetric.  Psychiatric:        Behavior: Behavior normal.        Thought Content: Thought content normal.        Judgment: Judgment normal.    Lab Results  Component Value Date   WBC 6.8 03/26/2022   HGB 15.0 03/26/2022   HCT 44.4 03/26/2022   PLT 163.0 03/26/2022   GLUCOSE 127 (H) 09/28/2022   CHOL 137 09/28/2022   TRIG 128.0 09/28/2022   HDL 36.60 (L) 09/28/2022   LDLDIRECT 56.3 11/20/2012   LDLCALC 75 09/28/2022   ALT 12 09/28/2022   AST 17 09/28/2022   NA 141 09/28/2022   K 3.9 09/28/2022   CL 104 09/28/2022   CREATININE 0.88 09/28/2022   BUN 10 09/28/2022   CO2 29 09/28/2022   TSH 1.21 03/26/2022   PSA 0.55 03/26/2022   HGBA1C 6.8 (A) 02/25/2023   MICROALBUR 1.0 03/26/2022    No results found.  Assessment & Plan:   Problem List Items Addressed This Visit     Dyslipidemia    2023 CT: Total Agatston score: 10.2  On Lipitor       Essential hypertension    Chronic  On Bisoprolol HCT ED: Hold BP Rx x 1-2 d prn      Relevant Medications   vardenafil (LEVITRA) 20 MG tablet   ERECTILE DYSFUNCTION    We can try Levitra      Type II diabetes mellitus with manifestations (HCC)    2023 CT: Total Agatston score: 10.2       Relevant Orders   Hemoglobin A1c   Microalbumin  / creatinine urine ratio   Well adult exam - Primary     We discussed age appropriate health related issues,  including available/recomended screening tests and vaccinations. Labs were ordered to be later reviewed . All questions were answered. We discussed one or more of the following - seat belt use, use of sunscreen/sun exposure exercise,  fall risk reduction, second hand smoke exposure, firearm use and storage, seat belt use, a need for adhering to healthy diet and exercise. Labs were ordered.  All questions were answered. Shingrix advised 2023 CT: Total Agatston score: 10.2  Colon due 2022 Dr Christella Hartigan.  Pt had it at [facility] in 12/2021 due to insurance - 1 polyp. Due in 2031      Relevant Orders   TSH   Urinalysis   Lipid panel   CBC with Differential/Platelet   PSA   Comprehensive metabolic panel   Hemoglobin A1c   Microalbumin / creatinine urine ratio   Type 2 diabetes mellitus with hyperglycemia, without long-term current use of insulin (HCC)    Dr Lonzo Cloud On Rybelsus, Metformin, Jardiance, Glucotrol, Acos      Type 2 diabetes mellitus without complication, without long-term current use of insulin (HCC)    2023 CT: Total Agatston score: 10.2       Relevant Orders   Hemoglobin A1c      Meds ordered this encounter  Medications  . vardenafil (LEVITRA) 20 MG tablet    Sig: Take 1 tablet (20 mg total) by mouth daily as needed for erectile dysfunction.    Dispense:  20 tablet    Refill:  6      Follow-up: No follow-ups on file.  Sonda Primes, MD

## 2023-03-24 ENCOUNTER — Encounter: Payer: Managed Care, Other (non HMO) | Admitting: Internal Medicine

## 2023-04-03 ENCOUNTER — Encounter: Payer: Self-pay | Admitting: Internal Medicine

## 2023-05-20 ENCOUNTER — Other Ambulatory Visit: Payer: Self-pay | Admitting: Internal Medicine

## 2023-05-20 DIAGNOSIS — Z1211 Encounter for screening for malignant neoplasm of colon: Secondary | ICD-10-CM

## 2023-05-20 DIAGNOSIS — Z1212 Encounter for screening for malignant neoplasm of rectum: Secondary | ICD-10-CM

## 2023-06-15 ENCOUNTER — Other Ambulatory Visit: Payer: Self-pay | Admitting: Internal Medicine

## 2023-06-15 DIAGNOSIS — E1165 Type 2 diabetes mellitus with hyperglycemia: Secondary | ICD-10-CM

## 2023-08-30 ENCOUNTER — Encounter: Payer: Self-pay | Admitting: Internal Medicine

## 2023-08-30 ENCOUNTER — Ambulatory Visit (INDEPENDENT_AMBULATORY_CARE_PROVIDER_SITE_OTHER): Payer: Managed Care, Other (non HMO) | Admitting: Internal Medicine

## 2023-08-30 VITALS — BP 122/80 | HR 68 | Ht 69.0 in | Wt 242.0 lb

## 2023-08-30 DIAGNOSIS — Z7984 Long term (current) use of oral hypoglycemic drugs: Secondary | ICD-10-CM | POA: Diagnosis not present

## 2023-08-30 DIAGNOSIS — E119 Type 2 diabetes mellitus without complications: Secondary | ICD-10-CM

## 2023-08-30 LAB — POCT GLYCOSYLATED HEMOGLOBIN (HGB A1C): Hemoglobin A1C: 6.6 % — AB (ref 4.0–5.6)

## 2023-08-30 LAB — POCT GLUCOSE (DEVICE FOR HOME USE): Glucose Fasting, POC: 210 mg/dL — AB (ref 70–99)

## 2023-08-30 MED ORDER — GLIPIZIDE 5 MG PO TABS: 5.0000 mg | ORAL_TABLET | Freq: Two times a day (BID) | ORAL | 3 refills | Status: DC

## 2023-08-30 MED ORDER — EMPAGLIFLOZIN 25 MG PO TABS: 25.0000 mg | ORAL_TABLET | Freq: Every day | ORAL | 3 refills | Status: DC

## 2023-08-30 MED ORDER — ACCU-CHEK GUIDE TEST VI STRP: 1.0000 | ORAL_STRIP | Freq: Every day | 3 refills | Status: AC

## 2023-08-30 MED ORDER — RYBELSUS 14 MG PO TABS: 1.0000 | ORAL_TABLET | Freq: Every day | ORAL | 3 refills | Status: DC

## 2023-08-30 MED ORDER — PIOGLITAZONE HCL 45 MG PO TABS: 45.0000 mg | ORAL_TABLET | Freq: Every day | ORAL | 3 refills | Status: DC

## 2023-08-30 MED ORDER — ACCU-CHEK GUIDE ME W/DEVICE KIT: 1.0000 | PACK | Freq: Every day | 0 refills | Status: AC

## 2023-08-30 MED ORDER — METFORMIN HCL ER 500 MG PO TB24: 1000.0000 mg | ORAL_TABLET | Freq: Two times a day (BID) | ORAL | 3 refills | Status: DC

## 2023-08-30 NOTE — Patient Instructions (Signed)
-   Continue  Jardiance  25 mg, 1 tablet every morning  - Continue Pioglitazone   45  mg, 1 tablet before breakfast  - Continue Glipizide  5 mg, to 1 tablet before Breakfast and 1 tablets before Supper - Continue Metformin  to 500 mg XR, 2 tablets with Breakfast and Supper  - Continue Rybelsus  14  mg , 1 tablet before  Breakfast    HOW TO TREAT LOW BLOOD SUGARS (Blood sugar LESS THAN 70 MG/DL) Please follow the RULE OF 15 for the treatment of hypoglycemia treatment (when your (blood sugars are less than 70 mg/dL)   STEP 1: Take 15 grams of carbohydrates when your blood sugar is low, which includes:  3-4 GLUCOSE TABS  OR 3-4 OZ OF JUICE OR REGULAR SODA OR ONE TUBE OF GLUCOSE GEL    STEP 2: RECHECK blood sugar in 15 MINUTES STEP 3: If your blood sugar is still low at the 15 minute recheck --> then, go back to STEP 1 and treat AGAIN with another 15 grams of carbohydrates.

## 2023-08-30 NOTE — Progress Notes (Signed)
 Name: Gary Fowler  Age/ Sex: 63 y.o., male   MRN/ DOB: [id number], 1961/05/06     PCP: Garald Karlynn GAILS, MD   Reason for Endocrinology Evaluation: Type 2 Diabetes Mellitus  Initial Endocrine Consultative Visit: 12/07/2019    PATIENT IDENTIFIER: Gary Fowler is a 63 y.o. male with a past medical history of HTN, T2DM, and dyslipidemia. The patient has followed with Endocrinology clinic since 12/07/2019 for consultative assistance with management of his diabetes.  DIABETIC HISTORY:  Gary Fowler was diagnosed with T2DM in 2008, . His hemoglobin A1c has ranged from 6.3% in 2016, peaking at 8.5% in 2021.  On his initial visit to our clinic his A1c was 8.5%. He was on Metformin , Glimepiride  and Oseni .We switched Glimepiride  to Glipizide  and continued metformin  and Oseni .   By 02/2020 we switched Oseni  to Pioglitazone  and Rybelsus    Testosterone checked due to complaints of ED 06/2020 with normal testosterone at 367 ng/dL    Jardiance  started 08/2022  SUBJECTIVE:   During the last visit (02/25/2023): A1c 6.8%.    Today (08/30/2023): Gary Fowler is here for a follow up on diabetes management.  He has not been checking glucose in 2 months due to malfunction . The patient has not had hypoglycemic episodes since the last clinic visit.  Has a left LE fracture due to mechanical fall at work in 05/2023  He denies nausea or vomiting Denies Constipation but has loose bowels  Denies LE edema unless from fracture    Daughter and family moved in with them    HOME DIABETES REGIMEN:  Metformin  500 mg XR, 2 tabs  BID  Glipizide  5 mg , 1 tabs BID Pioglitazone  45 mg, 1 tablet daily  Rybelsus  14 mg daily Jardiance  25 mg daily     Statin: yes ACE-I/ARB: no    METER DOWNLOAD SUMMARY:    DIABETIC COMPLICATIONS: Microvascular complications:    Denies: CKD, retinopapthy , neuropathy  Last eye exam: Completed  2022   Macrovascular complications:    Denies: CAD,  PVD, CVA    HISTORY:  Past Medical History:  Past Medical History:  Diagnosis Date   ED (erectile dysfunction)    Family history of prostate cancer    Hyperlipidemia    Hypertension    Insomnia    LBP (low back pain)    Mood swings    OSA (obstructive sleep apnea)    Skin cancer of nose 2008   Left   Type II or unspecified type diabetes mellitus without mention of complication, not stated as uncontrolled    Past Surgical History:  Past Surgical History:  Procedure Laterality Date   arm fracture     with plate   ARTHROSCOPY KNEE W/ DRILLING     rt. knee   bone spur removal     lt. shoulder   LUMBAR FUSION     LUMBAR LAMINECTOMY     SKIN CANCER EXCISION     face   Social History:  reports that he has quit smoking. His smokeless tobacco use includes chew. He reports that he does not drink alcohol and does not use drugs. Family History:  Family History  Problem Relation Age of Onset   Cancer Father        Melanoma   Hypertension Mother    Crohn's disease Paternal Uncle    Diabetes Maternal Grandfather    Diabetes Paternal Grandfather      HOME MEDICATIONS: Allergies as of 08/30/2023  Reactions   Morphine Sulfate    REACTION: headache        Medication List        Accurate as of August 30, 2023  7:46 AM. If you have any questions, ask your nurse or doctor.          Accu-Chek Guide test strip Generic drug: glucose blood USE AS DIRECTED   aspirin 81 MG chewable tablet Chew 81 mg by mouth daily.   atorvastatin  20 MG tablet Commonly known as: LIPITOR TAKE 1 TABLET BY MOUTH ONCE DAILY AT  6PM   bisoprolol -hydrochlorothiazide  10-6.25 MG tablet Commonly known as: ZIAC  TAKE 1 TABLET BY MOUTH ONCE DAILY ANNUAL  APPOINTMENT  DUE  IN  [place],  MUST  SEE  PROVIDER  FOR  FUTURE  REFILLS   CALCIUM /MAGNESIUM/ZINC FORMULA PO Take by mouth.   diclofenac Sodium 1 % Gel Commonly known as: VOLTAREN Apply 2 g topically 4 (four) times daily.    empagliflozin  25 MG Tabs tablet Commonly known as: Jardiance  Take 1 tablet (25 mg total) by mouth daily before breakfast.   fluorouracil 5 % cream Commonly known as: EFUDEX APPLY A THIN LAYER ON THE SKIN TWICE DAILY TO AFFECTED AREA FOR 2 WEEKS   glipiZIDE  5 MG tablet Commonly known as: GLUCOTROL  Take 1 tablet (5 mg total) by mouth 2 (two) times daily before a meal.   HYDROcodone -acetaminophen  5-325 MG tablet Commonly known as: NORCO/VICODIN Take by mouth.   ibuprofen  600 MG tablet Commonly known as: ADVIL  Take 1 tablet (600 mg total) by mouth 2 (two) times daily as needed for moderate pain. for pain   meloxicam  15 MG tablet Commonly known as: MOBIC  Take 15 mg by mouth daily.   metFORMIN  500 MG 24 hr tablet Commonly known as: GLUCOPHAGE -XR Take 2 tablets (1,000 mg total) by mouth 2 (two) times daily.   multivitamin tablet Take 1 tablet by mouth daily.   mupirocin ointment 2 % Commonly known as: BACTROBAN Apply topically 3 (three) times daily.   pioglitazone  45 MG tablet Commonly known as: Actos  Take 1 tablet (45 mg total) by mouth daily.   Rybelsus  14 MG Tabs Generic drug: Semaglutide  Take 1 tablet (14 mg total) by mouth daily.   vardenafil  20 MG tablet Commonly known as: Levitra  Take 1 tablet (20 mg total) by mouth daily as needed for erectile dysfunction.         OBJECTIVE:   Vital Signs: BP 122/80 (BP Location: Left Arm, Patient Position: Sitting, Cuff Size: Normal)   Pulse 68   Ht 5' 9 (1.753 m)   Wt 242 lb (109.8 kg)   SpO2 98%   BMI 35.74 kg/m   Wt Readings from Last 3 Encounters:  08/30/23 242 lb (109.8 kg)  03/18/23 236 lb 6.4 oz (107.2 kg)  02/25/23 239 lb (108.4 kg)     Exam: General: Pt appears well and is in NAD  Lungs: Clear with good BS bilat   Heart: RRR with normal   Extremities: No pretibial edema.  Neuro: MS is good with appropriate affect, pt is alert and Ox3    DM foot exam:  02/25/2023   The skin of the feet is  intact without sores or ulcerations. The pedal pulses are +1 on right and 1+ on left. The sensation is intact to a screening 5.07, 10 gram monofilament bilaterally     DATA REVIEWED:  Lab Results  Component Value Date   HGBA1C 6.6 (A) 08/30/2023   HGBA1C 6.8 (H) 03/18/2023  HGBA1C 6.8 (A) 02/25/2023    Latest Reference Range & Units 03/18/23 09:45  Sodium 135 - 145 mEq/L 142  Potassium 3.5 - 5.1 mEq/L 4.7  Chloride 96 - 112 mEq/L 105  CO2 19 - 32 mEq/L 29  Glucose 70 - 99 mg/dL 863 (H)  BUN 6 - 23 mg/dL 13  Creatinine 9.59 - 8.49 mg/dL 9.13  Calcium  8.4 - 10.5 mg/dL 9.6  Alkaline Phosphatase 39 - 117 U/L 80  Albumin 3.5 - 5.2 g/dL 4.8  AST 0 - 37 U/L 18  ALT 0 - 53 U/L 13  Total Protein 6.0 - 8.3 g/dL 7.5  Total Bilirubin 0.2 - 1.2 mg/dL 1.1  GFR >39.99 mL/min 91.89  Total CHOL/HDL Ratio  4  Cholesterol 0 - 200 mg/dL 796 (H)  HDL Cholesterol >39.00 mg/dL 54.59  LDL (calc) 0 - 99 mg/dL 871 (H)  MICROALB/CREAT RATIO 0.0 - 30.0 mg/g 0.7  NonHDL  157.22  Triglycerides 0.0 - 149.0 mg/dL 854.9  VLDL 0.0 - 59.9 mg/dL 70.9    Latest Reference Range & Units 03/18/23 09:45  Creatinine,U mg/dL 893.8  Microalb, Ur 0.0 - 1.9 mg/dL <9.2  MICROALB/CREAT RATIO 0.0 - 30.0 mg/g 0.7     ASSESSMENT / PLAN / RECOMMENDATIONS:   1) Type 2 Diabetes Mellitus,   Optimally Controlled , Without  complications - Most recent A1c of 6.6 %. Goal A1c < 7.0 %.    - A1c remains at goal -His diarrhea resolved with switching metformin  to XR formulation -His in office BG was elevated to 10 Mg/DL, the patient did drink milk right before coming here -A prescription for a new Accu-Chek meter and extra strips were sent to the pharmacy, he was encouraged to check glucose, to notify our office if fasting BG are persistently >150 mg/dL  -No changes at this time   MEDICATIONS:  -Continue Jardiance  25 mg daily  -Continue  Pioglitazone  45 mg, 1 tablet daily  -Continue Rybelsus  14 mg , 1 tablet with  Breakfast  -Continue glipizide  5 mg, 1 tablet before breakfast and 1 tablet before supper  -Continue metformin  500 mg  XR , 2 tablet twice daily    EDUCATION / INSTRUCTIONS: BG monitoring instructions: Patient is instructed to check his blood sugars 1 times a day, before breakfast Call [facility] if: BG persistently < 70 I reviewed the Rule of 15 for the treatment of hypoglycemia in detail with the patient. Literature supplied.    2) Diabetic complications:  Eye: Does not have known diabetic retinopathy.  Neuro/ Feet: Does not have known diabetic peripheral neuropathy .  Renal: Patient does not have known baseline CKD. He   is not on an ACEI/ARB at present.       F/U in 4 months      Signed electronically by: Stefano Redgie Butts, MD  [facility]  [facility] [address] [place], [place] [address] Phone: [phone] FAX: [phone]   CC: Garald Karlynn GAILS, MD [address] [place] [address] Phone: [phone]  Fax: [phone]  Return to Endocrinology clinic as below: Future Appointments  Date Time Provider Department Center  09/21/2023  7:50 AM Plotnikov, Karlynn GAILS, MD [id number] None

## 2023-09-21 ENCOUNTER — Ambulatory Visit: Payer: Managed Care, Other (non HMO) | Admitting: Internal Medicine

## 2023-09-21 ENCOUNTER — Encounter: Payer: Self-pay | Admitting: Internal Medicine

## 2023-09-21 VITALS — BP 118/78 | HR 63 | Temp 98.0°F | Ht 69.0 in | Wt 242.0 lb

## 2023-09-21 DIAGNOSIS — G8929 Other chronic pain: Secondary | ICD-10-CM

## 2023-09-21 DIAGNOSIS — Z0000 Encounter for general adult medical examination without abnormal findings: Secondary | ICD-10-CM

## 2023-09-21 DIAGNOSIS — M25562 Pain in left knee: Secondary | ICD-10-CM | POA: Diagnosis not present

## 2023-09-21 DIAGNOSIS — M5440 Lumbago with sciatica, unspecified side: Secondary | ICD-10-CM | POA: Diagnosis not present

## 2023-09-21 DIAGNOSIS — K5730 Diverticulosis of large intestine without perforation or abscess without bleeding: Secondary | ICD-10-CM | POA: Insufficient documentation

## 2023-09-21 DIAGNOSIS — I10 Essential (primary) hypertension: Secondary | ICD-10-CM

## 2023-09-21 DIAGNOSIS — E785 Hyperlipidemia, unspecified: Secondary | ICD-10-CM | POA: Diagnosis not present

## 2023-09-21 NOTE — Progress Notes (Signed)
 Subjective:  Patient ID: Gary Fowler, male    DOB: 01-20-1961  Age: 63 y.o. MRN: [id number]  CC: Medical Management of Chronic Issues (6 MNTH F/U)   HPI Gary Fowler presents for a 6 mo f/u: DM, HTN, dyslipidemia He had a R tibia fx in Oct 2024 at work  Outpatient Medications Prior to Visit  Medication Sig Dispense Refill   aspirin 81 MG chewable tablet Chew 81 mg by mouth daily.     atorvastatin  (LIPITOR) 20 MG tablet TAKE 1 TABLET BY MOUTH ONCE DAILY AT  6PM 90 tablet 3   bisoprolol -hydrochlorothiazide  (ZIAC ) 10-6.25 MG tablet TAKE 1 TABLET BY MOUTH ONCE DAILY ANNUAL  APPOINTMENT  DUE  IN  [place],  MUST  SEE  PROVIDER  FOR  FUTURE  REFILLS 90 tablet 0   Blood Glucose Monitoring Suppl (ACCU-CHEK GUIDE ME) w/Device KIT 1 Device by Does not apply route daily in the afternoon. 1 kit 0   CALCIUM /MAGNESIUM/ZINC FORMULA PO Take by mouth.     diclofenac Sodium (VOLTAREN) 1 % GEL Apply 2 g topically 4 (four) times daily.     empagliflozin  (JARDIANCE ) 25 MG TABS tablet Take 1 tablet (25 mg total) by mouth daily before breakfast. 90 tablet 3   fluorouracil (EFUDEX) 5 % cream APPLY A THIN LAYER ON THE SKIN TWICE DAILY TO AFFECTED AREA FOR 2 WEEKS  0   glipiZIDE  (GLUCOTROL ) 5 MG tablet Take 1 tablet (5 mg total) by mouth 2 (two) times daily before a meal. 180 tablet 3   glucose blood (ACCU-CHEK GUIDE TEST) test strip 1 each by Other route daily in the afternoon. Use as instructed 100 each 3   HYDROcodone -acetaminophen  (NORCO/VICODIN) 5-325 MG tablet Take by mouth.     ibuprofen  (ADVIL ) 600 MG tablet Take 1 tablet (600 mg total) by mouth 2 (two) times daily as needed for moderate pain. for pain 180 tablet 0   meloxicam  (MOBIC ) 15 MG tablet Take 15 mg by mouth daily.     metFORMIN  (GLUCOPHAGE -XR) 500 MG 24 hr tablet Take 2 tablets (1,000 mg total) by mouth 2 (two) times daily. 360 tablet 3   Multiple Vitamin (MULTIVITAMIN) tablet Take 1 tablet by mouth daily.     mupirocin ointment  (BACTROBAN) 2 % Apply topically 3 (three) times daily.     pioglitazone  (ACTOS ) 45 MG tablet Take 1 tablet (45 mg total) by mouth daily. 90 tablet 3   Semaglutide  (RYBELSUS ) 14 MG TABS Take 1 tablet (14 mg total) by mouth daily. 90 tablet 3   vardenafil  (LEVITRA ) 20 MG tablet Take 1 tablet (20 mg total) by mouth daily as needed for erectile dysfunction. 20 tablet 6   No facility-administered medications prior to visit.    ROS: Review of Systems  Constitutional:  Positive for unexpected weight change. Negative for appetite change and fatigue.  HENT:  Negative for congestion, nosebleeds, sneezing, sore throat and trouble swallowing.   Eyes:  Negative for itching and visual disturbance.  Respiratory:  Negative for cough.   Cardiovascular:  Negative for chest pain, palpitations and leg swelling.  Gastrointestinal:  Negative for abdominal distention, blood in stool, diarrhea and nausea.  Genitourinary:  Negative for frequency and hematuria.  Musculoskeletal:  Positive for arthralgias and gait problem. Negative for back pain, joint swelling and neck pain.  Neurological:  Negative for dizziness, tremors, speech difficulty and weakness.  Psychiatric/Behavioral:  Negative for agitation, dysphoric mood and sleep disturbance. The patient is not nervous/anxious.  Objective:  BP 118/78 (BP Location: Left Arm, Patient Position: Sitting, Cuff Size: Normal)   Pulse 63   Temp 98 F (36.7 C) (Oral)   Ht 5' 9 (1.753 m)   Wt 242 lb (109.8 kg)   SpO2 97%   BMI 35.74 kg/m   BP Readings from Last 3 Encounters:  09/21/23 118/78  08/30/23 122/80  03/18/23 122/78    Wt Readings from Last 3 Encounters:  09/21/23 242 lb (109.8 kg)  08/30/23 242 lb (109.8 kg)  03/18/23 236 lb 6.4 oz (107.2 kg)    Physical Exam Constitutional:      General: He is not in acute distress.    Appearance: He is well-developed. He is obese.     Comments: NAD  Eyes:     Conjunctiva/sclera: Conjunctivae normal.      Pupils: Pupils are equal, round, and reactive to light.  Neck:     Thyroid : No thyromegaly.     Vascular: No JVD.  Cardiovascular:     Rate and Rhythm: Normal rate and regular rhythm.     Heart sounds: Normal heart sounds. No murmur heard.    No friction rub. No gallop.  Pulmonary:     Effort: Pulmonary effort is normal. No respiratory distress.     Breath sounds: Normal breath sounds. No wheezing or rales.  Chest:     Chest wall: No tenderness.  Abdominal:     General: Bowel sounds are normal. There is no distension.     Palpations: Abdomen is soft. There is no mass.     Tenderness: There is no abdominal tenderness. There is no guarding or rebound.  Musculoskeletal:        General: Tenderness present. Normal range of motion.     Cervical back: Normal range of motion.  Lymphadenopathy:     Cervical: No cervical adenopathy.  Skin:    General: Skin is warm and dry.     Findings: No rash.  Neurological:     Mental Status: He is alert and oriented to person, place, and time.     Cranial Nerves: No cranial nerve deficit.     Motor: No abnormal muscle tone.     Coordination: Coordination normal.     Gait: Gait normal.     Deep Tendon Reflexes: Reflexes are normal and symmetric.  Psychiatric:        Behavior: Behavior normal.        Thought Content: Thought content normal.        Judgment: Judgment normal.   Pain in the L knee  Lab Results  Component Value Date   WBC 6.9 03/18/2023   HGB 16.6 03/18/2023   HCT 51.2 03/18/2023   PLT 190.0 03/18/2023   GLUCOSE 136 (H) 03/18/2023   CHOL 203 (H) 03/18/2023   TRIG 145.0 03/18/2023   HDL 45.40 03/18/2023   LDLDIRECT 56.3 11/20/2012   LDLCALC 128 (H) 03/18/2023   ALT 13 03/18/2023   AST 18 03/18/2023   NA 142 03/18/2023   K 4.7 03/18/2023   CL 105 03/18/2023   CREATININE 0.86 03/18/2023   BUN 13 03/18/2023   CO2 29 03/18/2023   TSH 1.02 03/18/2023   PSA 0.91 03/18/2023   HGBA1C 6.6 (A) 08/30/2023   MICROALBUR <0.7  03/18/2023    No results found.  Assessment & Plan:   Problem List Items Addressed This Visit     Dyslipidemia   2023 CT: Total Agatston score: 10.2  On Lipitor  Essential hypertension - Primary   Chronic On Bisoprolol  HCT       Relevant Orders   Microalbumin / creatinine urine ratio   LOW BACK PAIN   H/o remote back surgery      Well adult exam   Relevant Orders   TSH   Urinalysis   CBC with Differential/Platelet   Lipid panel   PSA   Comprehensive metabolic panel   Microalbumin / creatinine urine ratio   Knee pain, chronic   He had a R tibia fx in Oct 2024 at work In PT Tylenol  prn         No orders of the defined types were placed in this encounter.     Follow-up: Return in about 6 months (around 03/20/2024) for Wellness Exam.  Marolyn Noel, MD

## 2023-09-21 NOTE — Assessment & Plan Note (Signed)
 H/o remote back surgery

## 2023-09-21 NOTE — Assessment & Plan Note (Signed)
He had a R tibia fx in Oct 2024 at work In PT Tylenol prn

## 2023-09-21 NOTE — Assessment & Plan Note (Signed)
 Chronic On Bisoprolol  HCT

## 2023-09-21 NOTE — Assessment & Plan Note (Signed)
2023 CT: Total Agatston score: 10.2  On Lipitor

## 2023-09-25 ENCOUNTER — Other Ambulatory Visit: Payer: Self-pay | Admitting: Internal Medicine

## 2024-01-02 ENCOUNTER — Other Ambulatory Visit: Payer: Self-pay | Admitting: Internal Medicine

## 2024-01-12 ENCOUNTER — Ambulatory Visit (INDEPENDENT_AMBULATORY_CARE_PROVIDER_SITE_OTHER): Payer: Managed Care, Other (non HMO) | Admitting: Internal Medicine

## 2024-01-12 ENCOUNTER — Encounter: Payer: Self-pay | Admitting: Internal Medicine

## 2024-01-12 ENCOUNTER — Other Ambulatory Visit (HOSPITAL_COMMUNITY): Payer: Self-pay

## 2024-01-12 VITALS — BP 134/80 | HR 67 | Ht 69.0 in | Wt 239.0 lb

## 2024-01-12 DIAGNOSIS — E785 Hyperlipidemia, unspecified: Secondary | ICD-10-CM | POA: Diagnosis not present

## 2024-01-12 DIAGNOSIS — Z7984 Long term (current) use of oral hypoglycemic drugs: Secondary | ICD-10-CM

## 2024-01-12 DIAGNOSIS — E119 Type 2 diabetes mellitus without complications: Secondary | ICD-10-CM | POA: Diagnosis not present

## 2024-01-12 LAB — POCT GLYCOSYLATED HEMOGLOBIN (HGB A1C): Hemoglobin A1C: 6.9 % — AB (ref 4.0–5.6)

## 2024-01-12 LAB — POCT GLUCOSE (DEVICE FOR HOME USE): Glucose Fasting, POC: 151 mg/dL — AB (ref 70–99)

## 2024-01-12 MED ORDER — PIOGLITAZONE HCL 45 MG PO TABS: 45.0000 mg | ORAL_TABLET | Freq: Every day | ORAL | 3 refills | Status: DC

## 2024-01-12 MED ORDER — GLIPIZIDE 5 MG PO TABS: 5.0000 mg | ORAL_TABLET | Freq: Two times a day (BID) | ORAL | 3 refills | Status: DC

## 2024-01-12 MED ORDER — METFORMIN HCL ER 500 MG PO TB24: 1000.0000 mg | ORAL_TABLET | Freq: Two times a day (BID) | ORAL | 3 refills | Status: DC

## 2024-01-12 MED ORDER — RYBELSUS 14 MG PO TABS: 1.0000 | ORAL_TABLET | Freq: Every day | ORAL | 3 refills | Status: DC

## 2024-01-12 MED ORDER — EMPAGLIFLOZIN 25 MG PO TABS: 25.0000 mg | ORAL_TABLET | Freq: Every day | ORAL | 3 refills | Status: DC

## 2024-01-12 NOTE — Progress Notes (Signed)
 Name: Gary Fowler  Age/ Sex: 63 y.o., male   MRN/ DOB: [id number], 1961-01-09     PCP: Genia Kettering, MD   Reason for Endocrinology Evaluation: Type 2 Diabetes Mellitus  Initial Endocrine Consultative Visit: 12/07/2019    PATIENT IDENTIFIER: Gary Fowler is a 63 y.o. male with a past medical history of HTN, T2DM, and dyslipidemia. The patient has followed with Endocrinology clinic since 12/07/2019 for consultative assistance with management of his diabetes.  DIABETIC HISTORY:  Gary Fowler was diagnosed with T2DM in 2008, . His hemoglobin A1c has ranged from 6.3% in 2016, peaking at 8.5% in 2021.  On his initial visit to our clinic his A1c was 8.5%. He was on Metformin , Glimepiride  and Oseni .We switched Glimepiride  to Glipizide  and continued metformin  and Oseni .   By 02/2020 we switched Oseni  to Pioglitazone  and Rybelsus    Testosterone checked due to complaints of ED 06/2020 with normal testosterone at 367 ng/dL    Jardiance  started 08/2022  SUBJECTIVE:   During the last visit (08/30/2023): A1c 6.6%.    Today (01/12/2024): Gary Fowler is here for a follow up on diabetes management.  He has not been checking glucose 2x daily.  Weight fluctuates He denies nausea or vomiting He had loose stools to Jardiance  but developed difficulty with Rybelsus   Denies LE edema       HOME DIABETES REGIMEN:  Metformin  500 mg XR, 2 tabs  BID  Glipizide  5 mg , 1 tabs BID Pioglitazone  45 mg, 1 tablet daily  Rybelsus  14 mg daily Jardiance  25 mg daily     Statin: yes ACE-I/ARB: no    METER DOWNLOAD SUMMARY:  N/a  DIABETIC COMPLICATIONS: Microvascular complications:    Denies: CKD, retinopapthy , neuropathy  Last eye exam: Completed  2024   Macrovascular complications:    Denies: CAD, PVD, CVA    HISTORY:  Past Medical History:  Past Medical History:  Diagnosis Date   ED (erectile dysfunction)    Family history of prostate cancer    Hyperlipidemia     Hypertension    Insomnia    LBP (low back pain)    Mood swings    OSA (obstructive sleep apnea)    Skin cancer of nose 2008   Left   Type II or unspecified type diabetes mellitus without mention of complication, not stated as uncontrolled    Past Surgical History:  Past Surgical History:  Procedure Laterality Date   arm fracture     with plate   ARTHROSCOPY KNEE W/ DRILLING     rt. knee   bone spur removal     lt. shoulder   LUMBAR FUSION     LUMBAR LAMINECTOMY     SKIN CANCER EXCISION     face   Social History:  reports that he has quit smoking. His smokeless tobacco use includes chew. He reports that he does not drink alcohol and does not use drugs. Family History:  Family History  Problem Relation Age of Onset   Cancer Father        Melanoma   Hypertension Mother    Crohn's disease Paternal Uncle    Diabetes Maternal Grandfather    Diabetes Paternal Grandfather      HOME MEDICATIONS: Allergies as of 01/12/2024       Reactions   Morphine Sulfate    REACTION: headache        Medication List        Accurate as of Jan 12, 2024  7:52 AM. If you have any questions, ask your nurse or doctor.          Accu-Chek Guide Me w/Device Kit 1 Device by Does not apply route daily in the afternoon.   Accu-Chek Guide Test test strip Generic drug: glucose blood 1 each by Other route daily in the afternoon. Use as instructed   aspirin 81 MG chewable tablet Chew 81 mg by mouth daily.   atorvastatin  20 MG tablet Commonly known as: LIPITOR TAKE 1 TABLET BY MOUTH ONCE DAILY AT  6PM   bisoprolol -hydrochlorothiazide  10-6.25 MG tablet Commonly known as: ZIAC  TAKE 1 TABLET BY MOUTH ONCE DAILY . APPOINTMENT REQUIRED FOR FUTURE REFILLS   CALCIUM /MAGNESIUM/ZINC FORMULA PO Take by mouth.   cyclobenzaprine 10 MG tablet Commonly known as: FLEXERIL Take 10 mg by mouth daily as needed.   diclofenac Sodium 1 % Gel Commonly known as: VOLTAREN Apply 2 g topically 4  (four) times daily.   empagliflozin  25 MG Tabs tablet Commonly known as: Jardiance  Take 1 tablet (25 mg total) by mouth daily before breakfast.   fluorouracil 5 % cream Commonly known as: EFUDEX APPLY A THIN LAYER ON THE SKIN TWICE DAILY TO AFFECTED AREA FOR 2 WEEKS   glipiZIDE  5 MG tablet Commonly known as: GLUCOTROL  Take 1 tablet (5 mg total) by mouth 2 (two) times daily before a meal.   HYDROcodone -acetaminophen  5-325 MG tablet Commonly known as: NORCO/VICODIN Take by mouth.   ibuprofen  600 MG tablet Commonly known as: ADVIL  Take 1 tablet (600 mg total) by mouth 2 (two) times daily as needed for moderate pain. for pain   meloxicam  15 MG tablet Commonly known as: MOBIC  Take 15 mg by mouth daily.   metFORMIN  500 MG 24 hr tablet Commonly known as: GLUCOPHAGE -XR Take 2 tablets (1,000 mg total) by mouth 2 (two) times daily.   multivitamin tablet Take 1 tablet by mouth daily.   mupirocin ointment 2 % Commonly known as: BACTROBAN Apply topically 3 (three) times daily.   pioglitazone  45 MG tablet Commonly known as: Actos  Take 1 tablet (45 mg total) by mouth daily.   Rybelsus  14 MG Tabs Generic drug: Semaglutide  Take 1 tablet (14 mg total) by mouth daily.   vardenafil  20 MG tablet Commonly known as: Levitra  Take 1 tablet (20 mg total) by mouth daily as needed for erectile dysfunction.         OBJECTIVE:   Vital Signs: There were no vitals taken for this visit.  Wt Readings from Last 3 Encounters:  09/21/23 242 lb (109.8 kg)  08/30/23 242 lb (109.8 kg)  03/18/23 236 lb 6.4 oz (107.2 kg)     Exam: General: Pt appears well and is in NAD  Lungs: Clear with good BS bilat   Heart: RRR with normal   Extremities: No pretibial edema.  Neuro: MS is good with appropriate affect, pt is alert and Ox3    DM foot exam:  01/12/2024   The skin of the feet is intact without sores or ulcerations. The pedal pulses are +1 on right and 1+ on left. The sensation is  intact to a screening 5.07, 10 gram monofilament bilaterally     DATA REVIEWED:  Lab Results  Component Value Date   HGBA1C 6.9 (A) 01/12/2024   HGBA1C 6.6 (A) 08/30/2023   HGBA1C 6.8 (H) 03/18/2023    Latest Reference Range & Units 03/18/23 09:45  Sodium 135 - 145 mEq/L 142  Potassium 3.5 - 5.1 mEq/L 4.7  Chloride 96 -  112 mEq/L 105  CO2 19 - 32 mEq/L 29  Glucose 70 - 99 mg/dL 161 (H)  BUN 6 - 23 mg/dL 13  Creatinine 0.96 - 0.45 mg/dL 4.09  Calcium  8.4 - 10.5 mg/dL 9.6  Alkaline Phosphatase 39 - 117 U/L 80  Albumin 3.5 - 5.2 g/dL 4.8  AST 0 - 37 U/L 18  ALT 0 - 53 U/L 13  Total Protein 6.0 - 8.3 g/dL 7.5  Total Bilirubin 0.2 - 1.2 mg/dL 1.1  GFR >81.19 mL/min 91.89  Total CHOL/HDL Ratio  4  Cholesterol 0 - 200 mg/dL 147 (H)  HDL Cholesterol >39.00 mg/dL 82.95  LDL (calc) 0 - 99 mg/dL 621 (H)  MICROALB/CREAT RATIO 0.0 - 30.0 mg/g 0.7  NonHDL  157.22  Triglycerides 0.0 - 149.0 mg/dL 308.6  VLDL 0.0 - 57.8 mg/dL 46.9    Latest Reference Range & Units 03/18/23 09:45  Creatinine,U mg/dL 629.5  Microalb, Ur 0.0 - 1.9 mg/dL <2.8  MICROALB/CREAT RATIO 0.0 - 30.0 mg/g 0.7     ASSESSMENT / PLAN / RECOMMENDATIONS:   1) Type 2 Diabetes Mellitus,   Optimally Controlled , Without  complications - Most recent A1c of 6.9 %. Goal A1c < 7.0 %.    - A1c remains at goal -His diarrhea resolved with switching metformin  to XR formulation - We entertain the idea of switching Rybelsus  to Mounjaro, but he is considering retirement and does not want any side effects, his wife is on Mounjaro and does have occasional GI side effects  MEDICATIONS:  -Continue Jardiance  25 mg daily  -Continue  Pioglitazone  45 mg, 1 tablet daily  -Continue Rybelsus  14 mg , 1 tablet with Breakfast  -Continue glipizide  5 mg, 1 tablet before breakfast and 1 tablet before supper  -Continue metformin  500 mg  XR , 2 tablet twice daily    EDUCATION / INSTRUCTIONS: BG monitoring instructions: Patient is  instructed to check his blood sugars 1 times a day, before breakfast Call [facility] if: BG persistently < 70 I reviewed the Rule of 15 for the treatment of hypoglycemia in detail with the patient. Literature supplied.    2) Diabetic complications:  Eye: Does not have known diabetic retinopathy.  Neuro/ Feet: Does not have known diabetic peripheral neuropathy .  Renal: Patient does not have known baseline CKD. He   is not on an ACEI/ARB at present.     3) Dyslipidemia:  -He is on atorvastatin  half a tablet daily due to leg cramps - His cramps have improved with decreasing atorvastatin  as well as taking OTC magnesium and potassium - I explained to the patient that his LDL is above goal, patient will try to go up to a full tablet daily of atorvastatin , if leg cramps resumed we will consider changing his medication  Medication Atorvastatin  20 mg daily  F/U in 4 months      Signed electronically by: Natale Bail, MD  [facility]  [facility] [address] [place] [place], [place] [address] Phone: [phone] FAX: [phone]   CC: Genia Kettering, MD [address] [place] [address] Phone: [phone]  Fax: [phone]  Return to Endocrinology clinic as below: Future Appointments  Date Time Provider Department Center  03/20/2024  7:50 AM Plotnikov, Oakley Bellman, MD [facility] None

## 2024-01-12 NOTE — Patient Instructions (Signed)
-   Continue  Jardiance  25 mg, 1 tablet every morning  - Continue Pioglitazone   45  mg, 1 tablet before breakfast  - Continue Glipizide  5 mg, to 1 tablet before Breakfast and 1 tablets before Supper - Continue Metformin  to 500 mg XR, 2 tablets with Breakfast and Supper  - Continue Rybelsus  14  mg , 1 tablet before  Breakfast    HOW TO TREAT LOW BLOOD SUGARS (Blood sugar LESS THAN 70 MG/DL) Please follow the RULE OF 15 for the treatment of hypoglycemia treatment (when your (blood sugars are less than 70 mg/dL)   STEP 1: Take 15 grams of carbohydrates when your blood sugar is low, which includes:  3-4 GLUCOSE TABS  OR 3-4 OZ OF JUICE OR REGULAR SODA OR ONE TUBE OF GLUCOSE GEL    STEP 2: RECHECK blood sugar in 15 MINUTES STEP 3: If your blood sugar is still low at the 15 minute recheck --> then, go back to STEP 1 and treat AGAIN with another 15 grams of carbohydrates.

## 2024-01-23 ENCOUNTER — Telehealth: Payer: Self-pay

## 2024-01-31 ENCOUNTER — Other Ambulatory Visit (HOSPITAL_COMMUNITY): Payer: Self-pay

## 2024-01-31 ENCOUNTER — Telehealth: Payer: Self-pay

## 2024-01-31 NOTE — Telephone Encounter (Signed)
 Pharmacy Patient Advocate Encounter   Received notification from CoverMyMeds that prior authorization for RYBELSUS  14 MG TABLETis required/requested.   Insurance verification completed.   The patient is insured through [employer] .   Per test claim: PA required; PA submitted to above mentioned insurance via Prompt PA Key/confirmation #/EOC [id number] Status is pending

## 2024-02-06 ENCOUNTER — Other Ambulatory Visit (HOSPITAL_COMMUNITY): Payer: Self-pay

## 2024-02-06 NOTE — Telephone Encounter (Signed)
 Pharmacy Patient Advocate Encounter  Received notification from [employer] that Prior Authorization for  RYBELSUS  14 MG TABLET  has been APPROVED from 02/03/24 to 08/15/2098. Unable to obtain price due to refill too soon rejection (available 03/15/2024)  PA #/Case ID/Reference #: [id number]

## 2024-02-06 NOTE — Telephone Encounter (Signed)
 ERROR

## 2024-03-19 ENCOUNTER — Other Ambulatory Visit (INDEPENDENT_AMBULATORY_CARE_PROVIDER_SITE_OTHER)

## 2024-03-19 DIAGNOSIS — Z0000 Encounter for general adult medical examination without abnormal findings: Secondary | ICD-10-CM | POA: Diagnosis not present

## 2024-03-19 DIAGNOSIS — I10 Essential (primary) hypertension: Secondary | ICD-10-CM

## 2024-03-19 LAB — LIPID PANEL
Cholesterol: 140 mg/dL (ref 0–200)
HDL: 40.7 mg/dL (ref >39.00)
LDL Cholesterol: 73 mg/dL (ref 0–99)
NonHDL: 99.02
Total CHOL/HDL Ratio: 3
Triglycerides: 131 mg/dL (ref 0.0–149.0)
VLDL: 26.2 mg/dL (ref 0.0–40.0)

## 2024-03-19 LAB — CBC WITH DIFFERENTIAL/PLATELET
Basophils Absolute: 0 K/uL (ref 0.0–0.1)
Basophils Relative: 0.4 % (ref 0.0–3.0)
Eosinophils Absolute: 0.1 K/uL (ref 0.0–0.7)
Eosinophils Relative: 1.6 % (ref 0.0–5.0)
HCT: 50.2 % (ref 39.0–52.0)
Hemoglobin: 16.8 g/dL (ref 13.0–17.0)
Lymphocytes Relative: 27.7 % (ref 12.0–46.0)
Lymphs Abs: 1.9 K/uL (ref 0.7–4.0)
MCHC: 33.4 g/dL (ref 30.0–36.0)
MCV: 87.3 fl (ref 78.0–100.0)
Monocytes Absolute: 0.4 K/uL (ref 0.1–1.0)
Monocytes Relative: 5.7 % (ref 3.0–12.0)
Neutro Abs: 4.5 K/uL (ref 1.4–7.7)
Neutrophils Relative %: 64.6 % (ref 43.0–77.0)
Platelets: 166 K/uL (ref 150.0–400.0)
RBC: 5.75 Mil/uL (ref 4.22–5.81)
RDW: 14 % (ref 11.5–15.5)
WBC: 6.9 K/uL (ref 4.0–10.5)

## 2024-03-19 LAB — COMPREHENSIVE METABOLIC PANEL WITH GFR
ALT: 10 U/L (ref 0–53)
AST: 15 U/L (ref 0–37)
Albumin: 4.6 g/dL (ref 3.5–5.2)
Alkaline Phosphatase: 81 U/L (ref 39–117)
BUN: 16 mg/dL (ref 6–23)
CO2: 28 meq/L (ref 19–32)
Calcium: 9.4 mg/dL (ref 8.4–10.5)
Chloride: 103 meq/L (ref 96–112)
Creatinine, Ser: 0.98 mg/dL (ref 0.40–1.50)
GFR: 81.25 mL/min (ref >60.00)
Glucose, Bld: 166 mg/dL — ABNORMAL HIGH (ref 70–99)
Potassium: 4.3 meq/L (ref 3.5–5.1)
Sodium: 142 meq/L (ref 135–145)
Total Bilirubin: 1 mg/dL (ref 0.2–1.2)
Total Protein: 7.1 g/dL (ref 6.0–8.3)

## 2024-03-19 LAB — PSA: PSA: 0.92 ng/mL (ref 0.10–4.00)

## 2024-03-19 LAB — URINALYSIS
Bilirubin Urine: NEGATIVE
Hgb urine dipstick: NEGATIVE
Ketones, ur: NEGATIVE
Leukocytes,Ua: NEGATIVE
Nitrite: NEGATIVE
Specific Gravity, Urine: 1.025 (ref 1.000–1.030)
Total Protein, Urine: NEGATIVE
Urine Glucose: >=1000 — AB
Urobilinogen, UA: 0.2 (ref 0.0–1.0)
pH: 5.5 (ref 5.0–8.0)

## 2024-03-19 LAB — TSH: TSH: 1.45 u[IU]/mL (ref 0.35–5.50)

## 2024-03-19 LAB — MICROALBUMIN / CREATININE URINE RATIO
Creatinine,U: 100.8 mg/dL
Microalb Creat Ratio: UNDETERMINED mg/g (ref 0.0–30.0)
Microalb, Ur: <0.7 mg/dL

## 2024-03-20 ENCOUNTER — Ambulatory Visit (INDEPENDENT_AMBULATORY_CARE_PROVIDER_SITE_OTHER): Payer: Managed Care, Other (non HMO) | Admitting: Internal Medicine

## 2024-03-20 ENCOUNTER — Ambulatory Visit: Payer: Self-pay | Admitting: Internal Medicine

## 2024-03-20 ENCOUNTER — Encounter: Payer: Self-pay | Admitting: Internal Medicine

## 2024-03-20 VITALS — BP 123/78 | HR 56 | Temp 97.9°F | Ht 69.0 in | Wt 235.0 lb

## 2024-03-20 DIAGNOSIS — E119 Type 2 diabetes mellitus without complications: Secondary | ICD-10-CM

## 2024-03-20 DIAGNOSIS — I10 Essential (primary) hypertension: Secondary | ICD-10-CM | POA: Diagnosis not present

## 2024-03-20 DIAGNOSIS — Z0000 Encounter for general adult medical examination without abnormal findings: Secondary | ICD-10-CM

## 2024-03-20 DIAGNOSIS — E785 Hyperlipidemia, unspecified: Secondary | ICD-10-CM

## 2024-03-20 DIAGNOSIS — I2583 Coronary atherosclerosis due to lipid rich plaque: Secondary | ICD-10-CM

## 2024-03-20 MED ORDER — BISOPROLOL-HYDROCHLOROTHIAZIDE 10-6.25 MG PO TABS: ORAL_TABLET | ORAL | 3 refills | Status: AC

## 2024-03-20 NOTE — Assessment & Plan Note (Signed)
2023 CT: Total Agatston score: 10.2  On Lipitor

## 2024-03-20 NOTE — Assessment & Plan Note (Signed)
 2023 CT: Total Agatston score: 10.2  On Rybelsus 

## 2024-03-20 NOTE — Progress Notes (Signed)
 Subjective:  Patient ID: Gary Fowler, male    DOB: 09/06/1960  Age: 63 y.o. MRN: [id number]  CC: No chief complaint on file.   HPI DMARIO RUSSOM presents for DM, HTN, dyslipidemia Planning to retire in 2026  Outpatient Medications Prior to Visit  Medication Sig Dispense Refill   aspirin 81 MG chewable tablet Chew 81 mg by mouth daily.     atorvastatin  (LIPITOR) 20 MG tablet TAKE 1 TABLET BY MOUTH ONCE DAILY AT  6PM 90 tablet 0   Blood Glucose Monitoring Suppl (ACCU-CHEK GUIDE ME) w/Device KIT 1 Device by Does not apply route daily in the afternoon. 1 kit 0   CALCIUM /MAGNESIUM/ZINC FORMULA PO Take by mouth.     cyclobenzaprine (FLEXERIL) 10 MG tablet Take 10 mg by mouth daily as needed.     diclofenac Sodium (VOLTAREN) 1 % GEL Apply 2 g topically 4 (four) times daily.     empagliflozin  (JARDIANCE ) 25 MG TABS tablet Take 1 tablet (25 mg total) by mouth daily before breakfast. 90 tablet 3   fluorouracil (EFUDEX) 5 % cream APPLY A THIN LAYER ON THE SKIN TWICE DAILY TO AFFECTED AREA FOR 2 WEEKS  0   glipiZIDE  (GLUCOTROL ) 5 MG tablet Take 1 tablet (5 mg total) by mouth 2 (two) times daily before a meal. 180 tablet 3   glucose blood (ACCU-CHEK GUIDE TEST) test strip 1 each by Other route daily in the afternoon. Use as instructed 100 each 3   HYDROcodone -acetaminophen  (NORCO/VICODIN) 5-325 MG tablet Take by mouth.     ibuprofen  (ADVIL ) 600 MG tablet Take 1 tablet (600 mg total) by mouth 2 (two) times daily as needed for moderate pain. for pain 180 tablet 0   meloxicam  (MOBIC ) 15 MG tablet Take 15 mg by mouth daily.     metFORMIN  (GLUCOPHAGE -XR) 500 MG 24 hr tablet Take 2 tablets (1,000 mg total) by mouth 2 (two) times daily. 360 tablet 3   Multiple Vitamin (MULTIVITAMIN) tablet Take 1 tablet by mouth daily.     mupirocin ointment (BACTROBAN) 2 % Apply topically 3 (three) times daily.     pioglitazone  (ACTOS ) 45 MG tablet Take 1 tablet (45 mg total) by mouth daily. 90 tablet 3    Semaglutide  (RYBELSUS ) 14 MG TABS Take 1 tablet (14 mg total) by mouth daily. 90 tablet 3   vardenafil  (LEVITRA ) 20 MG tablet Take 1 tablet (20 mg total) by mouth daily as needed for erectile dysfunction. 20 tablet 6   bisoprolol -hydrochlorothiazide  (ZIAC ) 10-6.25 MG tablet TAKE 1 TABLET BY MOUTH ONCE DAILY . APPOINTMENT REQUIRED FOR FUTURE REFILLS 90 tablet 3   No facility-administered medications prior to visit.    ROS: Review of Systems  Constitutional:  Negative for appetite change, fatigue and unexpected weight change.  HENT:  Negative for congestion, nosebleeds, sneezing, sore throat and trouble swallowing.   Eyes:  Negative for itching and visual disturbance.  Respiratory:  Negative for cough.   Cardiovascular:  Negative for chest pain, palpitations and leg swelling.  Gastrointestinal:  Negative for abdominal distention, blood in stool, diarrhea and nausea.  Genitourinary:  Negative for frequency and hematuria.  Musculoskeletal:  Negative for back pain, gait problem, joint swelling and neck pain.  Skin:  Negative for rash.  Neurological:  Negative for dizziness, tremors, speech difficulty and weakness.  Psychiatric/Behavioral:  Negative for agitation, dysphoric mood, sleep disturbance and suicidal ideas. The patient is not nervous/anxious.     Objective:  BP 123/78   Pulse (!) 56  Temp 97.9 F (36.6 C) (Oral)   Ht 5' 9 (1.753 m)   Wt 235 lb (106.6 kg)   SpO2 97%   BMI 34.70 kg/m   BP Readings from Last 3 Encounters:  03/20/24 123/78  01/12/24 134/80  09/21/23 118/78    Wt Readings from Last 3 Encounters:  03/20/24 235 lb (106.6 kg)  01/12/24 239 lb (108.4 kg)  09/21/23 242 lb (109.8 kg)    Physical Exam Constitutional:      General: He is not in acute distress.    Appearance: He is well-developed. He is obese.     Comments: NAD  Eyes:     Conjunctiva/sclera: Conjunctivae normal.     Pupils: Pupils are equal, round, and reactive to light.  Neck:      Thyroid : No thyromegaly.     Vascular: No JVD.  Cardiovascular:     Rate and Rhythm: Normal rate and regular rhythm.     Heart sounds: Normal heart sounds. No murmur heard.    No friction rub. No gallop.  Pulmonary:     Effort: Pulmonary effort is normal. No respiratory distress.     Breath sounds: Normal breath sounds. No wheezing or rales.  Chest:     Chest wall: No tenderness.  Abdominal:     General: Bowel sounds are normal. There is no distension.     Palpations: Abdomen is soft. There is no mass.     Tenderness: There is no abdominal tenderness. There is no guarding or rebound.  Musculoskeletal:        General: No tenderness. Normal range of motion.     Cervical back: Normal range of motion.  Lymphadenopathy:     Cervical: No cervical adenopathy.  Skin:    General: Skin is warm and dry.     Findings: No rash.  Neurological:     Mental Status: He is alert and oriented to person, place, and time.     Cranial Nerves: No cranial nerve deficit.     Motor: No abnormal muscle tone.     Coordination: Coordination normal.     Gait: Gait normal.     Deep Tendon Reflexes: Reflexes are normal and symmetric.  Psychiatric:        Behavior: Behavior normal.        Thought Content: Thought content normal.        Judgment: Judgment normal.     Lab Results  Component Value Date   WBC 6.9 03/19/2024   HGB 16.8 03/19/2024   HCT 50.2 03/19/2024   PLT 166.0 03/19/2024   GLUCOSE 166 (H) 03/19/2024   CHOL 140 03/19/2024   TRIG 131.0 03/19/2024   HDL 40.70 03/19/2024   LDLDIRECT 56.3 11/20/2012   LDLCALC 73 03/19/2024   ALT 10 03/19/2024   AST 15 03/19/2024   NA 142 03/19/2024   K 4.3 03/19/2024   CL 103 03/19/2024   CREATININE 0.98 03/19/2024   BUN 16 03/19/2024   CO2 28 03/19/2024   TSH 1.45 03/19/2024   PSA 0.92 03/19/2024   HGBA1C 6.9 (A) 01/12/2024   MICROALBUR <0.7 03/19/2024    No results found.  Assessment & Plan:   Problem List Items Addressed This Visit      Coronary atherosclerosis   On Lipitor and aspirin      Relevant Medications   bisoprolol -hydrochlorothiazide  (ZIAC ) 10-6.25 MG tablet   Dyslipidemia   2023 CT: Total Agatston score: 10.2  On Lipitor       Essential hypertension  Chronic On Bisoprolol  HCT       Relevant Medications   bisoprolol -hydrochlorothiazide  (ZIAC ) 10-6.25 MG tablet   Type 2 diabetes mellitus without complication, without long-term current use of insulin (HCC) - Primary   2023 CT: Total Agatston score: 10.2  On Rybelsus       Relevant Medications   bisoprolol -hydrochlorothiazide  (ZIAC ) 10-6.25 MG tablet   Other Relevant Orders   Microalbumin / creatinine urine ratio   Hemoglobin A1c   Well adult exam   Relevant Orders   TSH   Urinalysis   CBC with Differential/Platelet   Lipid panel   PSA   Comprehensive metabolic panel with GFR   Microalbumin / creatinine urine ratio   Hemoglobin A1c      Meds ordered this encounter  Medications   bisoprolol -hydrochlorothiazide  (ZIAC ) 10-6.25 MG tablet    Sig: TAKE 1 TABLET BY MOUTH ONCE DAILY . APPOINTMENT REQUIRED FOR FUTURE REFILLS    Dispense:  90 tablet    Refill:  3      Follow-up: No follow-ups on file.  Marolyn Noel, MD

## 2024-03-20 NOTE — Assessment & Plan Note (Signed)
On Lipitor and aspirin

## 2024-03-20 NOTE — Assessment & Plan Note (Signed)
 Chronic On Bisoprolol  HCT

## 2024-03-26 ENCOUNTER — Other Ambulatory Visit: Payer: Self-pay | Admitting: Internal Medicine

## 2024-06-23 ENCOUNTER — Other Ambulatory Visit: Payer: Self-pay | Admitting: Internal Medicine

## 2024-07-17 ENCOUNTER — Ambulatory Visit: Admitting: Internal Medicine

## 2024-08-24 ENCOUNTER — Encounter: Payer: Self-pay | Admitting: Internal Medicine

## 2024-08-24 ENCOUNTER — Ambulatory Visit: Admitting: Internal Medicine

## 2024-08-24 VITALS — BP 122/80 | Ht 69.0 in | Wt 235.0 lb

## 2024-08-24 DIAGNOSIS — Z7985 Long-term (current) use of injectable non-insulin antidiabetic drugs: Secondary | ICD-10-CM

## 2024-08-24 DIAGNOSIS — Z7984 Long term (current) use of oral hypoglycemic drugs: Secondary | ICD-10-CM | POA: Diagnosis not present

## 2024-08-24 DIAGNOSIS — E1165 Type 2 diabetes mellitus with hyperglycemia: Secondary | ICD-10-CM | POA: Diagnosis not present

## 2024-08-24 LAB — POCT GLYCOSYLATED HEMOGLOBIN (HGB A1C): Hemoglobin A1C: 7.4 % — AB (ref 4.0–5.6)

## 2024-08-24 MED ORDER — GLIPIZIDE 5 MG PO TABS: 5.0000 mg | ORAL_TABLET | Freq: Two times a day (BID) | ORAL | 3 refills | Status: AC

## 2024-08-24 MED ORDER — METFORMIN HCL ER 500 MG PO TB24: 1000.0000 mg | ORAL_TABLET | Freq: Every day | ORAL | 3 refills | Status: AC

## 2024-08-24 MED ORDER — PIOGLITAZONE HCL 45 MG PO TABS: 45.0000 mg | ORAL_TABLET | Freq: Every day | ORAL | 3 refills | Status: AC

## 2024-08-24 MED ORDER — TIRZEPATIDE 5 MG/0.5ML ~~LOC~~ SOAJ: 5.0000 mg | SUBCUTANEOUS | 3 refills | Status: AC

## 2024-08-24 MED ORDER — EMPAGLIFLOZIN 25 MG PO TABS: 25.0000 mg | ORAL_TABLET | Freq: Every day | ORAL | 3 refills | Status: AC

## 2024-08-24 NOTE — Progress Notes (Signed)
 "   Name: Gary Fowler  Age/ Sex: 64 y.o., male   MRN/ DOB: [id number], 08-Jun-1961     PCP: Garald Karlynn GAILS, MD   Reason for Endocrinology Evaluation: Type 2 Diabetes Mellitus  Initial Endocrine Consultative Visit: 12/07/2019    PATIENT IDENTIFIER: Gary Fowler is a 64 y.o. male with a past medical history of HTN, T2DM, and dyslipidemia. The patient has followed with Endocrinology clinic since 12/07/2019 for consultative assistance with management of his diabetes.    DIABETIC HISTORY:  Gary Fowler was diagnosed with T2DM in 2008, . His hemoglobin A1c has ranged from 6.3% in 2016, peaking at 8.5% in 2021.  On his initial visit to our clinic his A1c was 8.5%. He was on Metformin , Glimepiride  and Oseni .We switched Glimepiride  to Glipizide  and continued metformin  and Oseni .   By 02/2020 we switched Oseni  to Pioglitazone  and Rybelsus    Testosterone checked due to complaints of ED 06/2020 with normal testosterone at 367 ng/dL    Jardiance  started 08/2022  SUBJECTIVE:   During the last visit (12/23/2023): A1c 6.9%.    Today (08/24/2024): Gary Fowler is here for a follow up on diabetes management.  He has been checking glucose occasionally  The pt has NOT been to our clinic in 8 months.  Patient has been no weight loss He is having diarrhea with taking Metformin  , up to 2x daily at times worse at night  No nausea  No LE edema  Spouse is on Mounjaro  and has lost approximately and 100 LB's  HOME DIABETES REGIMEN:  Metformin  500 mg XR, 2 tabs  BID  Glipizide  5 mg , 1 tabs BID Pioglitazone  45 mg, 1 tablet daily  Rybelsus  14 mg daily Jardiance  25 mg daily     Statin: yes ACE-I/ARB: no    METER DOWNLOAD SUMMARY: 12/26-08/24/2024  Average Number Tests/Day = 0.2 Overall Mean FS Glucose = 155  BG Ranges: Low = 119 High = 177  BG Target % Results: % In target = 100 % Over target = 0 % Under target = 0  Hypoglycemic Events/30 Days: BG < 50 = 0 Episodes  of symptomatic severe hypoglycemia = 0   DIABETIC COMPLICATIONS: Microvascular complications:    Denies: CKD, retinopapthy , neuropathy  Last eye exam: Completed  02/2024   Macrovascular complications:    Denies: CAD, PVD, CVA    HISTORY:  Past Medical History:  Past Medical History:  Diagnosis Date   ED (erectile dysfunction)    Family history of prostate cancer    Hyperlipidemia    Hypertension    Insomnia    LBP (low back pain)    Mood swings    OSA (obstructive sleep apnea)    Skin cancer of nose 2008   Left   Type II or unspecified type diabetes mellitus without mention of complication, not stated as uncontrolled    Past Surgical History:  Past Surgical History:  Procedure Laterality Date   arm fracture     with plate   ARTHROSCOPY KNEE W/ DRILLING     rt. knee   bone spur removal     lt. shoulder   LUMBAR FUSION     LUMBAR LAMINECTOMY     SKIN CANCER EXCISION     face   Social History:  reports that he has quit smoking. His smokeless tobacco use includes chew. He reports that he does not drink alcohol and does not use drugs. Family History:  Family History  Problem Relation Age  of Onset   Cancer Father        Melanoma   Hypertension Mother    Crohn's disease Paternal Uncle    Diabetes Maternal Grandfather    Diabetes Paternal Grandfather      HOME MEDICATIONS: Allergies as of 08/24/2024       Reactions   Morphine Sulfate    REACTION: headache        Medication List        Accurate as of August 24, 2024  8:29 AM. If you have any questions, ask your nurse or doctor.          Accu-Chek Guide Me w/Device Kit 1 Device by Does not apply route daily in the afternoon.   Accu-Chek Guide Test test strip Generic drug: glucose blood 1 each by Other route daily in the afternoon. Use as instructed   aspirin 81 MG chewable tablet Chew 81 mg by mouth daily.   atorvastatin  20 MG tablet Commonly known as: LIPITOR TAKE 1 TABLET BY MOUTH ONCE  DAILY AT  6PM   bisoprolol -hydrochlorothiazide  10-6.25 MG tablet Commonly known as: ZIAC  TAKE 1 TABLET BY MOUTH ONCE DAILY . APPOINTMENT REQUIRED FOR FUTURE REFILLS   CALCIUM /MAGNESIUM/ZINC FORMULA PO Take by mouth.   cyclobenzaprine 10 MG tablet Commonly known as: FLEXERIL Take 10 mg by mouth daily as needed.   diclofenac Sodium 1 % Gel Commonly known as: VOLTAREN Apply 2 g topically 4 (four) times daily.   empagliflozin  25 MG Tabs tablet Commonly known as: Jardiance  Take 1 tablet (25 mg total) by mouth daily before breakfast.   fluorouracil 5 % cream Commonly known as: EFUDEX APPLY A THIN LAYER ON THE SKIN TWICE DAILY TO AFFECTED AREA FOR 2 WEEKS   glipiZIDE  5 MG tablet Commonly known as: GLUCOTROL  Take 1 tablet (5 mg total) by mouth 2 (two) times daily before a meal.   HYDROcodone -acetaminophen  5-325 MG tablet Commonly known as: NORCO/VICODIN Take by mouth.   ibuprofen  600 MG tablet Commonly known as: ADVIL  Take 1 tablet (600 mg total) by mouth 2 (two) times daily as needed for moderate pain. for pain   meloxicam  15 MG tablet Commonly known as: MOBIC  Take 15 mg by mouth daily.   metFORMIN  500 MG 24 hr tablet Commonly known as: GLUCOPHAGE -XR Take 2 tablets (1,000 mg total) by mouth 2 (two) times daily.   multivitamin tablet Take 1 tablet by mouth daily.   mupirocin ointment 2 % Commonly known as: BACTROBAN Apply topically 3 (three) times daily.   pioglitazone  45 MG tablet Commonly known as: Actos  Take 1 tablet (45 mg total) by mouth daily.   Rybelsus  14 MG Tabs Generic drug: Semaglutide  Take 1 tablet (14 mg total) by mouth daily.   vardenafil  20 MG tablet Commonly known as: Levitra  Take 1 tablet (20 mg total) by mouth daily as needed for erectile dysfunction.         OBJECTIVE:   Vital Signs: BP 122/80   Ht 5' 9 (1.753 m)   Wt 235 lb (106.6 kg)   BMI 34.70 kg/m   Wt Readings from Last 3 Encounters:  08/24/24 235 lb (106.6 kg)  03/20/24  235 lb (106.6 kg)  01/12/24 239 lb (108.4 kg)     Exam: General: Pt appears well and is in NAD  Lungs: Clear with good BS bilat   Heart: RRR with normal   Extremities: No pretibial edema.  Neuro: MS is good with appropriate affect, pt is alert and Ox3    DM  foot exam:  01/12/2024   The skin of the feet is intact without sores or ulcerations. The pedal pulses are +1 on right and 1+ on left. The sensation is intact to a screening 5.07, 10 gram monofilament bilaterally     DATA REVIEWED:  Lab Results  Component Value Date   HGBA1C 7.4 (A) 08/24/2024   HGBA1C 6.9 (A) 01/12/2024   HGBA1C 6.6 (A) 08/30/2023     Latest Reference Range & Units 03/19/24 07:51  Comprehensive metabolic panel with GFR  Rpt !  Sodium 135 - 145 mEq/L 142  Potassium 3.5 - 5.1 mEq/L 4.3  Chloride 96 - 112 mEq/L 103  CO2 19 - 32 mEq/L 28  Glucose 70 - 99 mg/dL 833 (H)  BUN 6 - 23 mg/dL 16  Creatinine 9.59 - 8.49 mg/dL 9.01  Calcium  8.4 - 10.5 mg/dL 9.4  Alkaline Phosphatase 39 - 117 U/L 81  Albumin 3.5 - 5.2 g/dL 4.6  AST 0 - 37 U/L 15  ALT 0 - 53 U/L 10  Total Protein 6.0 - 8.3 g/dL 7.1  Total Bilirubin 0.2 - 1.2 mg/dL 1.0  GFR >39.99 mL/min 81.25  Total CHOL/HDL Ratio  3  Cholesterol 0 - 200 mg/dL 859  HDL Cholesterol >60.99 mg/dL 59.29  LDL (calc) 0 - 99 mg/dL 73  MICROALB/CREAT RATIO 0.0 - 30.0 mg/g Unable to calculate  NonHDL  99.02  Triglycerides 0.0 - 149.0 mg/dL 868.9  VLDL 0.0 - 59.9 mg/dL 73.7    Latest Reference Range & Units 03/19/24 07:51  Creatinine,U mg/dL 899.1  Microalb, Ur mg/dL <9.2  MICROALB/CREAT RATIO 0.0 - 30.0 mg/g Unable to calculate     ASSESSMENT / PLAN / RECOMMENDATIONS:   1) Type 2 Diabetes Mellitus,  Sub- Optimally Controlled , Without  complications - Most recent A1c of 7.4 %. Goal A1c < 7.0 %.    - A1c has trended up, patient attributes this to dietary indiscretions during the holiday -Patient continues to complain of diarrhea with metformin  intake,  will decrease the dose by 50% - We have discussed changing Rybelsus  to Mounjaro , cautioned against GI side effects -Also cautioned the patient regarding the risk of hypoglycemia with improved insulin sensitivity on Mounjaro , patient encouraged to contact our office with hypoglycemic episodes  MEDICATIONS:  -Stop Rybelsus  -Decrease metformin  500 mg XR, 2 tablets daily -Start Mounjaro  5 mg weekly -Continue Jardiance  25 mg daily  -Continue  Pioglitazone  45 mg, 1 tablet daily  -Continue glipizide  5 mg, 1 tablet before breakfast and 1 tablet before supper     EDUCATION / INSTRUCTIONS: BG monitoring instructions: Patient is instructed to check his blood sugars 1 times a day, before breakfast Call [facility] if: BG persistently < 70 I reviewed the Rule of 15 for the treatment of hypoglycemia in detail with the patient. Literature supplied.    2) Diabetic complications:  Eye: Does not have known diabetic retinopathy.  Neuro/ Feet: Does not have known diabetic peripheral neuropathy .  Renal: Patient does not have known baseline CKD. He   is not on an ACEI/ARB at present.    F/U in 3 months      Signed electronically by: Stefano Redgie Butts, MD  [facility] Endocrinology  [facility] [address] [place], [place] [address] Phone: [phone] FAX: [phone]   CC: Garald Karlynn GAILS, MD [address] [place] [address] Phone: [phone]  Fax: [phone]  Return to Endocrinology clinic as below: Future Appointments  Date Time  Provider Department Center  08/24/2024  8:30 AM Shaynna Husby, Donell Cardinal, MD [facility] None  09/19/2024  7:50 AM Plotnikov, Karlynn GAILS, MD [facility]      "

## 2024-08-24 NOTE — Patient Instructions (Addendum)
-   Decrease Metformin  500 mg XR , 2 tablets daily  - STOP Rybelsus   - Start Mounjaro  5 mg weekly  - Continue  Jardiance  25 mg, 1 tablet every morning  - Continue Pioglitazone   45  mg, 1 tablet before breakfast  - Continue Glipizide  5 mg, to 1 tablet before Breakfast and 1 tablets before Supper  HOW TO TREAT LOW BLOOD SUGARS (Blood sugar LESS THAN 70 MG/DL) Please follow the RULE OF 15 for the treatment of hypoglycemia treatment (when your (blood sugars are less than 70 mg/dL)   STEP 1: Take 15 grams of carbohydrates when your blood sugar is low, which includes:  3-4 GLUCOSE TABS  OR 3-4 OZ OF JUICE OR REGULAR SODA OR ONE TUBE OF GLUCOSE GEL    STEP 2: RECHECK blood sugar in 15 MINUTES STEP 3: If your blood sugar is still low at the 15 minute recheck --> then, go back to STEP 1 and treat AGAIN with another 15 grams of carbohydrates.

## 2024-09-03 ENCOUNTER — Telehealth: Payer: Self-pay

## 2024-09-03 ENCOUNTER — Other Ambulatory Visit (HOSPITAL_COMMUNITY): Payer: Self-pay

## 2024-09-03 NOTE — Telephone Encounter (Signed)
 Pharmacy Patient Advocate Encounter   Received notification from Pt Calls Messages that prior authorization for Mounjaro  5MG /0.5ML auto-injectors is required/requested.   Insurance verification completed.   The patient is insured through [employer].   Per test claim: PA required; PA submitted to above mentioned insurance via Latent Key/confirmation #/EOC [id number] Status is pending   PA HAS BEEN APPROVED  PA Case: [id number]  Coverage Starts on: 08/16/2024  Coverage Ends on: 08/15/2025

## 2024-09-03 NOTE — Telephone Encounter (Signed)
 Spouse left message stating insurance states he needs a PA or change in medication in order to cover. Currently Mounjaro  not covered per insurance

## 2024-09-12 ENCOUNTER — Other Ambulatory Visit (INDEPENDENT_AMBULATORY_CARE_PROVIDER_SITE_OTHER)

## 2024-09-12 DIAGNOSIS — E119 Type 2 diabetes mellitus without complications: Secondary | ICD-10-CM | POA: Diagnosis not present

## 2024-09-12 DIAGNOSIS — Z0000 Encounter for general adult medical examination without abnormal findings: Secondary | ICD-10-CM | POA: Diagnosis not present

## 2024-09-12 LAB — COMPREHENSIVE METABOLIC PANEL WITH GFR
ALT: 10 U/L (ref 3–53)
AST: 16 U/L (ref 5–37)
Albumin: 4.4 g/dL (ref 3.5–5.2)
Alkaline Phosphatase: 75 U/L (ref 39–117)
BUN: 12 mg/dL (ref 6–23)
CO2: 28 meq/L (ref 19–32)
Calcium: 9.2 mg/dL (ref 8.4–10.5)
Chloride: 105 meq/L (ref 96–112)
Creatinine, Ser: 0.95 mg/dL (ref 0.40–1.50)
GFR: 84.06 mL/min
Glucose, Bld: 149 mg/dL — ABNORMAL HIGH (ref 70–99)
Potassium: 4.3 meq/L (ref 3.5–5.1)
Sodium: 142 meq/L (ref 135–145)
Total Bilirubin: 0.9 mg/dL (ref 0.2–1.2)
Total Protein: 6.8 g/dL (ref 6.0–8.3)

## 2024-09-12 LAB — CBC WITH DIFFERENTIAL/PLATELET
Basophils Absolute: 0 10*3/uL (ref 0.0–0.1)
Basophils Relative: 0.3 % (ref 0.0–3.0)
Eosinophils Absolute: 0.2 10*3/uL (ref 0.0–0.7)
Eosinophils Relative: 2.3 % (ref 0.0–5.0)
HCT: 46.2 % (ref 39.0–52.0)
Hemoglobin: 15.7 g/dL (ref 13.0–17.0)
Lymphocytes Relative: 20.4 % (ref 12.0–46.0)
Lymphs Abs: 1.6 10*3/uL (ref 0.7–4.0)
MCHC: 34 g/dL (ref 30.0–36.0)
MCV: 87 fl (ref 78.0–100.0)
Monocytes Absolute: 0.4 10*3/uL (ref 0.1–1.0)
Monocytes Relative: 5.1 % (ref 3.0–12.0)
Neutro Abs: 5.6 10*3/uL (ref 1.4–7.7)
Neutrophils Relative %: 71.9 % (ref 43.0–77.0)
Platelets: 163 10*3/uL (ref 150.0–400.0)
RBC: 5.31 Mil/uL (ref 4.22–5.81)
RDW: 13.5 % (ref 11.5–15.5)
WBC: 7.8 10*3/uL (ref 4.0–10.5)

## 2024-09-12 LAB — URINALYSIS
Bilirubin Urine: NEGATIVE
Hgb urine dipstick: NEGATIVE
Ketones, ur: NEGATIVE
Leukocytes,Ua: NEGATIVE
Nitrite: NEGATIVE
Specific Gravity, Urine: 1.02 (ref 1.000–1.030)
Total Protein, Urine: NEGATIVE
Urine Glucose: >=1000 — AB
Urobilinogen, UA: 0.2 (ref 0.0–1.0)
pH: 6 (ref 5.0–8.0)

## 2024-09-12 LAB — MICROALBUMIN / CREATININE URINE RATIO
Creatinine,U: 96.6 mg/dL
Microalb Creat Ratio: UNDETERMINED mg/g (ref 0.0–30.0)
Microalb, Ur: <0.7 mg/dL

## 2024-09-12 LAB — LIPID PANEL
Cholesterol: 99 mg/dL (ref 28–200)
HDL: 36 mg/dL — ABNORMAL LOW
LDL Cholesterol: 42 mg/dL (ref 10–99)
NonHDL: 63.28
Total CHOL/HDL Ratio: 3
Triglycerides: 105 mg/dL (ref 10.0–149.0)
VLDL: 21 mg/dL (ref 0.0–40.0)

## 2024-09-12 LAB — TSH: TSH: 1.31 u[IU]/mL (ref 0.35–5.50)

## 2024-09-12 LAB — PSA: PSA: 0.92 ng/mL (ref 0.10–4.00)

## 2024-09-12 LAB — HEMOGLOBIN A1C: Hgb A1c MFr Bld: 7.6 % — ABNORMAL HIGH (ref 4.6–6.5)

## 2024-09-17 ENCOUNTER — Ambulatory Visit: Payer: Self-pay | Admitting: Internal Medicine

## 2024-09-19 ENCOUNTER — Ambulatory Visit: Admitting: Internal Medicine

## 2024-09-19 ENCOUNTER — Encounter: Payer: Self-pay | Admitting: Internal Medicine

## 2024-09-19 VITALS — BP 126/82 | HR 63 | Ht 69.0 in | Wt 234.0 lb

## 2024-09-19 DIAGNOSIS — I700 Atherosclerosis of aorta: Secondary | ICD-10-CM

## 2024-09-19 DIAGNOSIS — E785 Hyperlipidemia, unspecified: Secondary | ICD-10-CM

## 2024-09-19 DIAGNOSIS — E1165 Type 2 diabetes mellitus with hyperglycemia: Secondary | ICD-10-CM

## 2024-09-19 DIAGNOSIS — I10 Essential (primary) hypertension: Secondary | ICD-10-CM

## 2024-09-19 DIAGNOSIS — Z0000 Encounter for general adult medical examination without abnormal findings: Secondary | ICD-10-CM

## 2024-09-19 MED ORDER — IBUPROFEN 600 MG PO TABS: 600.0000 mg | ORAL_TABLET | Freq: Two times a day (BID) | ORAL | 0 refills | Status: AC | PRN

## 2024-09-19 NOTE — Assessment & Plan Note (Signed)
 Chronic On Bisoprolol  HCT

## 2024-09-19 NOTE — Assessment & Plan Note (Signed)
2023 CT: Total Agatston score: 10.2  On Lipitor

## 2024-09-19 NOTE — Assessment & Plan Note (Signed)
 07/2022 coronary calcium  CT score is 10.2 and aortic atherosclerosis.  On Lipitor and aspirin

## 2024-09-19 NOTE — Progress Notes (Signed)
 Hello hey how are you to provide this no I okay okay in the morning very little right yeah at [employer] yeah 1 twice daily doing yeah  Subjective:  Patient ID: Gary Fowler, male    DOB: May 08, 1961  Age: 64 y.o. MRN: [id number]  CC: Medical Management of Chronic Issues (6 Month follow up)   HPI Phong D Lawhorn presents for a 6 months f/u - DM, HTN, dyslipidemia  Outpatient Medications Prior to Visit  Medication Sig Dispense Refill   aspirin 81 MG chewable tablet Chew 81 mg by mouth daily.     atorvastatin  (LIPITOR) 20 MG tablet TAKE 1 TABLET BY MOUTH ONCE DAILY AT  6PM 90 tablet 3   bisoprolol -hydrochlorothiazide  (ZIAC ) 10-6.25 MG tablet TAKE 1 TABLET BY MOUTH ONCE DAILY . APPOINTMENT REQUIRED FOR FUTURE REFILLS 90 tablet 3   Blood Glucose Monitoring Suppl (ACCU-CHEK GUIDE ME) w/Device KIT 1 Device by Does not apply route daily in the afternoon. 1 kit 0   CALCIUM /MAGNESIUM/ZINC FORMULA PO Take by mouth.     cyclobenzaprine (FLEXERIL) 10 MG tablet Take 10 mg by mouth daily as needed.     diclofenac Sodium (VOLTAREN) 1 % GEL Apply 2 g topically 4 (four) times daily.     empagliflozin  (JARDIANCE ) 25 MG TABS tablet Take 1 tablet (25 mg total) by mouth daily before breakfast. 90 tablet 3   fluorouracil (EFUDEX) 5 % cream APPLY A THIN LAYER ON THE SKIN TWICE DAILY TO AFFECTED AREA FOR 2 WEEKS  0   glipiZIDE  (GLUCOTROL ) 5 MG tablet Take 1 tablet (5 mg total) by mouth 2 (two) times daily before a meal. 180 tablet 3   glucose blood (ACCU-CHEK GUIDE TEST) test strip 1 each by Other route daily in the afternoon. Use as instructed 100 each 3   HYDROcodone -acetaminophen  (NORCO/VICODIN) 5-325 MG tablet Take by mouth.     meloxicam  (MOBIC ) 15 MG tablet Take 15 mg by mouth daily.     metFORMIN  (GLUCOPHAGE -XR) 500 MG 24 hr tablet Take 2 tablets (1,000 mg total) by mouth daily with breakfast. 180 tablet 3   Multiple Vitamin (MULTIVITAMIN) tablet Take 1 tablet by mouth daily.     mupirocin  ointment (BACTROBAN) 2 % Apply topically 3 (three) times daily.     pioglitazone  (ACTOS ) 45 MG tablet Take 1 tablet (45 mg total) by mouth daily. 90 tablet 3   tirzepatide  (MOUNJARO ) 5 MG/0.5ML Pen Inject 5 mg into the skin once a week. 6 mL 3   vardenafil  (LEVITRA ) 20 MG tablet Take 1 tablet (20 mg total) by mouth daily as needed for erectile dysfunction. 20 tablet 6   ibuprofen  (ADVIL ) 600 MG tablet Take 1 tablet (600 mg total) by mouth 2 (two) times daily as needed for moderate pain. for pain 180 tablet 0   No facility-administered medications prior to visit.    ROS: Review of Systems  Constitutional:  Negative for appetite change, fatigue and unexpected weight change.  HENT:  Negative for congestion, nosebleeds, sneezing, sore throat and trouble swallowing.   Eyes:  Negative for itching and visual disturbance.  Respiratory:  Negative for cough.   Cardiovascular:  Negative for chest pain, palpitations and leg swelling.  Gastrointestinal:  Negative for abdominal distention, blood in stool, diarrhea and nausea.  Genitourinary:  Negative for frequency and hematuria.  Musculoskeletal:  Negative for back pain, gait problem, joint swelling and neck pain.  Skin:  Negative for rash.  Neurological:  Negative for dizziness, tremors, speech difficulty and  weakness.  Psychiatric/Behavioral:  Negative for agitation, dysphoric mood and sleep disturbance. The patient is not nervous/anxious.     Objective:  BP 126/82   Pulse 63   Ht 5' 9 (1.753 m)   Wt 234 lb (106.1 kg)   SpO2 95%   BMI 34.56 kg/m   BP Readings from Last 3 Encounters:  09/19/24 126/82  08/24/24 122/80  03/20/24 123/78    Wt Readings from Last 3 Encounters:  09/19/24 234 lb (106.1 kg)  08/24/24 235 lb (106.6 kg)  03/20/24 235 lb (106.6 kg)    Physical Exam Constitutional:      General: He is not in acute distress.    Appearance: He is well-developed.     Comments: NAD  Eyes:     Conjunctiva/sclera: Conjunctivae  normal.     Pupils: Pupils are equal, round, and reactive to light.  Neck:     Thyroid : No thyromegaly.     Vascular: No JVD.  Cardiovascular:     Rate and Rhythm: Normal rate and regular rhythm.     Heart sounds: Normal heart sounds. No murmur heard.    No friction rub. No gallop.  Pulmonary:     Effort: Pulmonary effort is normal. No respiratory distress.     Breath sounds: Normal breath sounds. No wheezing or rales.  Chest:     Chest wall: No tenderness.  Abdominal:     General: Bowel sounds are normal. There is no distension.     Palpations: Abdomen is soft. There is no mass.     Tenderness: There is no abdominal tenderness. There is no guarding or rebound.  Musculoskeletal:        General: No tenderness. Normal range of motion.     Cervical back: Normal range of motion.  Lymphadenopathy:     Cervical: No cervical adenopathy.  Skin:    General: Skin is warm and dry.     Findings: No rash.  Neurological:     Mental Status: He is alert and oriented to person, place, and time.     Cranial Nerves: No cranial nerve deficit.     Motor: No abnormal muscle tone.     Coordination: Coordination normal.     Gait: Gait normal.     Deep Tendon Reflexes: Reflexes are normal and symmetric.  Psychiatric:        Behavior: Behavior normal.        Thought Content: Thought content normal.        Judgment: Judgment normal.    Face w/5-FU treatments erythema   Lab Results  Component Value Date   WBC 7.8 09/12/2024   HGB 15.7 09/12/2024   HCT 46.2 09/12/2024   PLT 163.0 09/12/2024   GLUCOSE 149 (H) 09/12/2024   CHOL 99 09/12/2024   TRIG 105.0 09/12/2024   HDL 36.00 (L) 09/12/2024   LDLDIRECT 56.3 11/20/2012   LDLCALC 42 09/12/2024   ALT 10 09/12/2024   AST 16 09/12/2024   NA 142 09/12/2024   K 4.3 09/12/2024   CL 105 09/12/2024   CREATININE 0.95 09/12/2024   BUN 12 09/12/2024   CO2 28 09/12/2024   TSH 1.31 09/12/2024   PSA 0.92 09/12/2024   HGBA1C 7.6 (H) 09/12/2024    MICROALBUR <0.7 09/12/2024    No results found.  Assessment & Plan:   Problem List Items Addressed This Visit     Dyslipidemia   2023 CT: Total Agatston score: 10.2  On Lipitor  Relevant Orders   TSH   Urinalysis   CBC with Differential/Platelet   Lipid panel   PSA   Comprehensive metabolic panel with GFR   Hemoglobin A1c   Microalbumin / creatinine urine ratio   Essential hypertension   Chronic On Bisoprolol  HCT       Well adult exam   Relevant Orders   TSH   Urinalysis   CBC with Differential/Platelet   Lipid panel   PSA   Comprehensive metabolic panel with GFR   Type 2 diabetes mellitus with hyperglycemia, without long-term current use of insulin (HCC) - Primary   F/u w/Dr Shamleffer Off Rybelsus  On Metformin , Jardiance , Glucotrol , Acos On Mounjaro  now      Relevant Orders   TSH   Urinalysis   CBC with Differential/Platelet   Lipid panel   PSA   Comprehensive metabolic panel with GFR   Hemoglobin A1c   Microalbumin / creatinine urine ratio   Atherosclerosis of aorta   07/2022 coronary calcium  CT score is 10.2 and aortic atherosclerosis.  On Lipitor and aspirin         Meds ordered this encounter  Medications   ibuprofen  (ADVIL ) 600 MG tablet    Sig: Take 1 tablet (600 mg total) by mouth 2 (two) times daily as needed for moderate pain (pain score 4-6). for pain    Dispense:  180 tablet    Refill:  0      Follow-up: Return in about 6 weeks (around 10/31/2024) for Wellness Exam.  Marolyn Noel, MD

## 2024-09-19 NOTE — Assessment & Plan Note (Addendum)
 F/u w/Dr Shamleffer Off Rybelsus  On Metformin , Jardiance , Glucotrol , Acos On Mounjaro  now

## 2024-11-30 ENCOUNTER — Ambulatory Visit: Admitting: Internal Medicine

## 2025-03-19 ENCOUNTER — Ambulatory Visit: Admitting: Internal Medicine
